# Patient Record
Sex: Male | Born: 1945
Health system: Southern US, Community
[De-identification: ages and names within clinical notes are randomized; demographics above are authoritative.]

## PROBLEM LIST (undated history)

## (undated) DIAGNOSIS — M1990 Unspecified osteoarthritis, unspecified site: Secondary | ICD-10-CM

## (undated) DIAGNOSIS — R042 Hemoptysis: Secondary | ICD-10-CM

## (undated) DIAGNOSIS — J9859 Other diseases of mediastinum, not elsewhere classified: Secondary | ICD-10-CM

## (undated) DIAGNOSIS — B1920 Unspecified viral hepatitis C without hepatic coma: Secondary | ICD-10-CM

## (undated) DIAGNOSIS — I10 Essential (primary) hypertension: Secondary | ICD-10-CM

## (undated) DIAGNOSIS — K219 Gastro-esophageal reflux disease without esophagitis: Secondary | ICD-10-CM

## (undated) DIAGNOSIS — C801 Malignant (primary) neoplasm, unspecified: Secondary | ICD-10-CM

## (undated) DIAGNOSIS — D500 Iron deficiency anemia secondary to blood loss (chronic): Secondary | ICD-10-CM

## (undated) DIAGNOSIS — Z7189 Other specified counseling: Secondary | ICD-10-CM

## (undated) HISTORY — DX: Iron deficiency anemia secondary to blood loss (chronic): D50.0

## (undated) HISTORY — DX: Hypercalcemia: E83.52

## (undated) HISTORY — DX: Other specified counseling: Z71.89

## (undated) HISTORY — DX: Hemoptysis: R04.2

## (undated) HISTORY — DX: Other diseases of mediastinum, not elsewhere classified: J98.59

## (undated) HISTORY — PX: BACK SURGERY: SHX140

---

## 1998-04-01 ENCOUNTER — Emergency Department (HOSPITAL_COMMUNITY): Admission: EM | Admit: 1998-04-01 | Discharge: 1998-04-01 | Payer: Self-pay | Admitting: Emergency Medicine

## 1998-11-01 ENCOUNTER — Emergency Department (HOSPITAL_COMMUNITY): Admission: EM | Admit: 1998-11-01 | Discharge: 1998-11-01 | Payer: Self-pay | Admitting: Emergency Medicine

## 1999-09-03 ENCOUNTER — Emergency Department (HOSPITAL_COMMUNITY): Admission: EM | Admit: 1999-09-03 | Discharge: 1999-09-03 | Payer: Self-pay | Admitting: Emergency Medicine

## 2000-04-27 ENCOUNTER — Ambulatory Visit (HOSPITAL_COMMUNITY): Admission: RE | Admit: 2000-04-27 | Discharge: 2000-04-27 | Payer: Self-pay | Admitting: *Deleted

## 2000-08-12 ENCOUNTER — Encounter: Payer: Self-pay | Admitting: *Deleted

## 2000-08-12 ENCOUNTER — Ambulatory Visit (HOSPITAL_COMMUNITY): Admission: RE | Admit: 2000-08-12 | Discharge: 2000-08-12 | Payer: Self-pay | Admitting: *Deleted

## 2001-08-27 ENCOUNTER — Encounter: Payer: Self-pay | Admitting: Emergency Medicine

## 2001-08-27 ENCOUNTER — Emergency Department (HOSPITAL_COMMUNITY): Admission: EM | Admit: 2001-08-27 | Discharge: 2001-08-27 | Payer: Self-pay | Admitting: Emergency Medicine

## 2001-08-29 ENCOUNTER — Emergency Department (HOSPITAL_COMMUNITY): Admission: EM | Admit: 2001-08-29 | Discharge: 2001-08-29 | Payer: Self-pay | Admitting: Emergency Medicine

## 2003-01-25 ENCOUNTER — Encounter (INDEPENDENT_AMBULATORY_CARE_PROVIDER_SITE_OTHER): Payer: Self-pay | Admitting: *Deleted

## 2003-01-25 ENCOUNTER — Ambulatory Visit (HOSPITAL_COMMUNITY): Admission: RE | Admit: 2003-01-25 | Discharge: 2003-01-25 | Payer: Self-pay | Admitting: Interventional Radiology

## 2004-09-20 ENCOUNTER — Ambulatory Visit: Payer: Self-pay | Admitting: *Deleted

## 2004-09-22 ENCOUNTER — Ambulatory Visit: Payer: Self-pay | Admitting: Family Medicine

## 2004-09-30 ENCOUNTER — Emergency Department (HOSPITAL_COMMUNITY): Admission: EM | Admit: 2004-09-30 | Discharge: 2004-09-30 | Payer: Self-pay | Admitting: Emergency Medicine

## 2004-11-20 ENCOUNTER — Ambulatory Visit: Payer: Self-pay | Admitting: Family Medicine

## 2004-11-20 ENCOUNTER — Ambulatory Visit (HOSPITAL_COMMUNITY): Admission: RE | Admit: 2004-11-20 | Discharge: 2004-11-20 | Payer: Self-pay | Admitting: Internal Medicine

## 2004-11-21 ENCOUNTER — Ambulatory Visit (HOSPITAL_COMMUNITY): Admission: RE | Admit: 2004-11-21 | Discharge: 2004-11-21 | Payer: Self-pay | Admitting: Internal Medicine

## 2004-11-30 ENCOUNTER — Ambulatory Visit: Payer: Self-pay | Admitting: Gastroenterology

## 2005-03-26 ENCOUNTER — Ambulatory Visit: Payer: Self-pay | Admitting: Gastroenterology

## 2005-03-26 ENCOUNTER — Ambulatory Visit: Payer: Self-pay | Admitting: Internal Medicine

## 2005-03-27 ENCOUNTER — Ambulatory Visit: Payer: Self-pay | Admitting: Family Medicine

## 2005-04-26 ENCOUNTER — Ambulatory Visit: Payer: Self-pay | Admitting: Internal Medicine

## 2005-05-17 ENCOUNTER — Ambulatory Visit: Payer: Self-pay | Admitting: Internal Medicine

## 2005-05-31 ENCOUNTER — Ambulatory Visit: Payer: Self-pay | Admitting: Internal Medicine

## 2005-06-01 ENCOUNTER — Ambulatory Visit: Payer: Self-pay | Admitting: Family Medicine

## 2005-06-04 ENCOUNTER — Emergency Department (HOSPITAL_COMMUNITY): Admission: EM | Admit: 2005-06-04 | Discharge: 2005-06-04 | Payer: Self-pay | Admitting: Emergency Medicine

## 2005-06-21 ENCOUNTER — Ambulatory Visit: Payer: Self-pay | Admitting: Gastroenterology

## 2005-06-25 ENCOUNTER — Ambulatory Visit: Payer: Self-pay | Admitting: Family Medicine

## 2005-07-05 ENCOUNTER — Ambulatory Visit: Payer: Self-pay | Admitting: Gastroenterology

## 2005-07-06 ENCOUNTER — Ambulatory Visit (HOSPITAL_COMMUNITY): Admission: RE | Admit: 2005-07-06 | Discharge: 2005-07-06 | Payer: Self-pay | Admitting: Internal Medicine

## 2005-07-31 ENCOUNTER — Ambulatory Visit: Payer: Self-pay | Admitting: Family Medicine

## 2005-08-16 ENCOUNTER — Ambulatory Visit: Payer: Self-pay | Admitting: Gastroenterology

## 2005-08-31 ENCOUNTER — Ambulatory Visit: Payer: Self-pay | Admitting: Internal Medicine

## 2005-09-25 ENCOUNTER — Ambulatory Visit: Payer: Self-pay | Admitting: Family Medicine

## 2005-09-27 ENCOUNTER — Ambulatory Visit: Payer: Self-pay | Admitting: Gastroenterology

## 2005-09-27 ENCOUNTER — Emergency Department (HOSPITAL_COMMUNITY): Admission: EM | Admit: 2005-09-27 | Discharge: 2005-09-27 | Payer: Self-pay | Admitting: Emergency Medicine

## 2005-12-07 ENCOUNTER — Ambulatory Visit: Payer: Self-pay | Admitting: Family Medicine

## 2006-02-28 ENCOUNTER — Ambulatory Visit: Payer: Self-pay | Admitting: Family Medicine

## 2006-04-01 ENCOUNTER — Ambulatory Visit: Payer: Self-pay | Admitting: Family Medicine

## 2006-06-27 ENCOUNTER — Ambulatory Visit: Payer: Self-pay | Admitting: Family Medicine

## 2006-08-05 ENCOUNTER — Encounter: Admission: RE | Admit: 2006-08-05 | Discharge: 2006-08-05 | Payer: Self-pay | Admitting: Otolaryngology

## 2006-08-29 ENCOUNTER — Ambulatory Visit: Payer: Self-pay | Admitting: Family Medicine

## 2006-12-20 ENCOUNTER — Ambulatory Visit: Payer: Self-pay | Admitting: Family Medicine

## 2007-06-13 DIAGNOSIS — B182 Chronic viral hepatitis C: Secondary | ICD-10-CM

## 2007-06-13 DIAGNOSIS — K219 Gastro-esophageal reflux disease without esophagitis: Secondary | ICD-10-CM

## 2007-06-13 DIAGNOSIS — L259 Unspecified contact dermatitis, unspecified cause: Secondary | ICD-10-CM

## 2007-06-13 DIAGNOSIS — K649 Unspecified hemorrhoids: Secondary | ICD-10-CM | POA: Insufficient documentation

## 2007-06-13 DIAGNOSIS — F411 Generalized anxiety disorder: Secondary | ICD-10-CM | POA: Insufficient documentation

## 2007-06-13 DIAGNOSIS — M47817 Spondylosis without myelopathy or radiculopathy, lumbosacral region: Secondary | ICD-10-CM | POA: Insufficient documentation

## 2007-06-13 DIAGNOSIS — IMO0002 Reserved for concepts with insufficient information to code with codable children: Secondary | ICD-10-CM | POA: Insufficient documentation

## 2007-08-21 ENCOUNTER — Ambulatory Visit: Payer: Self-pay | Admitting: Internal Medicine

## 2007-09-22 ENCOUNTER — Encounter (INDEPENDENT_AMBULATORY_CARE_PROVIDER_SITE_OTHER): Payer: Self-pay | Admitting: Internal Medicine

## 2007-09-22 ENCOUNTER — Ambulatory Visit: Payer: Self-pay | Admitting: Family Medicine

## 2007-09-22 LAB — CONVERTED CEMR LAB
ALT: 18 units/L (ref 0–53)
AST: 21 units/L (ref 0–37)
Albumin: 4 g/dL (ref 3.5–5.2)
Alkaline Phosphatase: 87 units/L (ref 39–117)
BUN: 12 mg/dL (ref 6–23)
Basophils Absolute: 0 10*3/uL (ref 0.0–0.1)
Basophils Relative: 0 % (ref 0–1)
CO2: 25 meq/L (ref 19–32)
Calcium: 9.1 mg/dL (ref 8.4–10.5)
Chloride: 104 meq/L (ref 96–112)
Creatinine, Ser: 1.31 mg/dL (ref 0.40–1.50)
Eosinophils Absolute: 0.1 10*3/uL (ref 0.0–0.7)
Eosinophils Relative: 2 % (ref 0–5)
Glucose, Bld: 112 mg/dL — ABNORMAL HIGH (ref 70–99)
HCT: 49.3 % (ref 39.0–52.0)
Hemoglobin: 16.8 g/dL (ref 13.0–17.0)
Lymphocytes Relative: 29 % (ref 12–46)
Lymphs Abs: 1.8 10*3/uL (ref 0.7–3.3)
MCHC: 34.1 g/dL (ref 30.0–36.0)
MCV: 95.2 fL (ref 78.0–100.0)
Monocytes Absolute: 0.6 10*3/uL (ref 0.2–0.7)
Monocytes Relative: 9 % (ref 3–11)
Neutro Abs: 3.7 10*3/uL (ref 1.7–7.7)
Neutrophils Relative %: 59 % (ref 43–77)
Platelets: 200 10*3/uL (ref 150–400)
Potassium: 4.2 meq/L (ref 3.5–5.3)
RBC: 5.18 M/uL (ref 4.22–5.81)
RDW: 14.6 % — ABNORMAL HIGH (ref 11.5–14.0)
Sodium: 139 meq/L (ref 135–145)
Total Bilirubin: 0.5 mg/dL (ref 0.3–1.2)
Total Protein: 8.5 g/dL — ABNORMAL HIGH (ref 6.0–8.3)
WBC: 6.2 10*3/uL (ref 4.0–10.5)

## 2008-05-27 ENCOUNTER — Ambulatory Visit: Payer: Self-pay | Admitting: Internal Medicine

## 2008-05-27 ENCOUNTER — Encounter: Payer: Self-pay | Admitting: Internal Medicine

## 2008-05-27 LAB — CONVERTED CEMR LAB
ALT: 17 units/L (ref 0–53)
AST: 20 units/L (ref 0–37)
Albumin: 4.3 g/dL (ref 3.5–5.2)
Alkaline Phosphatase: 73 units/L (ref 39–117)
BUN: 8 mg/dL (ref 6–23)
Basophils Absolute: 0 10*3/uL (ref 0.0–0.1)
Basophils Relative: 0 % (ref 0–1)
CO2: 21 meq/L (ref 19–32)
Calcium: 9 mg/dL (ref 8.4–10.5)
Chloride: 105 meq/L (ref 96–112)
Creatinine, Ser: 1.07 mg/dL (ref 0.40–1.50)
Eosinophils Absolute: 0.1 10*3/uL (ref 0.0–0.7)
Eosinophils Relative: 1 % (ref 0–5)
Glucose, Bld: 110 mg/dL — ABNORMAL HIGH (ref 70–99)
HCT: 49.6 % (ref 39.0–52.0)
Helicobacter Pylori Antibody-IgG: 1 — ABNORMAL HIGH
Hemoglobin: 16.4 g/dL (ref 13.0–17.0)
Lymphocytes Relative: 22 % (ref 12–46)
Lymphs Abs: 1.7 10*3/uL (ref 0.7–4.0)
MCHC: 33.1 g/dL (ref 30.0–36.0)
MCV: 97.8 fL (ref 78.0–100.0)
Monocytes Absolute: 0.7 10*3/uL (ref 0.1–1.0)
Monocytes Relative: 8 % (ref 3–12)
Neutro Abs: 5.4 10*3/uL (ref 1.7–7.7)
Neutrophils Relative %: 69 % (ref 43–77)
Platelets: 210 10*3/uL (ref 150–400)
Potassium: 4.1 meq/L (ref 3.5–5.3)
RBC: 5.07 M/uL (ref 4.22–5.81)
RDW: 14.4 % (ref 11.5–15.5)
Sodium: 139 meq/L (ref 135–145)
TSH: 1.177 microintl units/mL (ref 0.350–5.50)
Total Bilirubin: 0.5 mg/dL (ref 0.3–1.2)
Total Protein: 9.3 g/dL — ABNORMAL HIGH (ref 6.0–8.3)
Vitamin B-12: 451 pg/mL (ref 211–911)
WBC: 7.9 10*3/uL (ref 4.0–10.5)

## 2008-07-26 ENCOUNTER — Encounter
Admission: RE | Admit: 2008-07-26 | Discharge: 2008-09-20 | Payer: Self-pay | Admitting: Physical Medicine and Rehabilitation

## 2009-01-21 ENCOUNTER — Ambulatory Visit: Payer: Self-pay | Admitting: Internal Medicine

## 2009-03-14 ENCOUNTER — Ambulatory Visit: Payer: Self-pay | Admitting: Internal Medicine

## 2009-03-14 ENCOUNTER — Encounter (INDEPENDENT_AMBULATORY_CARE_PROVIDER_SITE_OTHER): Payer: Self-pay | Admitting: Adult Health

## 2009-03-14 LAB — CONVERTED CEMR LAB
ALT: 23 units/L (ref 0–53)
AST: 25 units/L (ref 0–37)
Albumin: 4.4 g/dL (ref 3.5–5.2)
Alkaline Phosphatase: 79 units/L (ref 39–117)
Amphetamine Screen, Ur: NEGATIVE
BUN: 14 mg/dL (ref 6–23)
Barbiturate Quant, Ur: NEGATIVE
Basophils Absolute: 0 10*3/uL (ref 0.0–0.1)
Basophils Relative: 0 % (ref 0–1)
Benzodiazepines.: NEGATIVE
CO2: 26 meq/L (ref 19–32)
Calcium: 9.7 mg/dL (ref 8.4–10.5)
Chloride: 100 meq/L (ref 96–112)
Cholesterol: 175 mg/dL (ref 0–200)
Cocaine Metabolites: POSITIVE — AB
Creatinine, Ser: 1.24 mg/dL (ref 0.40–1.50)
Creatinine,U: 191.4 mg/dL
Eosinophils Absolute: 0.1 10*3/uL (ref 0.0–0.7)
Eosinophils Relative: 1 % (ref 0–5)
Glucose, Bld: 111 mg/dL — ABNORMAL HIGH (ref 70–99)
HCT: 49.6 % (ref 39.0–52.0)
HDL: 58 mg/dL (ref >39)
Hemoglobin: 16.6 g/dL (ref 13.0–17.0)
LDL Cholesterol: 105 mg/dL — ABNORMAL HIGH (ref 0–99)
Lipase: 42 units/L (ref 0–75)
Lymphocytes Relative: 25 % (ref 12–46)
Lymphs Abs: 2.2 10*3/uL (ref 0.7–4.0)
MCHC: 33.5 g/dL (ref 30.0–36.0)
MCV: 93.8 fL (ref 78.0–100.0)
Marijuana Metabolite: POSITIVE — AB
Methadone: NEGATIVE
Monocytes Absolute: 0.7 10*3/uL (ref 0.1–1.0)
Monocytes Relative: 8 % (ref 3–12)
Neutro Abs: 5.6 10*3/uL (ref 1.7–7.7)
Neutrophils Relative %: 65 % (ref 43–77)
Opiate Screen, Urine: NEGATIVE
PSA: 1.64 ng/mL (ref 0.10–4.00)
Phencyclidine (PCP): NEGATIVE
Platelets: 238 10*3/uL (ref 150–400)
Potassium: 4.3 meq/L (ref 3.5–5.3)
Propoxyphene: NEGATIVE
RBC: 5.29 M/uL (ref 4.22–5.81)
RDW: 12.7 % (ref 11.5–15.5)
Sodium: 135 meq/L (ref 135–145)
Total Bilirubin: 0.5 mg/dL (ref 0.3–1.2)
Total CHOL/HDL Ratio: 3
Total Protein: 9.3 g/dL — ABNORMAL HIGH (ref 6.0–8.3)
Triglycerides: 61 mg/dL (ref <150)
VLDL: 12 mg/dL (ref 0–40)
WBC: 8.6 10*3/uL (ref 4.0–10.5)

## 2009-04-04 ENCOUNTER — Ambulatory Visit: Payer: Self-pay | Admitting: Internal Medicine

## 2009-05-09 ENCOUNTER — Ambulatory Visit: Payer: Self-pay | Admitting: Internal Medicine

## 2009-06-15 ENCOUNTER — Encounter (INDEPENDENT_AMBULATORY_CARE_PROVIDER_SITE_OTHER): Payer: Self-pay | Admitting: Adult Health

## 2009-06-15 ENCOUNTER — Ambulatory Visit: Payer: Self-pay | Admitting: Internal Medicine

## 2009-06-15 LAB — CONVERTED CEMR LAB: Microalb, Ur: 0.5 mg/dL (ref 0.00–1.89)

## 2009-06-21 ENCOUNTER — Ambulatory Visit (HOSPITAL_BASED_OUTPATIENT_CLINIC_OR_DEPARTMENT_OTHER): Admission: RE | Admit: 2009-06-21 | Discharge: 2009-06-22 | Payer: Self-pay | Admitting: Orthopaedic Surgery

## 2009-07-21 ENCOUNTER — Encounter: Admission: RE | Admit: 2009-07-21 | Discharge: 2009-08-08 | Payer: Self-pay | Admitting: Orthopaedic Surgery

## 2009-09-02 ENCOUNTER — Ambulatory Visit: Payer: Self-pay | Admitting: Internal Medicine

## 2009-09-06 ENCOUNTER — Ambulatory Visit (HOSPITAL_COMMUNITY): Admission: RE | Admit: 2009-09-06 | Discharge: 2009-09-06 | Payer: Self-pay | Admitting: Internal Medicine

## 2009-09-15 ENCOUNTER — Encounter: Payer: Self-pay | Admitting: Internal Medicine

## 2009-09-15 ENCOUNTER — Ambulatory Visit (HOSPITAL_COMMUNITY): Admission: RE | Admit: 2009-09-15 | Discharge: 2009-09-15 | Payer: Self-pay | Admitting: Internal Medicine

## 2009-09-26 ENCOUNTER — Ambulatory Visit: Payer: Self-pay | Admitting: Internal Medicine

## 2009-10-20 ENCOUNTER — Ambulatory Visit: Payer: Self-pay | Admitting: Internal Medicine

## 2009-10-21 ENCOUNTER — Emergency Department (HOSPITAL_COMMUNITY): Admission: EM | Admit: 2009-10-21 | Discharge: 2009-10-21 | Payer: Self-pay | Admitting: Emergency Medicine

## 2009-11-23 ENCOUNTER — Ambulatory Visit: Payer: Self-pay | Admitting: Family Medicine

## 2009-12-12 ENCOUNTER — Ambulatory Visit: Payer: Self-pay | Admitting: Family Medicine

## 2009-12-12 ENCOUNTER — Encounter (INDEPENDENT_AMBULATORY_CARE_PROVIDER_SITE_OTHER): Payer: Self-pay | Admitting: Adult Health

## 2009-12-12 LAB — CONVERTED CEMR LAB
ALT: 22 units/L (ref 0–53)
AST: 28 units/L (ref 0–37)
Albumin: 4 g/dL (ref 3.5–5.2)
Alkaline Phosphatase: 70 units/L (ref 39–117)
BUN: 12 mg/dL (ref 6–23)
CO2: 25 meq/L (ref 19–32)
Calcium: 9.1 mg/dL (ref 8.4–10.5)
Chloride: 105 meq/L (ref 96–112)
Cholesterol: 164 mg/dL (ref 0–200)
Creatinine, Ser: 1.19 mg/dL (ref 0.40–1.50)
Glucose, Bld: 113 mg/dL — ABNORMAL HIGH (ref 70–99)
HDL: 58 mg/dL (ref >39)
LDL Cholesterol: 98 mg/dL (ref 0–99)
Potassium: 4.2 meq/L (ref 3.5–5.3)
Sodium: 138 meq/L (ref 135–145)
Total Bilirubin: 0.5 mg/dL (ref 0.3–1.2)
Total CHOL/HDL Ratio: 2.8
Total Protein: 8.1 g/dL (ref 6.0–8.3)
Triglycerides: 40 mg/dL (ref <150)
VLDL: 8 mg/dL (ref 0–40)

## 2009-12-21 ENCOUNTER — Encounter (INDEPENDENT_AMBULATORY_CARE_PROVIDER_SITE_OTHER): Payer: Self-pay | Admitting: *Deleted

## 2010-05-19 ENCOUNTER — Ambulatory Visit: Payer: Self-pay | Admitting: Family Medicine

## 2010-05-20 ENCOUNTER — Encounter (INDEPENDENT_AMBULATORY_CARE_PROVIDER_SITE_OTHER): Payer: Self-pay | Admitting: Adult Health

## 2010-05-20 LAB — CONVERTED CEMR LAB: Microalb, Ur: 0.65 mg/dL (ref 0.00–1.89)

## 2010-05-31 ENCOUNTER — Ambulatory Visit (HOSPITAL_COMMUNITY): Admission: RE | Admit: 2010-05-31 | Discharge: 2010-05-31 | Payer: Self-pay | Admitting: Adult Health

## 2010-10-11 ENCOUNTER — Ambulatory Visit (HOSPITAL_COMMUNITY): Admission: RE | Admit: 2010-10-11 | Discharge: 2010-10-11 | Payer: Self-pay | Admitting: Family Medicine

## 2011-04-05 LAB — COMPREHENSIVE METABOLIC PANEL
ALT: 24 U/L (ref 0–53)
AST: 37 U/L (ref 0–37)
Albumin: 4.2 g/dL (ref 3.5–5.2)
Alkaline Phosphatase: 74 U/L (ref 39–117)
BUN: 9 mg/dL (ref 6–23)
CO2: 25 mEq/L (ref 19–32)
Calcium: 9.4 mg/dL (ref 8.4–10.5)
Chloride: 101 mEq/L (ref 96–112)
Creatinine, Ser: 1.6 mg/dL — ABNORMAL HIGH (ref 0.4–1.5)
GFR calc Af Amer: 53 mL/min — ABNORMAL LOW (ref >60)
GFR calc non Af Amer: 44 mL/min — ABNORMAL LOW (ref >60)
Glucose, Bld: 102 mg/dL — ABNORMAL HIGH (ref 70–99)
Potassium: 5.4 mEq/L — ABNORMAL HIGH (ref 3.5–5.1)
Sodium: 134 mEq/L — ABNORMAL LOW (ref 135–145)
Total Bilirubin: 1.4 mg/dL — ABNORMAL HIGH (ref 0.3–1.2)
Total Protein: 9.2 g/dL — ABNORMAL HIGH (ref 6.0–8.3)

## 2011-04-05 LAB — CBC
HCT: 51 % (ref 39.0–52.0)
Hemoglobin: 17.7 g/dL — ABNORMAL HIGH (ref 13.0–17.0)
MCHC: 34.7 g/dL (ref 30.0–36.0)
MCV: 99 fL (ref 78.0–100.0)
Platelets: 209 10*3/uL (ref 150–400)
RBC: 5.16 MIL/uL (ref 4.22–5.81)
RDW: 13 % (ref 11.5–15.5)
WBC: 9 10*3/uL (ref 4.0–10.5)

## 2011-04-05 LAB — DIFFERENTIAL
Basophils Absolute: 0.1 10*3/uL (ref 0.0–0.1)
Basophils Relative: 1 % (ref 0–1)
Eosinophils Absolute: 0.2 10*3/uL (ref 0.0–0.7)
Eosinophils Relative: 2 % (ref 0–5)
Lymphocytes Relative: 20 % (ref 12–46)
Lymphs Abs: 1.8 10*3/uL (ref 0.7–4.0)
Monocytes Absolute: 0.9 10*3/uL (ref 0.1–1.0)
Monocytes Relative: 10 % (ref 3–12)
Neutro Abs: 6.1 10*3/uL (ref 1.7–7.7)
Neutrophils Relative %: 68 % (ref 43–77)

## 2011-04-09 LAB — COMPREHENSIVE METABOLIC PANEL
ALT: 28 U/L (ref 0–53)
AST: 38 U/L — ABNORMAL HIGH (ref 0–37)
Albumin: 3.8 g/dL (ref 3.5–5.2)
Alkaline Phosphatase: 68 U/L (ref 39–117)
BUN: 11 mg/dL (ref 6–23)
CO2: 26 mEq/L (ref 19–32)
Calcium: 9.2 mg/dL (ref 8.4–10.5)
Chloride: 103 mEq/L (ref 96–112)
Creatinine, Ser: 1.41 mg/dL (ref 0.4–1.5)
GFR calc Af Amer: >60 mL/min (ref >60)
GFR calc non Af Amer: 51 mL/min — ABNORMAL LOW (ref >60)
Glucose, Bld: 128 mg/dL — ABNORMAL HIGH (ref 70–99)
Potassium: 4.7 mEq/L (ref 3.5–5.1)
Sodium: 135 mEq/L (ref 135–145)
Total Bilirubin: 0.6 mg/dL (ref 0.3–1.2)
Total Protein: 8.4 g/dL — ABNORMAL HIGH (ref 6.0–8.3)

## 2011-04-09 LAB — POCT HEMOGLOBIN-HEMACUE: Hemoglobin: 16.5 g/dL (ref 13.0–17.0)

## 2011-05-15 NOTE — Op Note (Signed)
NAME:  Ryan Houston, Ryan Houston NO.:  [id number]   MEDICAL RECORD NO.:  [id number]          PATIENT TYPE:  AMB   LOCATION:  DSC                          FACILITY:  [facility]   PHYSICIAN:  Lubertha Basque. Dalldorf, M.D.DATE OF BIRTH:  Aug 27, 1946   DATE OF PROCEDURE:  06/21/2009  DATE OF DISCHARGE:                               OPERATIVE REPORT   PREOPERATIVE DIAGNOSES:  1. Right shoulder impingement.  2. Right shoulder acromioclavicular degeneration.  3. Right shoulder massive rotator cuff tear.   POSTOPERATIVE DIAGNOSES:  1. Right shoulder impingement.  2. Right shoulder acromioclavicular degeneration.  3. Right shoulder massive rotator cuff tear.   PROCEDURES:  1. Right shoulder arthroscopic acromioplasty.  2. Right shoulder arthroscopic acromioclavicular resection.  3. Right shoulder arthroscopic cuff debridement.   ANESTHESIA:  General and block.   ATTENDING SURGEON:  Lubertha Basque. Jerl Santos, MD   INDICATIONS FOR PROCEDURE:  The patient is a 65 year old man with a long  history of right shoulder difficulty.  This has persisted despite oral  anti-inflammatories and injections.  He has a painful arc of motion and  pain which limits his ability to rest and use his arm.  By MRI scan, he  has a large rotator cuff tear and unfortunately, he has fatty  infiltration and atrophy of the cuff musculature.  He is offered an  arthroscopy.  Informed operative consent was obtained after discussion  of possible complications including reaction to anesthesia and  infection.  The importance of postoperative physical therapy was  stressed with the patient in hopes of optimizing his result.   SUMMARY, FINDINGS, AND PROCEDURE:  Under general anesthesia and a block,  a right shoulder arthroscopy was performed.  Glenohumeral joint showed  no degenerative changes, and the biceps tendon was intact.  He had a  massive rotator cuff tear, which was difficult to get anywhere near the  greater tuberosity.   With his MRI findings of fatty infiltration, we  elected to debride and I took this structure back towards the glenoid  maintaining anterior and posterior sleeves of tissue.  He had a  prominent subacromial morphology, which I addressed with a conservative  acromioplasty.  He also had bone-on-bone contact the [facility] joint and a  formal AC resection was done.   DESCRIPTION OF PROCEDURE:  The patient was taken to the operating suite  where general anesthetic was applied without difficulty.  He was also  given a block in the preanesthesia area.  He was positioned in a beach-  chair position and prepped and draped in normal sterile fashion.  After  administration of IV Kefzol, an arthroscopy of the right shoulder was  performed through a total of 3 portals.  Findings were as noted above  and procedure consisted of the acromioplasty initially.  This was done  with a bur in the lateral position followed by transfer of the bur in  the posterior position.  I then performed a formal AC resection mostly  through the anterior portal.  This did leave him with about a centimeter  space between the distal clavicle and acromion.  The rotator cuff  as  mentioned above was torn involving all the infraspinatus and  supraspinatus.  This was retracted and difficult to reapproximate.  The  tissues were poor and we elected to debride back towards the glenoid,  but we did maintain anterior and posterior sleeves of tissue.  The  shoulder was thoroughly irrigated followed by reapproximation of portals  loosely with nylon.  Adaptic was applied followed by dry gauze and tape.  Estimated blood loss and intraoperative fluids can be obtained from  anesthesia records   DISPOSITION:  The patient was extubated in the operating room and taken  to recovery room in stable addition.  He is to stay overnight for pain  control and observation with probable discharge home in the morning.      Lubertha Basque Jerl Santos, M.D.   Electronically Signed     PGD/MEDQ  D:  06/21/2009  T:  06/22/2009  Job:  [id number]

## 2012-07-05 ENCOUNTER — Emergency Department (HOSPITAL_COMMUNITY): Payer: Medicare Other

## 2012-07-05 ENCOUNTER — Encounter (HOSPITAL_COMMUNITY): Payer: Self-pay

## 2012-07-05 ENCOUNTER — Emergency Department (HOSPITAL_COMMUNITY)
Admission: EM | Admit: 2012-07-05 | Discharge: 2012-07-05 | Disposition: A | Discharge status: Home / Self Care | Payer: Medicare Other | Attending: Emergency Medicine | Admitting: Emergency Medicine

## 2012-07-05 DIAGNOSIS — I10 Essential (primary) hypertension: Secondary | ICD-10-CM | POA: Insufficient documentation

## 2012-07-05 DIAGNOSIS — K219 Gastro-esophageal reflux disease without esophagitis: Secondary | ICD-10-CM | POA: Insufficient documentation

## 2012-07-05 DIAGNOSIS — F17200 Nicotine dependence, unspecified, uncomplicated: Secondary | ICD-10-CM | POA: Insufficient documentation

## 2012-07-05 DIAGNOSIS — R05 Cough: Secondary | ICD-10-CM | POA: Insufficient documentation

## 2012-07-05 DIAGNOSIS — R059 Cough, unspecified: Secondary | ICD-10-CM | POA: Insufficient documentation

## 2012-07-05 DIAGNOSIS — M129 Arthropathy, unspecified: Secondary | ICD-10-CM | POA: Insufficient documentation

## 2012-07-05 HISTORY — DX: Unspecified osteoarthritis, unspecified site: M19.90

## 2012-07-05 HISTORY — DX: Essential (primary) hypertension: I10

## 2012-07-05 HISTORY — DX: Gastro-esophageal reflux disease without esophagitis: K21.9

## 2012-07-05 MED ORDER — AZITHROMYCIN 250 MG PO TABS: ORAL_TABLET | ORAL | Status: AC

## 2012-07-05 MED ORDER — ALBUTEROL SULFATE HFA 108 (90 BASE) MCG/ACT IN AERS: 1.0000 | INHALATION_SPRAY | Freq: Four times a day (QID) | RESPIRATORY_TRACT | Status: DC | PRN

## 2012-07-05 MED ORDER — PREDNISONE 10 MG PO TABS: 20.0000 mg | ORAL_TABLET | Freq: Every day | ORAL | Status: DC

## 2012-07-05 NOTE — ED Provider Notes (Signed)
History     CSN: [id number]  Arrival date & time 07/05/12  3244   First MD Initiated Contact with Patient 07/05/12 [phone]      Chief Complaint  Patient presents with  . Cough  . Chills  . Fever    (Consider location/radiation/quality/duration/timing/severity/associated sxs/prior treatment) Patient is a 66 y.o. male presenting with cough and fever. The history is provided by the patient.  Cough  Fever Primary symptoms of the febrile illness include fever and cough.   patient with productive cough of green yellow sputum x2 weeks. Some chills and low-grade temperature. Denies any hemoptysis. Does note some weight loss recently. No vomiting or diarrhea. No chest pain or chest pressure. Does smoke cigarettes daily. Denies any wheezing. Nothing makes his symptoms better worse and no treatment done prior to arrival  Past Medical History  Diagnosis Date  . Arthritis   . Hypertension   . GERD (gastroesophageal reflux disease)     Past Surgical History  Procedure Date  . Back surgery     No family history on file.  History  Substance Use Topics  . Smoking status: Current Everyday Smoker  . Smokeless tobacco: Not on file  . Alcohol Use: Yes      Review of Systems  Constitutional: Positive for fever.  Respiratory: Positive for cough.   All other systems reviewed and are negative.    Allergies  Review of patient's allergies indicates no known allergies.  Home Medications   Current Outpatient Rx  Name Route Sig Dispense Refill  . ADULT MULTIVITAMIN W/MINERALS CH Oral Take 1 tablet by mouth daily.      BP 140/74  Pulse 84  Temp 98.6 F (37 C) (Oral)  Resp 16  SpO2 99%  Physical Exam  Nursing note and vitals reviewed. Constitutional: He is oriented to person, place, and time. He appears well-developed and well-nourished.  Non-toxic appearance. No distress.  HENT:  Head: Normocephalic and atraumatic.  Eyes: Conjunctivae, EOM and lids are normal. Pupils are  equal, round, and reactive to light.  Neck: Normal range of motion. Neck supple. No tracheal deviation present. No mass present.  Cardiovascular: Normal rate, regular rhythm and normal heart sounds.  Exam reveals no gallop.   No murmur heard. Pulmonary/Chest: Effort normal and breath sounds normal. No stridor. No respiratory distress. He has no decreased breath sounds. He has no wheezes. He has no rhonchi. He has no rales.  Abdominal: Soft. Normal appearance and bowel sounds are normal. He exhibits no distension. There is no tenderness. There is no rebound and no CVA tenderness.  Musculoskeletal: Normal range of motion. He exhibits no edema and no tenderness.  Neurological: He is alert and oriented to person, place, and time. He has normal strength. No cranial nerve deficit or sensory deficit. GCS eye subscore is 4. GCS verbal subscore is 5. GCS motor subscore is 6.  Skin: Skin is warm and dry. No abrasion and no rash noted.  Psychiatric: He has a normal mood and affect. His speech is normal and behavior is normal.    ED Course  Procedures (including critical care time)  Labs Reviewed - No data to display No results found.   No diagnosis found.    MDM  Patient to be treated for presumptive bronchitis        Toy Baker, MD 07/05/12 1004

## 2012-07-05 NOTE — ED Notes (Signed)
Pt in from home states productive cough (greenish/yellow) x2 weeks with chills and fever states pain in chest with cough

## 2012-12-06 ENCOUNTER — Emergency Department (HOSPITAL_COMMUNITY)
Admission: EM | Admit: 2012-12-06 | Discharge: 2012-12-06 | Disposition: A | Discharge status: Home / Self Care | Payer: Medicare Other | Attending: Emergency Medicine | Admitting: Emergency Medicine

## 2012-12-06 ENCOUNTER — Encounter (HOSPITAL_COMMUNITY): Payer: Self-pay | Admitting: *Deleted

## 2012-12-06 DIAGNOSIS — Z760 Encounter for issue of repeat prescription: Secondary | ICD-10-CM | POA: Insufficient documentation

## 2012-12-06 DIAGNOSIS — I10 Essential (primary) hypertension: Secondary | ICD-10-CM | POA: Insufficient documentation

## 2012-12-06 DIAGNOSIS — L299 Pruritus, unspecified: Secondary | ICD-10-CM

## 2012-12-06 DIAGNOSIS — Z8739 Personal history of other diseases of the musculoskeletal system and connective tissue: Secondary | ICD-10-CM | POA: Insufficient documentation

## 2012-12-06 DIAGNOSIS — K219 Gastro-esophageal reflux disease without esophagitis: Secondary | ICD-10-CM | POA: Insufficient documentation

## 2012-12-06 DIAGNOSIS — Z79899 Other long term (current) drug therapy: Secondary | ICD-10-CM | POA: Insufficient documentation

## 2012-12-06 DIAGNOSIS — F17200 Nicotine dependence, unspecified, uncomplicated: Secondary | ICD-10-CM | POA: Insufficient documentation

## 2012-12-06 MED ORDER — DEXAMETHASONE SODIUM PHOSPHATE 10 MG/ML IJ SOLN
10.0000 mg | Freq: Once | INTRAMUSCULAR | Status: AC
  Administered 2012-12-06: 10 mg via INTRAVENOUS
  Filled 2012-12-06: qty 1

## 2012-12-06 MED ORDER — HALOBETASOL PROPIONATE 0.05 % EX CREA: TOPICAL_CREAM | Freq: Two times a day (BID) | CUTANEOUS | Status: DC

## 2012-12-06 MED ORDER — HYDROXYZINE HCL 25 MG PO TABS: 25.0000 mg | ORAL_TABLET | Freq: Four times a day (QID) | ORAL | Status: DC

## 2012-12-06 MED ORDER — OMEPRAZOLE 20 MG PO CPDR: 20.0000 mg | DELAYED_RELEASE_CAPSULE | Freq: Every day | ORAL | Status: DC

## 2012-12-06 NOTE — ED Notes (Signed)
Pt reports being a [facility] pt and having chronic puritis, hives and rashes. Sts he has run out of his medication and is here for a refill until he can get another dr.

## 2012-12-06 NOTE — ED Provider Notes (Signed)
History     CSN: [id number]  Arrival date & time 12/06/12  4098   First MD Initiated Contact with Patient 12/06/12 [phone]      Chief Complaint  Patient presents with  . Medication Refill  . Pruritis    (Consider location/radiation/quality/duration/timing/severity/associated sxs/prior treatment) HPI Comments: This is a 66 year old male, who presents to the ED with a chief complaint of itching. He states that this is a chronic condition, and that he has run out of his prescription medications. He states that [facility] at [facility], but he has been unable to be seen since they closed. He requests a refill of his medications.  States that his itching has been severe since running out of his medications for the past 2 days.  He has tried olive oil, with no relief.  The itching is persistent.  Patient has seen several dermatologists for his condition, but it remains undiagnosed.  The history is provided by the patient. No language interpreter was used.    Past Medical History  Diagnosis Date  . Arthritis   . Hypertension   . GERD (gastroesophageal reflux disease)     Past Surgical History  Procedure Date  . Back surgery     No family history on file.  History  Substance Use Topics  . Smoking status: Current Every Day Smoker  . Smokeless tobacco: Not on file  . Alcohol Use: Yes     Comment: beer a day      Review of Systems  All other systems reviewed and are negative.    Allergies  Review of patient's allergies indicates no known allergies.  Home Medications   Current Outpatient Rx  Name  Route  Sig  Dispense  Refill  . ALBUTEROL SULFATE HFA 108 (90 BASE) MCG/ACT IN AERS   Inhalation   Inhale 1-2 puffs into the lungs every 6 (six) hours as needed for wheezing.   1 Inhaler   0   . FLUTICASONE PROPIONATE 50 MCG/ACT NA SUSP   Nasal   Place 1 spray into the nose daily.         Marland Kitchen HALOBETASOL PROPIONATE 0.05 % EX CREA   Topical   Apply 1 application  topically 2 (two) times daily.         Marland Kitchen HYDROXYZINE PAMOATE 50 MG PO CAPS   Oral   Take 50 mg by mouth. itching         . IBUPROFEN 800 MG PO TABS   Oral   Take 800 mg by mouth 3 (three) times daily with meals. Pain         . OXYCODONE-ACETAMINOPHEN 5-325 MG PO TABS   Oral   Take 1 tablet by mouth 2 (two) times daily. Pain           BP 135/86  Pulse 83  Temp 98 F (36.7 C) (Oral)  Resp 18  SpO2 96%  Physical Exam  Nursing note and vitals reviewed. Constitutional: He is oriented to person, place, and time. He appears well-developed and well-nourished.  HENT:  Head: Normocephalic and atraumatic.  Eyes: Conjunctivae normal and EOM are normal.  Neck: Normal range of motion.  Cardiovascular: Normal rate, regular rhythm and normal heart sounds.  Exam reveals no gallop and no friction rub.   No murmur heard. Pulmonary/Chest: Effort normal and breath sounds normal. No respiratory distress. He has no wheezes. He has no rales. He exhibits no tenderness.  Abdominal: He exhibits no distension.  Musculoskeletal: Normal range  of motion.  Neurological: He is alert and oriented to person, place, and time.  Skin: Skin is warm and dry. Rash noted.       Several locations of scattered hives,   Psychiatric: He has a normal mood and affect. His behavior is normal. Judgment and thought content normal.    ED Course  Procedures (including critical care time)  Labs Reviewed - No data to display No results found.   1. Pruritus   2. GERD (gastroesophageal reflux disease)       MDM  66 year old male who presents for medication refill.  He has chronic undiagnosed pruritus, and GERD.  I will refill his medications and refer him to [facility].  Patient states that he is trying to become established after [facility] closed.  I have discussed the plan with the patient, he understands and agrees.  Patient is stable and ready for discharge.       Roxy Horseman,  PA-C 12/06/12 [phone]

## 2012-12-09 NOTE — ED Provider Notes (Signed)
Medical screening examination/treatment/procedure(s) were performed by non-physician practitioner and as supervising physician I was immediately available for consultation/collaboration.  Malon Branton T Amalea Ottey, MD 12/09/12 1631 

## 2013-10-12 ENCOUNTER — Other Ambulatory Visit (HOSPITAL_COMMUNITY): Payer: Self-pay | Admitting: Internal Medicine

## 2013-10-12 ENCOUNTER — Ambulatory Visit (HOSPITAL_COMMUNITY)
Admission: RE | Admit: 2013-10-12 | Discharge: 2013-10-12 | Disposition: A | Discharge status: Home / Self Care | Payer: Medicaid Other | Source: Ambulatory Visit | Attending: Internal Medicine | Admitting: Internal Medicine

## 2013-10-12 DIAGNOSIS — J449 Chronic obstructive pulmonary disease, unspecified: Secondary | ICD-10-CM | POA: Insufficient documentation

## 2013-10-12 DIAGNOSIS — R05 Cough: Secondary | ICD-10-CM

## 2013-10-12 DIAGNOSIS — J8410 Pulmonary fibrosis, unspecified: Secondary | ICD-10-CM | POA: Insufficient documentation

## 2013-10-12 DIAGNOSIS — J4489 Other specified chronic obstructive pulmonary disease: Secondary | ICD-10-CM | POA: Insufficient documentation

## 2015-09-27 ENCOUNTER — Telehealth: Payer: Self-pay | Admitting: Lab

## 2015-09-27 NOTE — Telephone Encounter (Signed)
LM for pt to call the office-He needs Lab appmt

## 2015-10-31 NOTE — Telephone Encounter (Signed)
Pt given appmt to have required labs drawn on 11/09/2015 @ 1130.  He will get an appmt to see Dr. Linus Salmons at that time

## 2015-11-09 ENCOUNTER — Other Ambulatory Visit: Payer: Medicaid Other

## 2015-11-09 DIAGNOSIS — B182 Chronic viral hepatitis C: Secondary | ICD-10-CM

## 2015-11-09 LAB — CBC WITH DIFFERENTIAL/PLATELET
BASOS ABS: 0 10*3/uL (ref 0.0–0.1)
Basophils Relative: 0 % (ref 0–1)
EOS ABS: 0.1 10*3/uL (ref 0.0–0.7)
Eosinophils Relative: 1 % (ref 0–5)
HCT: 42.6 % (ref 39.0–52.0)
Hemoglobin: 14.2 g/dL (ref 13.0–17.0)
LYMPHS ABS: 2.8 10*3/uL (ref 0.7–4.0)
LYMPHS PCT: 39 % (ref 12–46)
MCH: 32.3 pg (ref 26.0–34.0)
MCHC: 33.3 g/dL (ref 30.0–36.0)
MCV: 97 fL (ref 78.0–100.0)
MPV: 10.8 fL (ref 8.6–12.4)
Monocytes Absolute: 0.7 10*3/uL (ref 0.1–1.0)
Monocytes Relative: 10 % (ref 3–12)
NEUTROS PCT: 50 % (ref 43–77)
Neutro Abs: 3.7 10*3/uL (ref 1.7–7.7)
Platelets: 156 10*3/uL (ref 150–400)
RBC: 4.39 MIL/uL (ref 4.22–5.81)
RDW: 13.4 % (ref 11.5–15.5)
WBC: 7.3 10*3/uL (ref 4.0–10.5)

## 2015-11-10 LAB — COMPREHENSIVE METABOLIC PANEL
ALK PHOS: 113 U/L (ref 40–115)
ALT: 128 U/L — AB (ref 9–46)
AST: 148 U/L — ABNORMAL HIGH (ref 10–35)
Albumin: 3.6 g/dL (ref 3.6–5.1)
BILIRUBIN TOTAL: 0.8 mg/dL (ref 0.2–1.2)
BUN: 11 mg/dL (ref 7–25)
CO2: 23 mmol/L (ref 20–31)
Calcium: 9.4 mg/dL (ref 8.6–10.3)
Chloride: 100 mmol/L (ref 98–110)
Creat: 1.31 mg/dL — ABNORMAL HIGH (ref 0.70–1.25)
Glucose, Bld: 236 mg/dL — ABNORMAL HIGH (ref 65–99)
Potassium: 4.1 mmol/L (ref 3.5–5.3)
Sodium: 135 mmol/L (ref 135–146)
TOTAL PROTEIN: 9 g/dL — AB (ref 6.1–8.1)

## 2015-11-10 LAB — HEPATITIS A ANTIBODY, TOTAL: Hep A Total Ab: REACTIVE — AB

## 2015-11-10 LAB — PROTIME-INR
INR: 1.09 (ref <1.50)
PROTHROMBIN TIME: 14.2 s (ref 11.6–15.2)

## 2015-11-10 LAB — HEPATITIS B SURFACE ANTIGEN: HEP B S AG: NEGATIVE

## 2015-11-10 LAB — HIV ANTIBODY (ROUTINE TESTING W REFLEX): HIV: NONREACTIVE

## 2015-11-10 LAB — HEPATITIS B SURFACE ANTIBODY,QUALITATIVE: Hep B S Ab: NEGATIVE

## 2015-11-10 LAB — HEPATITIS B CORE ANTIBODY, TOTAL: Hep B Core Total Ab: REACTIVE — AB

## 2015-11-10 LAB — ANA: Anti Nuclear Antibody(ANA): NEGATIVE

## 2015-11-10 LAB — IRON: Iron: 264 ug/dL — ABNORMAL HIGH (ref 50–180)

## 2015-11-10 NOTE — Telephone Encounter (Signed)
Pt came in on 11/09/2015 to get labs required done and was given an appmt with Dr. Linus Salmons on 11/30/2015 @ 245-PCP's office notified Lorre Munroe).

## 2015-11-11 LAB — HEPATITIS C RNA QUANTITATIVE
HCV QUANT LOG: 7.1 {Log} — AB (ref <1.18)
HCV Quantitative: 12673555 IU/mL — ABNORMAL HIGH (ref <15)

## 2015-11-18 LAB — HEPATITIS C GENOTYPE

## 2015-11-30 ENCOUNTER — Encounter: Payer: Medicaid Other | Admitting: Internal Medicine

## 2015-11-30 NOTE — Telephone Encounter (Signed)
PT was a NO SHOW today-memory issues.  R/S for 12/14/15 @ 940-will call pt 1 wk ahead and 2 days ahead-I also had to update phone number through primary care Dr.  I called pt last week but couldn't reach him

## 2015-12-14 ENCOUNTER — Encounter: Payer: Self-pay | Admitting: Internal Medicine

## 2015-12-14 ENCOUNTER — Ambulatory Visit (INDEPENDENT_AMBULATORY_CARE_PROVIDER_SITE_OTHER): Payer: Medicare Other | Admitting: Internal Medicine

## 2015-12-14 VITALS — BP 133/68 | HR 91 | Temp 97.9°F | Wt 148.0 lb

## 2015-12-14 DIAGNOSIS — Z23 Encounter for immunization: Secondary | ICD-10-CM

## 2015-12-14 DIAGNOSIS — B182 Chronic viral hepatitis C: Secondary | ICD-10-CM

## 2015-12-14 MED ORDER — LEDIPASVIR-SOFOSBUVIR 90-400 MG PO TABS: 1.0000 | ORAL_TABLET | Freq: Every day | ORAL | Status: DC

## 2015-12-14 MED ORDER — RIBAVIRIN 200 MG PO TABS: 1200.0000 mg/d | ORAL_TABLET | Freq: Two times a day (BID) | ORAL | Status: DC

## 2015-12-14 NOTE — Patient Instructions (Signed)
Date 12/14/2015  Dear Mr Knisley, As discussed in the [facility], your hepatitis C therapy will include the following medications:          Harvoni '90mg'$ /'400mg'$  tablet:           Take 1 tablet by mouth once daily                                   +      Ribavirin 600 mg twice a day  Please note that ALL MEDICATIONS WILL START ON THE SAME DATE for a total of 12 weeks. ---------------------------------------------------------------- Your HCV Treatment Start Date: TBA   Your HCV genotype:  1a    Liver Fibrosis: TBD    ---------------------------------------------------------------- YOUR PHARMACY CONTACT:   [place] [facility] [place] Phone: [phone] Hours: Monday to Friday 7:30 am to 6:00 pm   Please always contact your pharmacy at least 3-4 business days before you run out of medications to ensure your next month's medication is ready or 1 week prior to running out if you receive it by mail.  Remember, each prescription is for 28 days. ---------------------------------------------------------------- GENERAL NOTES REGARDING YOUR HEPATITIS C MEDICATION:  SOFOSBUVIR/LEDIPASVIR (HARVONI): - Harvoni tablet is taken daily with OR without food. - The tablets are orange. - The tablets should be stored at room temperature.  - Acid reducing agents such as H2 blockers (ie. Pepcid (famotidine), Zantac (ranitidine), Tagamet (cimetidine), Axid (nizatidine) and proton pump inhibitors (ie. Prilosec (omeprazole), Protonix (pantoprazole), Nexium (esomeprazole), or Aciphex (rabeprazole)) can decrease effectiveness of Harvoni. Do not take until you have discussed with a health care provider.    -Antacids that contain magnesium and/or aluminum hydroxide (ie. Milk of Magensia, Rolaids, Gaviscon, Maalox, Mylanta, an dArthritis Pain Formula)can reduce absorption of Harvoni, so take them at least 4 hours before or after  Harvoni.  -Calcium carbonate (calcium supplements or antacids such as Tums, Caltrate, Os-Cal)needs to be taken at least 4 hours hours before or after Harvoni.  -St. John's wort or any products that contain St. John's wort like some herbal supplements  Please inform the office prior to starting any of these medications.  - The common side effects associated with Harvoni include:      1. Fatigue      2. Headache      3. Nausea      4. Diarrhea      5. Insomnia  Please note that this only lists the most common side effects and is NOT a comprehensive list of the potential side effects of these medications. For more information, please review the drug information sheets that come with your medication package from the pharmacy.  ---------------------------------------------------------------- GENERAL HELPFUL HINTS ON HCV THERAPY: 1. Stay well-hydrated. 2. Notify the [facility] of any changes in your other over-the-counter/herbal or prescription medications. 3. If you miss a dose of your medication, take the missed dose as soon as you remember. Return to your regular time/dose schedule the next day.  4.  Do not stop taking your medications without first talking with your healthcare provider. 5.  You may take Tylenol (acetaminophen), as long as the dose is less than 2000 mg (OR no more than 4 tablets of the Tylenol Extra Strengths '500mg'$  tablet) in 24 hours. 6.  You will see our pharmacist-specialist within the first 2 weeks of starting your medication. 7.  You  will need to obtain routine labs around week 4 and12 weeks after starting and then 3 to 6 months after finishing Harvoni.    Scharlene Gloss, [place] for [employer] [place], [place]  [phone]

## 2015-12-14 NOTE — Progress Notes (Addendum)
[place] for Infectious Disease   CC: consideration for treatment for chronic hepatitis C  HPI:  +Ryan Houston is a 69 y.o. male who presents for initial evaluation and management of chronic hepatitis C.  Patient tested positive over 10 years ago. Hepatitis C-associated risk factors present are: none. Patient denies history of blood transfusion, intranasal drug use, IV drug abuse, sexual contact with person with liver disease. Patient has had other studies performed. Results: hepatitis C RNA by PCR, result: positive. Patient has had prior treatment for Hepatitis C. Patient does not have a past history of liver disease. Patient does not have a family history of liver disease. Patient does not  have associated signs or symptoms related to liver disease.  Labs reviewed and confirm chronic hepatitis C with a positive viral load.   He has a history of treatement with Peg/riba in 2004 and treated for about 6 months, could not continue due to side effects.  He does not recall biopsy results at that time.      Patient does have documented immunity to Hepatitis A. Patient does have documented immunity to Hepatitis B.    Review of Systems:   Constitutional: negative for fatigue and malaise Gastrointestinal: negative for diarrhea Musculoskeletal: negative for myalgias and arthralgias All other systems reviewed and are negative      Past Medical History  Diagnosis Date  . Arthritis   . Hypertension   . GERD (gastroesophageal reflux disease)     Prior to Admission medications   Medication Sig Start Date End Date Taking? Authorizing Provider  Cholecalciferol (VITAMIN D-3) 1000 UNITS CAPS Take by mouth.   Yes Historical Provider, MD  fluticasone (FLONASE) 50 MCG/ACT nasal spray Place 1 spray into the nose daily.   Yes Historical Provider, MD  halobetasol (ULTRAVATE) 0.05 % cream Apply 1 application topically 2 (two) times daily.   Yes Historical Provider, MD  hydrOXYzine  (ATARAX/VISTARIL) 25 MG tablet Take 1 tablet (25 mg total) by mouth every 6 (six) hours. 12/06/12  Yes Montine Circle, PA-C  ibuprofen (ADVIL,MOTRIN) 800 MG tablet Take 800 mg by mouth 3 (three) times daily with meals. Pain   Yes Historical Provider, MD  mirabegron ER (MYRBETRIQ) 25 MG TB24 tablet Take 25 mg by mouth daily.   Yes Historical Provider, MD  montelukast (SINGULAIR) 10 MG tablet Take 10 mg by mouth at bedtime.   Yes Historical Provider, MD  omeprazole (PRILOSEC) 20 MG capsule Take 1 capsule (20 mg total) by mouth daily. 12/06/12  Yes Montine Circle, PA-C  oxyCODONE-acetaminophen (PERCOCET) 5-325 MG per tablet Take 1 tablet by mouth 2 (two) times daily. Pain   Yes Historical Provider, MD  albuterol (PROVENTIL HFA;VENTOLIN HFA) 108 (90 BASE) MCG/ACT inhaler Inhale 1-2 puffs into the lungs every 6 (six) hours as needed for wheezing. 07/05/12 07/05/13  Lacretia Leigh, MD  Ledipasvir-Sofosbuvir (HARVONI) 90-400 MG TABS Take 1 tablet by mouth daily. 12/14/15   Thayer Headings, MD  ribavirin (COPEGUS) 200 MG tablet Take 3 tablets (600 mg total) by mouth 2 (two) times daily. 12/14/15   Thayer Headings, MD    No Known Allergies  Social History  Substance Use Topics  . Smoking status: Current Every Day Smoker -- 1.00 packs/day    Types: Cigarettes  . Smokeless tobacco: Never Used  . Alcohol Use: 0.0 oz/week    0 Standard drinks or equivalent per week     Comment: beer a day    FMHX: no liver cirrhosis,  no liver cancer   Objective:  Constitutional: in no apparent distress and alert,  Filed Vitals:   12/14/15 1009  BP: 133/68  Pulse: 91  Temp: 97.9 F (36.6 C)   Eyes: anicteric Cardiovascular: Cor RRR and No murmurs Respiratory: CTAB; normal respiratory effort Gastrointestinal: Bowel sounds are normal, liver is not enlarged, spleen is not enlarged Musculoskeletal: peripheral pulses normal, no pedal edema, no clubbing or cyanosis Skin: negative for - jaundice, spider hemangioma,  telangiectasia, palmar erythema, ecchymosis and atrophy; no porphyria cutanea tarda Lymphatic: no cervical lymphadenopathy   Laboratory Genotype:  Lab Results  Component Value Date   HCVGENOTYPE 1a 11/09/2015   HCV viral load:  Lab Results  Component Value Date   HCVQUANT [id number]* 11/09/2015   Lab Results  Component Value Date   WBC 7.3 11/09/2015   HGB 14.2 11/09/2015   HCT 42.6 11/09/2015   MCV 97.0 11/09/2015   PLT 156 11/09/2015    Lab Results  Component Value Date   CREATININE 1.31* 11/09/2015   BUN 11 11/09/2015   NA 135 11/09/2015   K 4.1 11/09/2015   CL 100 11/09/2015   CO2 23 11/09/2015    Lab Results  Component Value Date   ALT 128* 11/09/2015   AST 148* 11/09/2015   ALKPHOS 113 11/09/2015     Labs and history reviewed and show CHILD-PUGH A  5-6 points: Child class A 7-9 points: Child class B 10-15 points: Child class C  Lab Results  Component Value Date   INR 1.09 11/09/2015   BILITOT 0.8 11/09/2015   ALBUMIN 3.6 11/09/2015     Assessment: New Patient with Chronic Hepatitis C genotype 1a, untreated.  I discussed with the patient the lab findings that confirm chronic hepatitis C as well as the natural history and progression of disease including about 30% of people who develop cirrhosis of the liver if left untreated and once cirrhosis is established there is a 2-7% risk per year of liver cancer and liver failure.  I discussed the importance of treatment and benefits in reducing the risk, even if significant liver fibrosis exists.   Plan: 1) Patient counseled extensively on limiting acetaminophen to no more than 2 grams daily, avoidance of alcohol. 2) Transmission discussed with patient including sexual transmission, sharing razors and toothbrush.   3) Will need referral to gastroenterology if concern for cirrhosis 4) Will need referral for substance abuse counseling: No.; Further work up to include urine drug screen  No. 5) Will prescribe  Harvoni for 12 weeks with ribavirin since he is treatment experienced.  If no cirrhosis, it is possible to go without ribavirin, but I feel his best chance is with the addition of riba, at least initially.   6) Hepatitis A vaccine No. 7) Hepatitis B vaccine No. 8) Pneumovax vaccine if concern for cirrhosis 9) Further work up to include liver staging with elastography 10) will follow up after starting medication. 11) he knows to take omeprazole at same time at [facility], when needed. He does not take daily.  12) will need Hgb check within the first 2 weeks then at 4 weeks, along with viral load, CMP at 4 weeks.

## 2016-01-17 ENCOUNTER — Ambulatory Visit (HOSPITAL_COMMUNITY)
Admission: RE | Admit: 2016-01-17 | Discharge: 2016-01-17 | Disposition: A | Discharge status: Home / Self Care | Payer: Medicare Other | Source: Ambulatory Visit | Attending: Internal Medicine | Admitting: Internal Medicine

## 2016-01-17 DIAGNOSIS — B182 Chronic viral hepatitis C: Secondary | ICD-10-CM | POA: Diagnosis present

## 2016-01-18 ENCOUNTER — Other Ambulatory Visit: Payer: Self-pay | Admitting: Pharmacist Clinician (PhC)/ Clinical Pharmacy Specialist

## 2016-01-18 MED ORDER — SOFOSBUVIR-VELPATASVIR 400-100 MG PO TABS: 1.0000 | ORAL_TABLET | Freq: Every day | ORAL | Status: DC

## 2016-01-31 MED FILL — *EPCLUSA 400 MG-100 MG TAB: 400-100 | 28 days supply | Qty: 28 | Fill #0

## 2016-02-22 ENCOUNTER — Ambulatory Visit: Payer: Medicare Other

## 2016-02-27 MED FILL — *EPCLUSA 400 MG-100 MG TAB: 400-100 | 28 days supply | Qty: 28 | Fill #1

## 2016-03-26 ENCOUNTER — Other Ambulatory Visit: Payer: Self-pay | Admitting: Pharmacist Clinician (PhC)/ Clinical Pharmacy Specialist

## 2016-03-26 MED ORDER — SOFOSBUVIR-VELPATASVIR 400-100 MG PO TABS: 1.0000 | ORAL_TABLET | Freq: Every day | ORAL | Status: DC

## 2016-03-26 MED FILL — *EPCLUSA 400 MG-100 MG TAB: 400-100 | 28 days supply | Qty: 28 | Fill #2

## 2017-02-19 ENCOUNTER — Ambulatory Visit (HOSPITAL_COMMUNITY)
Admission: RE | Admit: 2017-02-19 | Discharge: 2017-02-19 | Disposition: A | Discharge status: Home / Self Care | Payer: Medicare Other | Source: Ambulatory Visit | Attending: Internal Medicine | Admitting: Internal Medicine

## 2017-02-19 ENCOUNTER — Other Ambulatory Visit (HOSPITAL_COMMUNITY): Payer: Self-pay | Admitting: Internal Medicine

## 2017-02-19 DIAGNOSIS — R05 Cough: Secondary | ICD-10-CM | POA: Diagnosis not present

## 2017-02-19 DIAGNOSIS — R059 Cough, unspecified: Secondary | ICD-10-CM

## 2017-03-08 ENCOUNTER — Other Ambulatory Visit (HOSPITAL_COMMUNITY): Payer: Self-pay | Admitting: Internal Medicine

## 2017-03-08 DIAGNOSIS — Z720 Tobacco use: Secondary | ICD-10-CM

## 2018-07-30 ENCOUNTER — Encounter: Payer: Self-pay | Admitting: Hematology & Oncology

## 2018-08-06 ENCOUNTER — Inpatient Hospital Stay: Payer: Medicare Other | Attending: Hematology & Oncology | Admitting: Hematology & Oncology

## 2018-08-06 ENCOUNTER — Other Ambulatory Visit: Payer: Self-pay

## 2018-08-06 ENCOUNTER — Emergency Department (HOSPITAL_BASED_OUTPATIENT_CLINIC_OR_DEPARTMENT_OTHER): Payer: Medicare Other

## 2018-08-06 ENCOUNTER — Encounter: Payer: Self-pay | Admitting: Hematology & Oncology

## 2018-08-06 ENCOUNTER — Inpatient Hospital Stay: Payer: Medicare Other

## 2018-08-06 ENCOUNTER — Inpatient Hospital Stay (HOSPITAL_BASED_OUTPATIENT_CLINIC_OR_DEPARTMENT_OTHER)
Admission: EM | Admit: 2018-08-06 | Discharge: 2018-08-14 | DRG: 981 | Disposition: A | Discharge status: Home / Self Care | Payer: Medicare Other | Attending: Internal Medicine | Admitting: Internal Medicine

## 2018-08-06 VITALS — BP 105/64 | HR 116 | Temp 97.8°F | Resp 19 | Wt 132.1 lb

## 2018-08-06 DIAGNOSIS — R509 Fever, unspecified: Secondary | ICD-10-CM

## 2018-08-06 DIAGNOSIS — J189 Pneumonia, unspecified organism: Secondary | ICD-10-CM

## 2018-08-06 DIAGNOSIS — C771 Secondary and unspecified malignant neoplasm of intrathoracic lymph nodes: Secondary | ICD-10-CM | POA: Diagnosis present

## 2018-08-06 DIAGNOSIS — R222 Localized swelling, mass and lump, trunk: Secondary | ICD-10-CM | POA: Diagnosis not present

## 2018-08-06 DIAGNOSIS — K219 Gastro-esophageal reflux disease without esophagitis: Secondary | ICD-10-CM

## 2018-08-06 DIAGNOSIS — A419 Sepsis, unspecified organism: Secondary | ICD-10-CM | POA: Diagnosis not present

## 2018-08-06 DIAGNOSIS — M129 Arthropathy, unspecified: Secondary | ICD-10-CM

## 2018-08-06 DIAGNOSIS — R079 Chest pain, unspecified: Secondary | ICD-10-CM

## 2018-08-06 DIAGNOSIS — H538 Other visual disturbances: Secondary | ICD-10-CM | POA: Diagnosis present

## 2018-08-06 DIAGNOSIS — I10 Essential (primary) hypertension: Secondary | ICD-10-CM

## 2018-08-06 DIAGNOSIS — F17210 Nicotine dependence, cigarettes, uncomplicated: Secondary | ICD-10-CM | POA: Diagnosis present

## 2018-08-06 DIAGNOSIS — Z79899 Other long term (current) drug therapy: Secondary | ICD-10-CM

## 2018-08-06 DIAGNOSIS — E871 Hypo-osmolality and hyponatremia: Secondary | ICD-10-CM | POA: Diagnosis not present

## 2018-08-06 DIAGNOSIS — J439 Emphysema, unspecified: Secondary | ICD-10-CM | POA: Diagnosis present

## 2018-08-06 DIAGNOSIS — R918 Other nonspecific abnormal finding of lung field: Secondary | ICD-10-CM | POA: Diagnosis present

## 2018-08-06 DIAGNOSIS — C3490 Malignant neoplasm of unspecified part of unspecified bronchus or lung: Secondary | ICD-10-CM | POA: Diagnosis not present

## 2018-08-06 DIAGNOSIS — Z720 Tobacco use: Secondary | ICD-10-CM | POA: Diagnosis not present

## 2018-08-06 DIAGNOSIS — R0902 Hypoxemia: Secondary | ICD-10-CM | POA: Diagnosis not present

## 2018-08-06 DIAGNOSIS — B182 Chronic viral hepatitis C: Secondary | ICD-10-CM

## 2018-08-06 DIAGNOSIS — R599 Enlarged lymph nodes, unspecified: Secondary | ICD-10-CM

## 2018-08-06 DIAGNOSIS — J9859 Other diseases of mediastinum, not elsewhere classified: Secondary | ICD-10-CM

## 2018-08-06 DIAGNOSIS — C3491 Malignant neoplasm of unspecified part of right bronchus or lung: Secondary | ICD-10-CM | POA: Diagnosis not present

## 2018-08-06 DIAGNOSIS — E43 Unspecified severe protein-calorie malnutrition: Secondary | ICD-10-CM | POA: Diagnosis present

## 2018-08-06 DIAGNOSIS — R042 Hemoptysis: Secondary | ICD-10-CM

## 2018-08-06 DIAGNOSIS — J479 Bronchiectasis, uncomplicated: Secondary | ICD-10-CM | POA: Diagnosis present

## 2018-08-06 DIAGNOSIS — Z681 Body mass index (BMI) 19 or less, adult: Secondary | ICD-10-CM

## 2018-08-06 DIAGNOSIS — C3431 Malignant neoplasm of lower lobe, right bronchus or lung: Secondary | ICD-10-CM | POA: Diagnosis present

## 2018-08-06 DIAGNOSIS — E440 Moderate protein-calorie malnutrition: Secondary | ICD-10-CM

## 2018-08-06 DIAGNOSIS — R590 Localized enlarged lymph nodes: Secondary | ICD-10-CM

## 2018-08-06 DIAGNOSIS — R634 Abnormal weight loss: Secondary | ICD-10-CM | POA: Diagnosis not present

## 2018-08-06 DIAGNOSIS — F1920 Other psychoactive substance dependence, uncomplicated: Secondary | ICD-10-CM

## 2018-08-06 DIAGNOSIS — R911 Solitary pulmonary nodule: Secondary | ICD-10-CM | POA: Diagnosis not present

## 2018-08-06 DIAGNOSIS — R0602 Shortness of breath: Secondary | ICD-10-CM

## 2018-08-06 DIAGNOSIS — B1920 Unspecified viral hepatitis C without hepatic coma: Secondary | ICD-10-CM

## 2018-08-06 DIAGNOSIS — J929 Pleural plaque without asbestos: Secondary | ICD-10-CM | POA: Diagnosis not present

## 2018-08-06 DIAGNOSIS — R51 Headache: Secondary | ICD-10-CM

## 2018-08-06 HISTORY — DX: Hypercalcemia: E83.52

## 2018-08-06 HISTORY — DX: Other diseases of mediastinum, not elsewhere classified: J98.59

## 2018-08-06 HISTORY — DX: Unspecified viral hepatitis C without hepatic coma: B19.20

## 2018-08-06 HISTORY — DX: Hemoptysis: R04.2

## 2018-08-06 LAB — CBC WITH DIFFERENTIAL (CANCER CENTER ONLY)
BASOS PCT: 0 %
Basophils Absolute: 0 10*3/uL (ref 0.0–0.1)
EOS ABS: 0.1 10*3/uL (ref 0.0–0.5)
EOS PCT: 1 %
HCT: 44.3 % (ref 38.7–49.9)
Hemoglobin: 15.2 g/dL (ref 13.0–17.1)
Lymphocytes Relative: 21 %
Lymphs Abs: 2.2 10*3/uL (ref 0.9–3.3)
MCH: 31.8 pg (ref 28.0–33.4)
MCHC: 34.3 g/dL (ref 32.0–35.9)
MCV: 92.7 fL (ref 82.0–98.0)
MONO ABS: 1 10*3/uL — AB (ref 0.1–0.9)
MONOS PCT: 10 %
Neutro Abs: 6.9 10*3/uL — ABNORMAL HIGH (ref 1.5–6.5)
Neutrophils Relative %: 68 %
Platelet Count: 280 10*3/uL (ref 145–400)
RBC: 4.78 MIL/uL (ref 4.20–5.70)
RDW: 12.3 % (ref 11.1–15.7)
WBC Count: 10.2 10*3/uL — ABNORMAL HIGH (ref 4.0–10.0)

## 2018-08-06 LAB — CMP (CANCER CENTER ONLY)
ALBUMIN: 3.1 g/dL — AB (ref 3.5–5.0)
ALT: 122 U/L — ABNORMAL HIGH (ref 10–47)
AST: 165 U/L — ABNORMAL HIGH (ref 11–38)
Alkaline Phosphatase: 78 U/L (ref 26–84)
Anion gap: 4 — ABNORMAL LOW (ref 5–15)
BUN: 18 mg/dL (ref 7–22)
CHLORIDE: 100 mmol/L (ref 98–108)
CO2: 25 mmol/L (ref 18–33)
CREATININE: 1.4 mg/dL — AB (ref 0.60–1.20)
Calcium: 10.4 mg/dL — ABNORMAL HIGH (ref 8.0–10.3)
Glucose, Bld: 143 mg/dL — ABNORMAL HIGH (ref 73–118)
POTASSIUM: 4.2 mmol/L (ref 3.3–4.7)
SODIUM: 129 mmol/L (ref 128–145)
Total Bilirubin: 0.7 mg/dL (ref 0.2–1.6)
Total Protein: 10.8 g/dL — ABNORMAL HIGH (ref 6.4–8.1)

## 2018-08-06 MED ORDER — OXYCODONE-ACETAMINOPHEN 5-325 MG PO TABS
1.0000 | ORAL_TABLET | Freq: Once | ORAL | Status: AC
  Administered 2018-08-06: 1 via ORAL
  Filled 2018-08-06: qty 1

## 2018-08-06 MED ORDER — GABAPENTIN 600 MG PO TABS
300.0000 mg | ORAL_TABLET | Freq: Three times a day (TID) | ORAL | Status: DC
  Administered 2018-08-06: 300 mg via ORAL
  Filled 2018-08-06: qty 1
  Filled 2018-08-06: qty 0.5

## 2018-08-06 MED ORDER — IOPAMIDOL (ISOVUE-300) INJECTION 61%
100.0000 mL | Freq: Once | INTRAVENOUS | Status: AC | PRN
  Administered 2018-08-06: 100 mL via INTRAVENOUS

## 2018-08-06 MED ORDER — IPRATROPIUM-ALBUTEROL 0.5-2.5 (3) MG/3ML IN SOLN
3.0000 mL | Freq: Four times a day (QID) | RESPIRATORY_TRACT | Status: DC
  Administered 2018-08-06 – 2018-08-09 (×9): 3 mL via RESPIRATORY_TRACT
  Filled 2018-08-06 (×10): qty 3

## 2018-08-06 MED ORDER — SODIUM CHLORIDE 0.9 % IV BOLUS
1000.0000 mL | Freq: Once | INTRAVENOUS | Status: AC
  Administered 2018-08-06: 1000 mL via INTRAVENOUS

## 2018-08-06 NOTE — ED Triage Notes (Signed)
Pt sent from [place] in the building for hemoptysis-pt NAD-steady gait with own cane

## 2018-08-06 NOTE — ED Provider Notes (Signed)
[facility] EMERGENCY DEPARTMENT Provider Note   CSN: [id number] Arrival date & time: 08/06/18  1634     History   Chief Complaint Chief Complaint  Patient presents with  . Hemoptysis    HPI Ryan Houston is a 72 y.o. male presenting for evaluation of hemoptysis.  Patient states for the past 2 to 3 weeks, he has been having hemoptysis.  He states he is coughing up chunks.  He was evaluated for this 2 weeks ago, had a CT chest which showed a mediastinal mass.  He reports about a 30 pound weight loss unintentionally over the past 8 months.  He reports pain from his head to the right side of his chest.  This is constant has been going on for "a while."  He denies fevers, chills, increased shortness of breath, nausea, vomiting, abdominal pain.  He denies recent change in his medications.  He was evaluated by his outpatient oncologist, who recommended he comes to the emergency room for admission to the hospital for further work-up of likely lung cancer.  Patient states over the past week, he has been having increasing tiredness and weakness.  He is not on blood thinners.  HPI  Past Medical History:  Diagnosis Date  . Arthritis   . Cough with hemoptysis 08/06/2018  . GERD (gastroesophageal reflux disease)   . Hypercalcemia of malignancy 08/06/2018  . Hypertension   . Mediastinal mass 08/06/2018    Patient Active Problem List   Diagnosis Date Noted  . Mediastinal mass 08/06/2018  . Cough with hemoptysis 08/06/2018  . Hypercalcemia of malignancy 08/06/2018  . Lung mass 08/06/2018  . Chronic hepatitis C without hepatic coma ([place]) 06/13/2007  . ANXIETY 06/13/2007  . HEMORRHOIDS 06/13/2007  . GERD 06/13/2007  . DERMATITIS, CONTACT, NOS 06/13/2007  . SPONDYLOSIS, LUMBOSACRAL 06/13/2007  . HERNIATED DISC 06/13/2007    Past Surgical History:  Procedure Laterality Date  . BACK SURGERY          Home Medications    Prior to Admission medications   Medication Sig Start  Date End Date Taking? Authorizing Provider  albuterol (PROVENTIL HFA;VENTOLIN HFA) 108 (90 BASE) MCG/ACT inhaler Inhale 1-2 puffs into the lungs every 6 (six) hours as needed for wheezing. 07/05/12 07/05/13  Lacretia Leigh, MD  azelastine (ASTELIN) 0.1 % nasal spray Place into both nostrils 2 (two) times daily. Use in each nostril as directed    [provider]  Cholecalciferol (VITAMIN D-3) 1000 UNITS CAPS Take by mouth.    [provider]  fluticasone (FLONASE) 50 MCG/ACT nasal spray Place 1 spray into the nose daily.    [provider]  gabapentin (NEURONTIN) 300 MG capsule Take 300 mg by mouth 3 (three) times daily.    [provider]  halobetasol (ULTRAVATE) 0.05 % cream Apply 1 application topically 2 (two) times daily.    [provider]  hydrOXYzine (ATARAX/VISTARIL) 25 MG tablet Take 1 tablet (25 mg total) by mouth every 6 (six) hours. 12/06/12   Montine Circle, PA-C  ibuprofen (ADVIL,MOTRIN) 800 MG tablet Take 800 mg by mouth 3 (three) times daily with meals. Pain    [provider]  Ipratropium-Albuterol (COMBIVENT RESPIMAT) 20-100 MCG/ACT AERS respimat Inhale 1 puff into the lungs every 6 (six) hours.    [provider]  mirabegron ER (MYRBETRIQ) 25 MG TB24 tablet Take 25 mg by mouth daily.    [provider]  montelukast (SINGULAIR) 10 MG tablet Take 10 mg by mouth at  bedtime.    [provider]  omeprazole (PRILOSEC) 20 MG capsule Take 1 capsule (20 mg total) by mouth daily. 12/06/12   Montine Circle, PA-C  oxyCODONE-acetaminophen (PERCOCET) 5-325 MG per tablet Take 1 tablet by mouth 2 (two) times daily. Pain    [provider]  Sofosbuvir-Velpatasvir (EPCLUSA) 400-100 MG TABS Take 1 tablet by mouth daily. 03/26/16   Comer, Okey Regal, MD  traMADol (ULTRAM) 50 MG tablet Take 50 mg by mouth every 6 (six) hours as needed.    [provider]    Family History No family history on  file.  Social History Social History   Tobacco Use  . Smoking status: Current Every Day Smoker    Packs/day: 1.00    Types: Cigarettes  . Smokeless tobacco: Never Used  Substance Use Topics  . Alcohol use: Yes    Alcohol/week: 0.0 oz    Comment: beer a day  . Drug use: Yes    Types: Marijuana    Comment: 1-2x wk     Allergies   Patient has no known allergies.   Review of Systems Review of Systems  Constitutional: Positive for unexpected weight change.  Respiratory: Positive for cough (hemoptysis ).   Neurological: Positive for weakness.  All other systems reviewed and are negative.    Physical Exam Updated Vital Signs BP 103/68 (BP Location: Right Arm)   Pulse 85   Temp 98.3 F (36.8 C) (Oral)   Resp 18   Ht 5\' 9"  (1.753 m)   Wt 59.9 kg (132 lb)   SpO2 95%   BMI 19.49 kg/m   Physical Exam  Constitutional: He is oriented to person, place, and time. No distress.  Elderly, chronically ill-appearing male in no acute distress  HENT:  Head: Normocephalic and atraumatic.  MM dry  Eyes: Pupils are equal, round, and reactive to light. Conjunctivae and EOM are normal.  Neck: Normal range of motion. Neck supple.  Cardiovascular: Regular rhythm and intact distal pulses.  Mildly tachycardic around 105  Pulmonary/Chest: Effort normal. No accessory muscle usage. No respiratory distress. He has no decreased breath sounds. He has wheezes. He has no rhonchi. He has no rales.  Expiratory wheezes in all fields.  Speaking in full sentences.  No respiratory distress.  Abdominal: Soft. He exhibits no distension and no mass. There is no tenderness. There is no guarding.  Musculoskeletal: Normal range of motion.  Neurological: He is alert and oriented to person, place, and time.  Skin: Skin is warm and dry. Capillary refill takes less than 2 seconds.  Psychiatric: He has a normal mood and affect.  Nursing note and vitals reviewed.    ED Treatments / Results  Labs (all labs  ordered are listed, but only abnormal results are displayed) Labs Reviewed  PTH-RELATED PEPTIDE    EKG None  Radiology Ct Chest W Contrast  Result Date: 08/06/2018 CLINICAL DATA:  Hemoptysis for the past 2 weeks. Shortness of breath on exertion. Chest pain. Low back pain. Smoker. Large subcarinal mass, right hilar adenopathy, bilateral pleural plaques and possible left renal mass on a recent chest CT at [facility], dated 07/23/2018. EXAM: CT CHEST, ABDOMEN, AND PELVIS WITH CONTRAST TECHNIQUE: Multidetector CT imaging of the chest, abdomen and pelvis was performed following the standard protocol during bolus administration of intravenous contrast. CONTRAST:  161mL ISOVUE-300 IOPAMIDOL (ISOVUE-300) INJECTION 61% COMPARISON:  Chest radiographs obtained earlier today. Chest CT report dated 07/23/2018 from [facility]. FINDINGS: CT CHEST FINDINGS Cardiovascular: Atheromatous calcifications,  including the coronary arteries and aorta. Mediastinum/Nodes: Heterogeneous subcarinal mass invading and occluding the right lower lobe bronchus and bronchus intermedius. This mass measures 5.8 x 3.2 cm on image number 33 series 2. There is irregular soft tissue density extending in the peribronchial regions into the more peripheral aspects of the right lower lobe. Similar-appearing enlarged right hilar lymph nodes. The largest has a short axis diameter of 16 mm on image number 38 of series 2, containing central low density. Mildly enlarged precarinal node with a short axis diameter of 11 mm on image number 23 series 2. Unremarkable included thyroid gland. Lungs/Pleura: Bilateral calcified pleural plaques. Extensive bilateral bullous changes. Mild right lower lobe atelectasis. Musculoskeletal: Mild thoracic spine degenerative changes. No evidence of bony metastatic disease. CT ABDOMEN PELVIS FINDINGS Hepatobiliary: Mild diffuse low density of the liver relative to the spleen. Normal appearing gallbladder. Pancreas:  Unremarkable. No pancreatic ductal dilatation or surrounding inflammatory changes. Spleen: Normal in size without focal abnormality. Adrenals/Urinary Tract: Small upper pole left renal cyst. Normal appearing adrenal glands, right kidney, ureters and urinary bladder. Stomach/Bowel: Stomach is within normal limits. Appendix appears normal. No evidence of bowel wall thickening, distention, or inflammatory changes. Vascular/Lymphatic: Atheromatous arterial calcifications without aneurysm. No enlarged lymph nodes. Reproductive: Moderately enlarged prostate gland. Other: Small right inguinal hernia containing fat and portion of a loop of small bowel without obstruction. Small left inguinal hernia containing fat. Musculoskeletal: Mild left and minimal right hip degenerative changes. Mild levoconvex lumbar scoliosis. Lumbar spine degenerative changes. No evidence of bony metastatic disease. IMPRESSION: 1. 5.8 x 3.2 cm subcarinal and medial right lower lobe lung mass invading and occluding the right lower lobe bronchus and bronchus intermedius. This is most compatible with a primary lung neoplasm. 2. Metastatic right hilar and precarinal adenopathy. 3. Probable tumor extending along the bronchi into the more peripheral portions of the right lower lobe. 4. Mild right lower lobe atelectasis. 5. Centrilobular and paraseptal emphysema. 6. Bilateral calcified pleural plaques, compatible with previous asbestos exposure. 7.  Calcific coronary artery and aortic atherosclerosis. 8. Mild diffuse hepatic steatosis. 9. Moderate prostatic hypertrophy. 10. Small right inguinal hernia containing a portion of a small bowel loop without obstruction Electronically Signed   By: Claudie Revering M.D.   On: 08/06/2018 19:11   Ct Abdomen Pelvis W Contrast  Result Date: 08/06/2018 CLINICAL DATA:  Hemoptysis for the past 2 weeks. Shortness of breath on exertion. Chest pain. Low back pain. Smoker. Large subcarinal mass, right hilar adenopathy,  bilateral pleural plaques and possible left renal mass on a recent chest CT at [facility], dated 07/23/2018. EXAM: CT CHEST, ABDOMEN, AND PELVIS WITH CONTRAST TECHNIQUE: Multidetector CT imaging of the chest, abdomen and pelvis was performed following the standard protocol during bolus administration of intravenous contrast. CONTRAST:  131mL ISOVUE-300 IOPAMIDOL (ISOVUE-300) INJECTION 61% COMPARISON:  Chest radiographs obtained earlier today. Chest CT report dated 07/23/2018 from [facility]. FINDINGS: CT CHEST FINDINGS Cardiovascular: Atheromatous calcifications, including the coronary arteries and aorta. Mediastinum/Nodes: Heterogeneous subcarinal mass invading and occluding the right lower lobe bronchus and bronchus intermedius. This mass measures 5.8 x 3.2 cm on image number 33 series 2. There is irregular soft tissue density extending in the peribronchial regions into the more peripheral aspects of the right lower lobe. Similar-appearing enlarged right hilar lymph nodes. The largest has a short axis diameter of 16 mm on image number 38 of series 2, containing central low density. Mildly enlarged precarinal node with a short axis diameter of 11 mm  on image number 23 series 2. Unremarkable included thyroid gland. Lungs/Pleura: Bilateral calcified pleural plaques. Extensive bilateral bullous changes. Mild right lower lobe atelectasis. Musculoskeletal: Mild thoracic spine degenerative changes. No evidence of bony metastatic disease. CT ABDOMEN PELVIS FINDINGS Hepatobiliary: Mild diffuse low density of the liver relative to the spleen. Normal appearing gallbladder. Pancreas: Unremarkable. No pancreatic ductal dilatation or surrounding inflammatory changes. Spleen: Normal in size without focal abnormality. Adrenals/Urinary Tract: Small upper pole left renal cyst. Normal appearing adrenal glands, right kidney, ureters and urinary bladder. Stomach/Bowel: Stomach is within normal limits. Appendix appears normal. No  evidence of bowel wall thickening, distention, or inflammatory changes. Vascular/Lymphatic: Atheromatous arterial calcifications without aneurysm. No enlarged lymph nodes. Reproductive: Moderately enlarged prostate gland. Other: Small right inguinal hernia containing fat and portion of a loop of small bowel without obstruction. Small left inguinal hernia containing fat. Musculoskeletal: Mild left and minimal right hip degenerative changes. Mild levoconvex lumbar scoliosis. Lumbar spine degenerative changes. No evidence of bony metastatic disease. IMPRESSION: 1. 5.8 x 3.2 cm subcarinal and medial right lower lobe lung mass invading and occluding the right lower lobe bronchus and bronchus intermedius. This is most compatible with a primary lung neoplasm. 2. Metastatic right hilar and precarinal adenopathy. 3. Probable tumor extending along the bronchi into the more peripheral portions of the right lower lobe. 4. Mild right lower lobe atelectasis. 5. Centrilobular and paraseptal emphysema. 6. Bilateral calcified pleural plaques, compatible with previous asbestos exposure. 7.  Calcific coronary artery and aortic atherosclerosis. 8. Mild diffuse hepatic steatosis. 9. Moderate prostatic hypertrophy. 10. Small right inguinal hernia containing a portion of a small bowel loop without obstruction Electronically Signed   By: Claudie Revering M.D.   On: 08/06/2018 19:11    Procedures Procedures (including critical care time)  Medications Ordered in ED Medications  ipratropium-albuterol (DUONEB) 0.5-2.5 (3) MG/3ML nebulizer solution 3 mL (3 mLs Nebulization Given 08/06/18 1958)  gabapentin (NEURONTIN) tablet 300 mg (300 mg Oral Given 08/06/18 2154)  sodium chloride 0.9 % bolus 1,000 mL (0 mLs Intravenous Stopped 08/06/18 1910)  iopamidol (ISOVUE-300) 61 % injection 100 mL (100 mLs Intravenous Contrast Given 08/06/18 1819)  oxyCODONE-acetaminophen (PERCOCET/ROXICET) 5-325 MG per tablet 1 tablet (1 tablet Oral Given 08/06/18 2154)       Initial Impression / Assessment and Plan / ED Course  I have reviewed the triage vital signs and the nursing notes.  Pertinent labs & imaging results that were available during my care of the patient were reviewed by me and considered in my medical decision making (see chart for details).     Pt presenting for evaluation of hemoptysis and lung mass.  Sent by his oncologist for further evaluation.  Physical exam shows patient who appears in no distress.  No significant worsening of focal symptoms recently, however reports increasing tiredness and weakness.  Labs performed at the clinic just prior to arrival shows stable hemoglobin.  Mild leukocytosis to 10.7.  Creatinine mildly elevated at 1.4, baseline 1.2.  Otherwise labs reassuring.  Will obtain repeat CT chest and abdomen for further evaluation of the disease process.  CT chest shows right lower lobe mass invading into the right bronchus.  As patient was sent by his oncologist for admission for further work-up, will call for admission to the hospitalist service.  Additionally, will consult with pulmonology for likely need for bronchoscopy.   Discussed with Dr. Lucile Shutters from pulmonology and critical care, who states the patient will be added to the list and they will consult  once patient gets admitted to [facility].  Discussed with Dr. Alcario Drought from [facility] hospitalist service, who will admit the patient.   Final Clinical Impressions(s) / ED Diagnoses   Final diagnoses:  Right lower lobe lung mass  Hemoptysis    ED Discharge Orders    None       Franchot Heidelberg, PA-C 08/06/18 2346    Margette Fast, MD 08/07/18 [phone]

## 2018-08-06 NOTE — Progress Notes (Addendum)
Referral MD  Reason for Referral: Hemoptysis; subcarinal mass  Chief Complaint  Patient presents with  . New Patient (Initial Visit)  : I have been coughing up blood.  I have been losing weight.  HPI: [name] is a real nice 72 year old African-American male.  He has a history of hepatitis C.  He has had this for many years.  He was a former drug addict.  He says that he had been given treatment for this ([facility])but has never taken the pills.    A year ago, he weighed over 160 pounds.  He now weighs 132 pounds.  He began to have some hemoptysis about a month ago.  He is coughing up quite a bit of blood.  He does have some substernal chest pain.  He was having some shortness of breath with exertion.  He went to the emergency room at a local hospital.  He had a CT scan done.  This showed a subcarinal mass measuring 5.3 x 3.2 cm.  There was mass-effect on the right mainstem bronchus.  He had a right hilar lymph node measuring 3.4 x 1.8 cm.  A right paratracheal lymph node measured 1.1 x 1.6 m.  There is a right lower lobe area that measures 3.8 x 1.2 cm.  There is a possible left renal mass.  He was then referred to the [place] center.  He still is having some hemoptysis.  It about a teaspoonful at a time.  He is still smoking.  He probably has about a 80-pack-year history of tobacco use.  He says he probably smokes about half pack per day.  He has had a decent appetite.  He has had no nausea or vomiting.  He has had no obvious change in bowel or bladder habits.  He has had no rashes.  He does have some headaches.  He says the headache is behind the right eye.  There is no double vision or blurred vision.  Overall, I said his performance status is ECOG 2.   Past Medical History:  Diagnosis Date  . Arthritis   . GERD (gastroesophageal reflux disease)   . Hypertension   :  Past Surgical History:  Procedure Laterality Date  . BACK SURGERY    :   Current  Outpatient Medications:  .  azelastine (ASTELIN) 0.1 % nasal spray, Place into both nostrils 2 (two) times daily. Use in each nostril as directed, Disp: , Rfl:  .  gabapentin (NEURONTIN) 300 MG capsule, Take 300 mg by mouth 3 (three) times daily., Disp: , Rfl:  .  Ipratropium-Albuterol (COMBIVENT RESPIMAT) 20-100 MCG/ACT AERS respimat, Inhale 1 puff into the lungs every 6 (six) hours., Disp: , Rfl:  .  traMADol (ULTRAM) 50 MG tablet, Take 50 mg by mouth every 6 (six) hours as needed., Disp: , Rfl:  .  albuterol (PROVENTIL HFA;VENTOLIN HFA) 108 (90 BASE) MCG/ACT inhaler, Inhale 1-2 puffs into the lungs every 6 (six) hours as needed for wheezing., Disp: 1 Inhaler, Rfl: 0 .  Cholecalciferol (VITAMIN D-3) 1000 UNITS CAPS, Take by mouth., Disp: , Rfl:  .  fluticasone (FLONASE) 50 MCG/ACT nasal spray, Place 1 spray into the nose daily., Disp: , Rfl:  .  halobetasol (ULTRAVATE) 0.05 % cream, Apply 1 application topically 2 (two) times daily., Disp: , Rfl:  .  hydrOXYzine (ATARAX/VISTARIL) 25 MG tablet, Take 1 tablet (25 mg total) by mouth every 6 (six) hours., Disp: 30 tablet, Rfl: 0 .  ibuprofen (ADVIL,MOTRIN) 800 MG  tablet, Take 800 mg by mouth 3 (three) times daily with meals. Pain, Disp: , Rfl:  .  mirabegron ER (MYRBETRIQ) 25 MG TB24 tablet, Take 25 mg by mouth daily., Disp: , Rfl:  .  montelukast (SINGULAIR) 10 MG tablet, Take 10 mg by mouth at bedtime., Disp: , Rfl:  .  omeprazole (PRILOSEC) 20 MG capsule, Take 1 capsule (20 mg total) by mouth daily., Disp: 30 capsule, Rfl: 0 .  oxyCODONE-acetaminophen (PERCOCET) 5-325 MG per tablet, Take 1 tablet by mouth 2 (two) times daily. Pain, Disp: , Rfl:  .  Sofosbuvir-Velpatasvir (EPCLUSA) 400-100 MG TABS, Take 1 tablet by mouth daily., Disp: 28 tablet, Rfl: 2:  :  No Known Allergies:  History reviewed. No pertinent family history.:  Social History   Socioeconomic History  . Marital status: Single    Spouse name: Not on file  . Number of  children: Not on file  . Years of education: Not on file  . Highest education level: Not on file  Occupational History  . Not on file  Social Needs  . Financial resource strain: Not on file  . Food insecurity:    Worry: Not on file    Inability: Not on file  . Transportation needs:    Medical: Not on file    Non-medical: Not on file  Tobacco Use  . Smoking status: Current Every Day Smoker    Packs/day: 1.00    Types: Cigarettes  . Smokeless tobacco: Never Used  Substance and Sexual Activity  . Alcohol use: Yes    Alcohol/week: 0.0 oz    Comment: beer a day  . Drug use: Yes    Types: Marijuana    Comment: 1-2x wk  . Sexual activity: Not on file  Lifestyle  . Physical activity:    Days per week: Not on file    Minutes per session: Not on file  . Stress: Not on file  Relationships  . Social connections:    Talks on phone: Not on file    Gets together: Not on file    Attends religious service: Not on file    Active member of club or organization: Not on file    Attends meetings of clubs or organizations: Not on file    Relationship status: Not on file  . Intimate partner violence:    Fear of current or ex partner: Not on file    Emotionally abused: Not on file    Physically abused: Not on file    Forced sexual activity: Not on file  Other Topics Concern  . Not on file  Social History Narrative  . Not on file  :  Review of Systems  Constitutional: Negative.   HENT: Negative.   Eyes: Negative.   Respiratory: Positive for hemoptysis and shortness of breath.   Cardiovascular: Positive for chest pain.  Gastrointestinal: Negative.   Genitourinary: Negative.   Musculoskeletal: Positive for back pain.  Skin: Negative.   Neurological: Positive for headaches.  Endo/Heme/Allergies: Negative.   Psychiatric/Behavioral: Negative.      Exam: Thin African-American male in no obvious distress.  Vital signs are temperature of 97.8.  Pulse 116.  Blood pressure 105/64.   Weight is 132 pounds.  Head neck exam shows no ocular or oral lesions.  There are no palpable cervical or supraclavicular lymph nodes.  Lungs are with slight decreased breath sounds about throughout both lung fields.  There is occasional expiratory wheezes.  Cardiac exam tachycardic but regular.  He has  no murmurs, rubs or bruits.  Abdomen is soft.  He has good bowel sounds.  There is no fluid wave.  There is no palpable hepatosplenomegaly.  Back exam shows no tenderness over the spine, ribs or hips.  Extremities shows muscle atrophy in upper and lower extremities.  Skin exam shows very dry skin.  He has some tenting of the skin.  Neurological exam shows no obvious neurological deficits. @IPVITALS @   Recent Labs    08/06/18 1509  WBC 10.2*  HGB 15.2  HCT 44.3  PLT 280   Recent Labs    08/06/18 1509  NA 129  K 4.2  CL 100  CO2 25  GLUCOSE 143*  BUN 18  CREATININE 1.40*  CALCIUM 10.4*    Blood smear review: None  Pathology: None    Assessment and Plan: Mr. Sunde is a really nice 72 year old African-American male.  He has hemoptysis.  He has weight loss.  He appears to be dehydrated.  I truly believe that he has bronchogenic carcinoma.  I would have to think that this is going to end up being squamous cell cancer by the appearance on the CT report.  I suppose that small cell lung cancer would also be a possibility.  He definitely needs to be admitted.  I think his performance status and the hemoptysis and the weight loss is all quite troublesome and I think would be very difficult for him to go to different doctors for evaluation.  Again, I worry about the hemoptysis.  I worried that he will have a massive episode of hemoptysis that could compromise his life.  He needs to be scoped with a bronchoscopy.  This is the only way I think that we could make a diagnosis on this.  Once we make a diagnosis, the treatment options will be dependent upon the extent of disease.  Hopefully,  he does has locally advanced disease.  I would say by the CT scan report that he at least has stage IIIa disease.  I am not sure what to make of the potential left kidney lesion.  He has some element of hypercalcemia.  This might be from dehydration.  It also could be from the malignancy in his chest.  Once he is hospitalized, we get all the necessary scans.  We can do an MRI of the brain make sure he has no brain mets.  We do a bone scan to see if there is any bone metastasis.  One"monkey wrench" with Mr. Kiester is the hepatitis C.  He has the [place].  We have to get him on this so that the hepatitis C can be treated.  I spent about an hour with he and his daughter.  I spent almost all the time face-to-face with them counseling them and going over the reports that I had.  I will help coordinate getting them into the hospital.  I think this is essential for an expeditious work-up and treatment.  I answered all their questions.  I plan to see him once he is in the hospital.

## 2018-08-07 ENCOUNTER — Inpatient Hospital Stay (HOSPITAL_COMMUNITY): Payer: Medicare Other

## 2018-08-07 ENCOUNTER — Encounter (HOSPITAL_COMMUNITY): Payer: Self-pay

## 2018-08-07 ENCOUNTER — Encounter (HOSPITAL_COMMUNITY)
Admission: EM | Disposition: A | Discharge status: Home / Self Care | Payer: Self-pay | Source: Home / Self Care | Attending: Internal Medicine

## 2018-08-07 DIAGNOSIS — Z720 Tobacco use: Secondary | ICD-10-CM

## 2018-08-07 DIAGNOSIS — B182 Chronic viral hepatitis C: Secondary | ICD-10-CM

## 2018-08-07 DIAGNOSIS — R042 Hemoptysis: Secondary | ICD-10-CM

## 2018-08-07 DIAGNOSIS — R911 Solitary pulmonary nodule: Secondary | ICD-10-CM

## 2018-08-07 DIAGNOSIS — J929 Pleural plaque without asbestos: Secondary | ICD-10-CM

## 2018-08-07 DIAGNOSIS — R918 Other nonspecific abnormal finding of lung field: Secondary | ICD-10-CM

## 2018-08-07 HISTORY — PX: VIDEO BRONCHOSCOPY: SHX5072

## 2018-08-07 LAB — BASIC METABOLIC PANEL
ANION GAP: 6 (ref 5–15)
BUN: 21 mg/dL (ref 8–23)
CALCIUM: 9.3 mg/dL (ref 8.9–10.3)
CO2: 27 mmol/L (ref 22–32)
CREATININE: 1.29 mg/dL — AB (ref 0.61–1.24)
Chloride: 102 mmol/L (ref 98–111)
GFR calc Af Amer: >60 mL/min (ref >60)
GFR, EST NON AFRICAN AMERICAN: 54 mL/min — AB (ref >60)
GLUCOSE: 128 mg/dL — AB (ref 70–99)
Potassium: 4.7 mmol/L (ref 3.5–5.1)
Sodium: 135 mmol/L (ref 135–145)

## 2018-08-07 LAB — CBC
HCT: 41.2 % (ref 39.0–52.0)
Hemoglobin: 14.1 g/dL (ref 13.0–17.0)
MCH: 32.3 pg (ref 26.0–34.0)
MCHC: 34.2 g/dL (ref 30.0–36.0)
MCV: 94.3 fL (ref 78.0–100.0)
PLATELETS: 259 10*3/uL (ref 150–400)
RBC: 4.37 MIL/uL (ref 4.22–5.81)
RDW: 12.8 % (ref 11.5–15.5)
WBC: 9.7 10*3/uL (ref 4.0–10.5)

## 2018-08-07 LAB — CEA (IN HOUSE-CHCC): CEA (CHCC-In House): 14.07 ng/mL — ABNORMAL HIGH (ref 0.00–5.00)

## 2018-08-07 LAB — LACTATE DEHYDROGENASE: LDH: 209 U/L — ABNORMAL HIGH (ref 98–192)

## 2018-08-07 SURGERY — BRONCHOSCOPY, WITH FLUOROSCOPY
Anesthesia: Moderate Sedation | Laterality: Bilateral

## 2018-08-07 MED ORDER — IBUPROFEN 200 MG PO TABS: 600.0000 mg | ORAL_TABLET | Freq: Four times a day (QID) | ORAL | Status: DC | PRN

## 2018-08-07 MED ORDER — PANTOPRAZOLE SODIUM 40 MG PO TBEC
40.0000 mg | DELAYED_RELEASE_TABLET | Freq: Every day | ORAL | Status: DC
  Administered 2018-08-07 – 2018-08-13 (×8): 40 mg via ORAL
  Filled 2018-08-07 (×8): qty 1

## 2018-08-07 MED ORDER — ACETAMINOPHEN 650 MG RE SUPP: 650.0000 mg | Freq: Four times a day (QID) | RECTAL | Status: DC | PRN

## 2018-08-07 MED ORDER — IPRATROPIUM-ALBUTEROL 0.5-2.5 (3) MG/3ML IN SOLN
3.0000 mL | Freq: Four times a day (QID) | RESPIRATORY_TRACT | Status: DC | PRN
Start: 2018-08-07 — End: 2018-08-10

## 2018-08-07 MED ORDER — GABAPENTIN 300 MG PO CAPS
300.0000 mg | ORAL_CAPSULE | Freq: Three times a day (TID) | ORAL | Status: DC
  Administered 2018-08-07 – 2018-08-14 (×21): 300 mg via ORAL
  Filled 2018-08-07 (×21): qty 1

## 2018-08-07 MED ORDER — FENTANYL CITRATE (PF) 100 MCG/2ML IJ SOLN
INTRAMUSCULAR | Status: DC | PRN
  Administered 2018-08-07 (×2): 50 ug via INTRAVENOUS

## 2018-08-07 MED ORDER — ACETAMINOPHEN 325 MG PO TABS
650.0000 mg | ORAL_TABLET | Freq: Four times a day (QID) | ORAL | Status: DC | PRN
  Administered 2018-08-08 – 2018-08-10 (×3): 650 mg via ORAL
  Filled 2018-08-07 (×3): qty 2

## 2018-08-07 MED ORDER — LIDOCAINE HCL 2 % EX GEL
1.0000 "application " | Freq: Once | CUTANEOUS | Status: DC
  Filled 2018-08-07: qty 5

## 2018-08-07 MED ORDER — OXYCODONE-ACETAMINOPHEN 5-325 MG PO TABS
1.0000 | ORAL_TABLET | Freq: Three times a day (TID) | ORAL | Status: DC | PRN
  Administered 2018-08-07 – 2018-08-09 (×4): 1 via ORAL
  Filled 2018-08-07 (×4): qty 1

## 2018-08-07 MED ORDER — OXYCODONE-ACETAMINOPHEN 5-325 MG PO TABS
1.0000 | ORAL_TABLET | Freq: Three times a day (TID) | ORAL | Status: DC | PRN
  Administered 2018-08-07: 1 via ORAL
  Filled 2018-08-07: qty 1

## 2018-08-07 MED ORDER — MONTELUKAST SODIUM 10 MG PO TABS
10.0000 mg | ORAL_TABLET | Freq: Every day | ORAL | Status: DC
  Administered 2018-08-07 – 2018-08-13 (×7): 10 mg via ORAL
  Filled 2018-08-07 (×7): qty 1

## 2018-08-07 MED ORDER — MIDAZOLAM HCL 5 MG/ML IJ SOLN
INTRAMUSCULAR | Status: AC
  Filled 2018-08-07: qty 2

## 2018-08-07 MED ORDER — HYDROXYZINE HCL 25 MG PO TABS
25.0000 mg | ORAL_TABLET | Freq: Three times a day (TID) | ORAL | Status: DC | PRN
  Administered 2018-08-08 – 2018-08-12 (×4): 25 mg via ORAL
  Filled 2018-08-07 (×4): qty 1

## 2018-08-07 MED ORDER — ALBUTEROL SULFATE (2.5 MG/3ML) 0.083% IN NEBU: 3.0000 mL | INHALATION_SOLUTION | Freq: Four times a day (QID) | RESPIRATORY_TRACT | Status: DC | PRN

## 2018-08-07 MED ORDER — FENTANYL CITRATE (PF) 100 MCG/2ML IJ SOLN
INTRAMUSCULAR | Status: AC
  Filled 2018-08-07: qty 4

## 2018-08-07 MED ORDER — LIDOCAINE HCL 1 % IJ SOLN
INTRAMUSCULAR | Status: DC | PRN
  Administered 2018-08-07: 6 mL via RESPIRATORY_TRACT

## 2018-08-07 MED ORDER — BUTAMBEN-TETRACAINE-BENZOCAINE 2-2-14 % EX AERO: 1.0000 | INHALATION_SPRAY | Freq: Once | CUTANEOUS | Status: DC

## 2018-08-07 MED ORDER — PHENYLEPHRINE HCL 0.25 % NA SOLN: 1.0000 | Freq: Four times a day (QID) | NASAL | Status: DC | PRN

## 2018-08-07 MED ORDER — ENSURE ENLIVE PO LIQD
237.0000 mL | Freq: Three times a day (TID) | ORAL | Status: DC
  Administered 2018-08-07 – 2018-08-14 (×14): 237 mL via ORAL

## 2018-08-07 MED ORDER — MIDAZOLAM HCL 10 MG/2ML IJ SOLN
INTRAMUSCULAR | Status: DC | PRN
  Administered 2018-08-07 (×2): 2 mg via INTRAVENOUS

## 2018-08-07 MED ORDER — SODIUM CHLORIDE 0.9 % IV SOLN
INTRAVENOUS | Status: DC
  Administered 2018-08-07 – 2018-08-09 (×6): via INTRAVENOUS
  Administered 2018-08-10: 75 mL/h via INTRAVENOUS
  Administered 2018-08-10 – 2018-08-13 (×6): via INTRAVENOUS

## 2018-08-07 MED ORDER — GADOBENATE DIMEGLUMINE 529 MG/ML IV SOLN
15.0000 mL | Freq: Once | INTRAVENOUS | Status: AC | PRN
Start: 2018-08-07 — End: 2018-08-07
  Administered 2018-08-07: 20 mL via INTRAVENOUS

## 2018-08-07 MED ORDER — AZELASTINE HCL 0.1 % NA SOLN
1.0000 | Freq: Two times a day (BID) | NASAL | Status: DC
  Administered 2018-08-08 – 2018-08-14 (×13): 1 via NASAL
  Filled 2018-08-07: qty 30

## 2018-08-07 MED ORDER — SODIUM CHLORIDE 0.9 % IV SOLN
INTRAVENOUS | Status: DC
  Administered 2018-08-07 – 2018-08-13 (×4): via INTRAVENOUS

## 2018-08-07 MED ORDER — IBUPROFEN 200 MG PO TABS
600.0000 mg | ORAL_TABLET | Freq: Four times a day (QID) | ORAL | Status: DC | PRN
  Administered 2018-08-07 – 2018-08-12 (×9): 600 mg via ORAL
  Filled 2018-08-07 (×11): qty 3

## 2018-08-07 MED ORDER — ONDANSETRON HCL 4 MG PO TABS: 4.0000 mg | ORAL_TABLET | Freq: Four times a day (QID) | ORAL | Status: DC | PRN

## 2018-08-07 MED ORDER — ONDANSETRON HCL 4 MG/2ML IJ SOLN: 4.0000 mg | Freq: Four times a day (QID) | INTRAMUSCULAR | Status: DC | PRN

## 2018-08-07 NOTE — Consult Note (Signed)
PULMONARY / CRITICAL CARE MEDICINE   Name: Ryan Houston MRN: [id number] DOB: May 22, 1946    ADMISSION DATE:  08/06/2018 CONSULTATION DATE:  08/07/2090  REFERRING MD:  Dr. Alcario Drought  CHIEF COMPLAINT:  Hemoptysis  HISTORY OF PRESENT ILLNESS:   72 yo male smoker developed hemoptysis about 2 weeks prior to admission.  He has also been getting discomfort in his right chest.  He has noticed some blurred vision and is more hoarse.  He has lost about 30 lbs over the past two months.  He had CT chest (reviewed by me) that showed mass in subcarinal area probably invading right main bronchus.  He started smoking at age 27.  He smoked up to 1 ppd.  He last smoked prior to this admission.  No prior history of pneumonia or exposure to TB.  Denies fever, sweats, skin rash, gland swelling, abdominal pain, diarrhea.  PAST MEDICAL HISTORY :  He  has a past medical history of Arthritis, Cough with hemoptysis (08/06/2018), GERD (gastroesophageal reflux disease), Hypercalcemia of malignancy (08/06/2018), Hypertension, and Mediastinal mass (08/06/2018).  PAST SURGICAL HISTORY: He  has a past surgical history that includes Back surgery.  No Known Allergies  No current facility-administered medications on file prior to encounter.    Current Outpatient Medications on File Prior to Encounter  Medication Sig  . albuterol (PROVENTIL HFA;VENTOLIN HFA) 108 (90 BASE) MCG/ACT inhaler Inhale 1-2 puffs into the lungs every 6 (six) hours as needed for wheezing.  . Ascorbic Acid (VITAMIN C) 1000 MG tablet Take 1,000 mg by mouth every other day.  Marland Kitchen azelastine (ASTELIN) 0.1 % nasal spray Place into both nostrils 2 (two) times daily. Use in each nostril as directed  . Cholecalciferol (VITAMIN D-3) 1000 UNITS CAPS Take 1,000 Units by mouth daily.   Marland Kitchen gabapentin (NEURONTIN) 300 MG capsule Take 300 mg by mouth 3 (three) times daily.  . hydrOXYzine (ATARAX/VISTARIL) 25 MG tablet Take 1 tablet (25 mg total) by mouth every 6 (six)  hours.  Marland Kitchen ibuprofen (ADVIL,MOTRIN) 800 MG tablet Take 800 mg by mouth 3 (three) times daily with meals. Pain  . montelukast (SINGULAIR) 10 MG tablet Take 10 mg by mouth at bedtime.  . Omega-3 Fatty Acids (FISH OIL PO) Take 1 capsule by mouth every other day.  Marland Kitchen omeprazole (PRILOSEC) 20 MG capsule Take 1 capsule (20 mg total) by mouth daily.  . vitamin B-12 (CYANOCOBALAMIN) 1000 MCG tablet Take 1,000 mcg by mouth every other day.  Marland Kitchen VITAMIN E PO Take 2 capsules by mouth every other day.  . oxyCODONE-acetaminophen (PERCOCET) 5-325 MG per tablet Take 1 tablet by mouth 2 (two) times daily. Pain  . Sofosbuvir-Velpatasvir (EPCLUSA) 400-100 MG TABS Take 1 tablet by mouth daily. (Patient not taking: Reported on 08/07/2018)    FAMILY HISTORY:  He denies family history of cirrhosis.  SOCIAL HISTORY: He  reports that he has been smoking cigarettes. He has been smoking about 1.00 pack per day. He has never used smokeless tobacco. He reports that he drinks alcohol. He reports that he has current or past drug history. Drug: Marijuana.  REVIEW OF SYSTEMS:   Negative except above  SUBJECTIVE:   VITAL SIGNS: BP 108/66 (BP Location: Left Arm)   Pulse 84   Temp 97.7 F (36.5 C)   Resp 18   Ht 5' 9.5" (1.765 m)   Wt 60.7 kg   SpO2 98%   BMI 19.49 kg/m   INTAKE / OUTPUT: I/O last 3 completed shifts: In: 43.4 [I.V.:43.4]  Out: -   PHYSICAL EXAMINATION:  General - alert Eyes - pupils reactive ENT - no sinus tenderness, raspy voice, no teeth on top, MP 2no stridor Cardiac - regular rate/rhythm, no murmur Chest - decreased BS, no wheeze, rales Abdomen - soft, non tender, + bowel sounds, no hepatosplenomegaly Extremities - no cyanosis, clubbing, or edema Skin - no rashes Lymphatics - no lymphadenopathy Neuro - CN intact, normal strength, moves extremities, follows commands Psych - normal mood and behavior    LABS:  BMET Recent Labs  Lab 08/06/18 1509 08/07/18 0442  NA 129 135  K 4.2  4.7  CL 100 102  CO2 25 27  BUN 18 21  CREATININE 1.40* 1.29*  GLUCOSE 143* 128*    Electrolytes Recent Labs  Lab 08/06/18 1509 08/07/18 0442  CALCIUM 10.4* 9.3    CBC Recent Labs  Lab 08/06/18 1509 08/07/18 0442  WBC 10.2* 9.7  HGB 15.2 14.1  HCT 44.3 41.2  PLT 280 259    Coag's No results for input(s): APTT, INR in the last 168 hours.  Sepsis Markers No results for input(s): LATICACIDVEN, PROCALCITON, O2SATVEN in the last 168 hours.  ABG No results for input(s): PHART, PCO2ART, PO2ART in the last 168 hours.  Liver Enzymes Recent Labs  Lab 08/06/18 1509  AST 165*  ALT 122*  ALKPHOS 78  BILITOT 0.7  ALBUMIN 3.1*    Cardiac Enzymes No results for input(s): TROPONINI, PROBNP in the last 168 hours.  Glucose No results for input(s): GLUCAP in the last 168 hours.  Imaging Ct Chest W Contrast  Result Date: 08/06/2018 CLINICAL DATA:  Hemoptysis for the past 2 weeks. Shortness of breath on exertion. Chest pain. Low back pain. Smoker. Large subcarinal mass, right hilar adenopathy, bilateral pleural plaques and possible left renal mass on a recent chest CT at [facility], dated 07/23/2018. EXAM: CT CHEST, ABDOMEN, AND PELVIS WITH CONTRAST TECHNIQUE: Multidetector CT imaging of the chest, abdomen and pelvis was performed following the standard protocol during bolus administration of intravenous contrast. CONTRAST:  116mL ISOVUE-300 IOPAMIDOL (ISOVUE-300) INJECTION 61% COMPARISON:  Chest radiographs obtained earlier today. Chest CT report dated 07/23/2018 from [facility] FINDINGS: CT CHEST FINDINGS Cardiovascular: Atheromatous calcifications, including the coronary arteries and aorta. Mediastinum/Nodes: Heterogeneous subcarinal mass invading and occluding the right lower lobe bronchus and bronchus intermedius. This mass measures 5.8 x 3.2 cm on image number 33 series 2. There is irregular soft tissue density extending in the peribronchial regions into the more  peripheral aspects of the right lower lobe. Similar-appearing enlarged right hilar lymph nodes. The largest has a short axis diameter of 16 mm on image number 38 of series 2, containing central low density. Mildly enlarged precarinal node with a short axis diameter of 11 mm on image number 23 series 2. Unremarkable included thyroid gland. Lungs/Pleura: Bilateral calcified pleural plaques. Extensive bilateral bullous changes. Mild right lower lobe atelectasis. Musculoskeletal: Mild thoracic spine degenerative changes. No evidence of bony metastatic disease. CT ABDOMEN PELVIS FINDINGS Hepatobiliary: Mild diffuse low density of the liver relative to the spleen. Normal appearing gallbladder. Pancreas: Unremarkable. No pancreatic ductal dilatation or surrounding inflammatory changes. Spleen: Normal in size without focal abnormality. Adrenals/Urinary Tract: Small upper pole left renal cyst. Normal appearing adrenal glands, right kidney, ureters and urinary bladder. Stomach/Bowel: Stomach is within normal limits. Appendix appears normal. No evidence of bowel wall thickening, distention, or inflammatory changes. Vascular/Lymphatic: Atheromatous arterial calcifications without aneurysm. No enlarged lymph nodes. Reproductive: Moderately enlarged prostate gland. Other: Small  right inguinal hernia containing fat and portion of a loop of small bowel without obstruction. Small left inguinal hernia containing fat. Musculoskeletal: Mild left and minimal right hip degenerative changes. Mild levoconvex lumbar scoliosis. Lumbar spine degenerative changes. No evidence of bony metastatic disease. IMPRESSION: 1. 5.8 x 3.2 cm subcarinal and medial right lower lobe lung mass invading and occluding the right lower lobe bronchus and bronchus intermedius. This is most compatible with a primary lung neoplasm. 2. Metastatic right hilar and precarinal adenopathy. 3. Probable tumor extending along the bronchi into the more peripheral portions of  the right lower lobe. 4. Mild right lower lobe atelectasis. 5. Centrilobular and paraseptal emphysema. 6. Bilateral calcified pleural plaques, compatible with previous asbestos exposure. 7.  Calcific coronary artery and aortic atherosclerosis. 8. Mild diffuse hepatic steatosis. 9. Moderate prostatic hypertrophy. 10. Small right inguinal hernia containing a portion of a small bowel loop without obstruction Electronically Signed   By: Claudie Revering M.D.   On: 08/06/2018 19:11   Mr Jeri Cos FI Contrast  Result Date: 08/07/2018 CLINICAL DATA:  Lung cancer. EXAM: MRI HEAD WITHOUT AND WITH CONTRAST TECHNIQUE: Multiplanar, multiecho pulse sequences of the brain and surrounding structures were obtained without and with intravenous contrast. CONTRAST:  53mL MULTIHANCE GADOBENATE DIMEGLUMINE 529 MG/ML IV SOLN COMPARISON:  None. FINDINGS: Brain: Scattered periventricular and subcortical white matter changes are moderately advanced for age. There is moderate atrophy. No acute infarct, hemorrhage, or mass lesion is present. No pathologic enhancement is present to suggest metastatic disease to the brain or meninges. The internal auditory canals are within normal limits bilaterally. The brainstem and cerebellum are normal. Vascular: Flow is present in the major intracranial arteries. Skull and upper cervical spine: Skull base is within normal limits. Craniocervical junction is normal. Sinuses/Orbits: The paranasal sinuses are clear. There is some fluid in the right mastoid air cells. No obstructing nasopharyngeal lesion is evident. Globes and orbits are within normal limits bilaterally. IMPRESSION: 1. No evidence for metastatic disease to the brain. 2. Atrophy and white matter changes are moderately advanced for age. This is nonspecific, but likely reflects the sequela of chronic microvascular ischemia. 3. Small right mastoid effusion. No obstructing nasopharyngeal lesion is present. Electronically Signed   By: San Morelle M.D.   On: 08/07/2018 08:21   Ct Abdomen Pelvis W Contrast  Result Date: 08/06/2018 CLINICAL DATA:  Hemoptysis for the past 2 weeks. Shortness of breath on exertion. Chest pain. Low back pain. Smoker. Large subcarinal mass, right hilar adenopathy, bilateral pleural plaques and possible left renal mass on a recent chest CT at [facility], dated 07/23/2018. EXAM: CT CHEST, ABDOMEN, AND PELVIS WITH CONTRAST TECHNIQUE: Multidetector CT imaging of the chest, abdomen and pelvis was performed following the standard protocol during bolus administration of intravenous contrast. CONTRAST:  145mL ISOVUE-300 IOPAMIDOL (ISOVUE-300) INJECTION 61% COMPARISON:  Chest radiographs obtained earlier today. Chest CT report dated 07/23/2018 from [facility] FINDINGS: CT CHEST FINDINGS Cardiovascular: Atheromatous calcifications, including the coronary arteries and aorta. Mediastinum/Nodes: Heterogeneous subcarinal mass invading and occluding the right lower lobe bronchus and bronchus intermedius. This mass measures 5.8 x 3.2 cm on image number 33 series 2. There is irregular soft tissue density extending in the peribronchial regions into the more peripheral aspects of the right lower lobe. Similar-appearing enlarged right hilar lymph nodes. The largest has a short axis diameter of 16 mm on image number 38 of series 2, containing central low density. Mildly enlarged precarinal node with a short axis diameter of  11 mm on image number 23 series 2. Unremarkable included thyroid gland. Lungs/Pleura: Bilateral calcified pleural plaques. Extensive bilateral bullous changes. Mild right lower lobe atelectasis. Musculoskeletal: Mild thoracic spine degenerative changes. No evidence of bony metastatic disease. CT ABDOMEN PELVIS FINDINGS Hepatobiliary: Mild diffuse low density of the liver relative to the spleen. Normal appearing gallbladder. Pancreas: Unremarkable. No pancreatic ductal dilatation or surrounding inflammatory changes.  Spleen: Normal in size without focal abnormality. Adrenals/Urinary Tract: Small upper pole left renal cyst. Normal appearing adrenal glands, right kidney, ureters and urinary bladder. Stomach/Bowel: Stomach is within normal limits. Appendix appears normal. No evidence of bowel wall thickening, distention, or inflammatory changes. Vascular/Lymphatic: Atheromatous arterial calcifications without aneurysm. No enlarged lymph nodes. Reproductive: Moderately enlarged prostate gland. Other: Small right inguinal hernia containing fat and portion of a loop of small bowel without obstruction. Small left inguinal hernia containing fat. Musculoskeletal: Mild left and minimal right hip degenerative changes. Mild levoconvex lumbar scoliosis. Lumbar spine degenerative changes. No evidence of bony metastatic disease. IMPRESSION: 1. 5.8 x 3.2 cm subcarinal and medial right lower lobe lung mass invading and occluding the right lower lobe bronchus and bronchus intermedius. This is most compatible with a primary lung neoplasm. 2. Metastatic right hilar and precarinal adenopathy. 3. Probable tumor extending along the bronchi into the more peripheral portions of the right lower lobe. 4. Mild right lower lobe atelectasis. 5. Centrilobular and paraseptal emphysema. 6. Bilateral calcified pleural plaques, compatible with previous asbestos exposure. 7.  Calcific coronary artery and aortic atherosclerosis. 8. Mild diffuse hepatic steatosis. 9. Moderate prostatic hypertrophy. 10. Small right inguinal hernia containing a portion of a small bowel loop without obstruction Electronically Signed   By: Claudie Revering M.D.   On: 08/06/2018 19:11     STUDIES:  CT chest 8/07 >> subcarinal mass 5.8 x 3.2 cmwith invasion/occlusion RLL bronchus and bronchus intermedius, 16 mm Rt hilar LAN, 11 mm precarinal LAN, b/l calcified pleural plaques, bullous emphysema (reviewed myself) CT abd/pelvis 8/07 >> Lt renal cyst, enlarged prostate, small Rt inguinal  hernia (reviewed myself) MRI brain 8/08 >> atrophy and white matter changes  SIGNIFICANT EVENTS: 8/07 Admit, oncology consulted  DISCUSSION: 72 yo male smoker with hemoptysis, hoarseness, weight loss.  Found to have subcarinal mass with invasion of right lung airways.  Also has pleural plaques.  Main concern is for primary lung cancer.  ASSESSMENT / PLAN:  Right lung mass with hemoptysis. - scheduled for bronchoscopy at 2 pm on 08/07/18 - procedure discussed with patient - risks are bleeding, infection, pneumothorax, and non diagnosis - he is agreeable to proceed  Bullous emphysema with history of smoking. - smoking cessation - will need outpt PFTs to assess for COPD  Pleural plaques. - no other evidence for asbestos related lung disease - monitor on follow up imaging studies  DVT prophylaxis - SCDs SUP - not indicate Nutrition - NPO for bronchoscopy Goals of care - full code  Chesley Mires, MD [place] 08/07/2018, 9:40 AM

## 2018-08-07 NOTE — Progress Notes (Signed)
Initial Nutrition Assessment  DOCUMENTATION CODES:   Non-severe (moderate) malnutrition in context of acute illness/injury  INTERVENTION:    Monitor for diet advancement/toleration  Ensure Enlive po TID, each supplement provides 350 kcal and 20 grams of protein once advanced.   NUTRITION DIAGNOSIS:   Moderate Malnutrition related to acute illness(new lung mass) as evidenced by severe muscle depletion, moderate fat depletion, moderate muscle depletion.  GOAL:   Patient will meet greater than or equal to 90% of their needs  MONITOR:   PO intake, Supplement acceptance, Weight trends, Labs, Diet advancement  REASON FOR ASSESSMENT:   Malnutrition Screening Tool    ASSESSMENT:   Patient with PMH significant for arthritis, GERD, and HTN. Presents this admission with with complaints of hemoptysis and discomfort in his right chest. CT scan revealed large subcarinal mass that is believed to be bronchogenic carcinoma. Plan for bronchoscopy 8/9.    Pt denies having a loss in appetite PTA. States he typically eats breakfast/lunch that his home aid makes (cereal with milk, sandwiches) and dinner from meals on wheels (meat, vegetable, protein, dessert). He denies noticing a decrease in meal size but states he continuously keeps losing weight. He has tried for months to get Ensure sent to his home because he cannot afford them with EBT, but has not had any luck. RD to look into this. Pt is currently NPO for bronchoscopy but is very eager to eat. Will provided supplements once advanced.   Pt endorses a UBW of 160 lb and a recent wt loss of 30 lb. Records are limited in weight history making it hard to confirm this information.   Medications reviewed and include: NS @ 75 ml/hr Labs reviewed.   NUTRITION - FOCUSED PHYSICAL EXAM:  Nutrition-Focused physical exam completed. Findings are moderate fat depletion, moderate to severe muscle depletion, and no edema.    Diet Order:   Diet Order             Diet NPO time specified  Diet effective midnight              EDUCATION NEEDS:   Education needs have been addressed  Skin:  Skin Assessment: Reviewed RN Assessment  Last BM:  08/06/18  Height:   Ht Readings from Last 1 Encounters:  08/07/18 5' 9.5" (1.765 m)    Weight:   Wt Readings from Last 1 Encounters:  08/07/18 60.7 kg    Ideal Body Weight:  75.5 kg  BMI:  Body mass index is 19.49 kg/m.  Estimated Nutritional Needs:   Kcal:  2150-2350 kcal  Protein:  105-120 grams   Fluid:  >/= 2.1 L/day    Mariana Single RD, LDN Clinical Nutrition Pager # - [phone]

## 2018-08-07 NOTE — Progress Notes (Signed)
Ryan Houston is a 72 y.o. male with medical history significant of HCV, never took treatment for this.  Patient has 1 month h/o hemoptysis, recent weight loss.  Recently diagnosed with lung mass at a local ED.  08/07/18: Seen and examined at his bedside.  Reports dyspnea with minimal exertion.  Right lower lobe mass invading the right lower bronchus and bronchus intermedius.  MRI brain unremarkable for any acute intracranial findings.  Pulmonology and oncology following.  Highly appreciated  Please refer to H&P dictated by Dr. Alcario Drought on 08/07/2018 for further details of the assessment and plan.

## 2018-08-07 NOTE — Op Note (Signed)
[facility] Cardiopulmonary Patient Name: Ryan Houston Procedure Date: 08/07/2018 MRN: [id number] Attending MD: Chesley Mires , MD Date of Birth: April 28, 1946 CSN: [id number] Age: 72 Admit Type: Inpatient Ethnicity: Not Hispanic or Latino Procedure:            Bronchoscopy Indications:          Bronchus intermedius mass Providers:            Chesley Mires, MD, Salvadore Dom, ACNP, Andre Lefort                        RRT,RCP, Ashley Mariner RRT,RCP Referring MD:          Medicines:            Fentanyl 100 mcg IV, Midazolam 4 mg IV, Lidocaine 2%                        applied to cords 6 mL Complications:        No immediate complications Estimated Blood Loss: Estimated blood loss: none. Procedure:      Pre-Anesthesia Assessment:      - A History and Physical has been performed. The patient's medications,       allergies and sensitivities have been reviewed.      - The risks and benefits of the procedure and the sedation options and       risks were discussed with the patient. All questions were answered and       informed consent was obtained.      After obtaining informed consent, the bronchoscope was passed under       direct vision. Throughout the procedure, the patient's blood pressure,       pulse, and oxygen saturations were monitored continuously. the [id number]       ([id number]) Olympus Therapeutic Bronchoscope was introduced through the       mouth and advanced to the tracheobronchial tree of both lungs. The       procedure was accomplished without difficulty. The patient tolerated the       procedure well. The total duration of the procedure was 9 minutes. Findings:      The oropharynx appears normal. The larynx appears normal. The vocal       cords appear normal. The subglottic space is normal. The trachea is of       normal caliber. The carina is sharp. The tracheobronchial tree of the       left lung was examined to at least the first subsegmental level.       Bronchial mucosa and anatomy in the left lung are normal; there are no       endobronchial lesions, and no secretions.      Right Lung Abnormalities: A partially obstructing (about 70% obstructed)       mass lesion was found proximally in the bronchus intermedius.       Endobronchial biopsies were performed with four biopsy specimens       obtained. Estimated blood loss: none. Could not visualize right middle       and right lower lobe orifice openings. Impression:      - The airway examination of the left lung was normal.      - A lesion was found in the bronchus intermedius.      - An endobronchial biopsy was performed. Moderate Sedation:      Moderate (conscious) sedation was personally administered by  the       endoscopist. The following parameters were monitored: oxygen saturation,       heart rate, blood pressure, and response to care. Total physician       intraservice time was 9 minutes. Recommendation:      - Await test results. Procedure Code(s):      --- Professional ---      [phone], Bronchoscopy, rigid or flexible, including fluoroscopic guidance,       when performed; with bronchial or endobronchial biopsy(s), single or       multiple sites Diagnosis Code(s):      --- Professional ---      J98.09, Other diseases of bronchus, not elsewhere classified      R91.8, Other nonspecific abnormal finding of lung field CPT copyright 2017 [employer]. All rights reserved. The codes documented in this report are preliminary and upon coder review may  be revised to meet current compliance requirements. Chesley Mires, MD Chesley Mires, MD 08/07/2018 2:00:49 PM This report has been signed electronically. Number of Addenda: 0 Scope In: 1:30:39 PM Scope Out: 1:39:32 PM

## 2018-08-07 NOTE — H&P (Signed)
History and Physical    Ryan Houston RFF:[id number] DOB: Sep 10, 1946 DOA: 08/06/2018  PCP: Rogers Blocker, MD  Patient coming from: Home   I have personally briefly reviewed patient's old medical records in [place]  Chief Complaint: Hemoptysis, lung mass  HPI: Ryan Houston is a 72 y.o. male with medical history significant of HCV, never took treatment for this.  Patient has 1 month h/o hemoptysis, recent weight loss.  Recently diagnosed with lung mass at a local ED.  Patient saw Dr. Marin Olp in office today who requested patient be admitted for further work up (see office note).   Review of Systems: As per HPI otherwise 10 point review of systems negative.   Past Medical History:  Diagnosis Date  . Arthritis   . Cough with hemoptysis 08/06/2018  . GERD (gastroesophageal reflux disease)   . Hypercalcemia of malignancy 08/06/2018  . Hypertension   . Mediastinal mass 08/06/2018    Past Surgical History:  Procedure Laterality Date  . BACK SURGERY       reports that he has been smoking cigarettes. He has been smoking about 1.00 pack per day. He has never used smokeless tobacco. He reports that he drinks alcohol. He reports that he has current or past drug history. Drug: Marijuana.  No Known Allergies  No family history on file.   Prior to Admission medications   Medication Sig Start Date End Date Taking? Authorizing Provider  albuterol (PROVENTIL HFA;VENTOLIN HFA) 108 (90 BASE) MCG/ACT inhaler Inhale 1-2 puffs into the lungs every 6 (six) hours as needed for wheezing. 07/05/12 07/05/13  Lacretia Leigh, MD  azelastine (ASTELIN) 0.1 % nasal spray Place into both nostrils 2 (two) times daily. Use in each nostril as directed    [provider]  Cholecalciferol (VITAMIN D-3) 1000 UNITS CAPS Take by mouth.    [provider]  gabapentin (NEURONTIN) 300 MG capsule Take 300 mg by mouth 3 (three) times daily.    [provider]  hydrOXYzine  (ATARAX/VISTARIL) 25 MG tablet Take 1 tablet (25 mg total) by mouth every 6 (six) hours. 12/06/12   Montine Circle, PA-C  ibuprofen (ADVIL,MOTRIN) 800 MG tablet Take 800 mg by mouth 3 (three) times daily with meals. Pain    [provider]  montelukast (SINGULAIR) 10 MG tablet Take 10 mg by mouth at bedtime.    [provider]  omeprazole (PRILOSEC) 20 MG capsule Take 1 capsule (20 mg total) by mouth daily. 12/06/12   Montine Circle, PA-C  oxyCODONE-acetaminophen (PERCOCET) 5-325 MG per tablet Take 1 tablet by mouth 2 (two) times daily. Pain    [provider]  Sofosbuvir-Velpatasvir (EPCLUSA) 400-100 MG TABS Take 1 tablet by mouth daily. 03/26/16   Thayer Headings, MD    Physical Exam: Vitals:   08/06/18 2004 08/06/18 2100 08/06/18 2321 08/07/18 0225  BP:  135/69 103/68 108/66  Pulse:  83 85 84  Resp:  18 18 18   Temp:  98.2 F (36.8 C) 98.3 F (36.8 C) 97.7 F (36.5 C)  TempSrc:  Oral Oral   SpO2: 94% 95% 95% 98%  Weight:    60.7 kg  Height:    5' 9.5" (1.765 m)    Constitutional: NAD, calm, comfortable Eyes: PERRL, lids and conjunctivae normal ENMT: Mucous membranes are moist. Posterior pharynx clear of any exudate or lesions.Normal dentition.  Neck: normal, supple, no masses, no thyromegaly Respiratory: clear to auscultation bilaterally, no wheezing, no crackles. Normal respiratory effort. No accessory  muscle use.  Cardiovascular: Regular rate and rhythm, no murmurs / rubs / gallops. No extremity edema. 2+ pedal pulses. No carotid bruits.  Abdomen: no tenderness, no masses palpated. No hepatosplenomegaly. Bowel sounds positive.  Musculoskeletal: no clubbing / cyanosis. No joint deformity upper and lower extremities. Good ROM, no contractures. Normal muscle tone.  Skin: no rashes, lesions, ulcers. No induration Neurologic: CN 2-12 grossly intact. Sensation intact, DTR normal. Strength 5/5 in all 4.  Psychiatric: Normal judgment and insight. Alert and  oriented x 3. Normal mood.    Labs on Admission: I have personally reviewed following labs and imaging studies  CBC: Recent Labs  Lab 08/06/18 1509  WBC 10.2*  NEUTROABS 6.9*  HGB 15.2  HCT 44.3  MCV 92.7  PLT 409   Basic Metabolic Panel: Recent Labs  Lab 08/06/18 1509  NA 129  K 4.2  CL 100  CO2 25  GLUCOSE 143*  BUN 18  CREATININE 1.40*  CALCIUM 10.4*   GFR: Estimated Creatinine Clearance: 40.9 mL/min (A) (by C-G formula based on SCr of 1.4 mg/dL (H)). Liver Function Tests: Recent Labs  Lab 08/06/18 1509  AST 165*  ALT 122*  ALKPHOS 78  BILITOT 0.7  PROT 10.8*  ALBUMIN 3.1*   No results for input(s): LIPASE, AMYLASE in the last 168 hours. No results for input(s): AMMONIA in the last 168 hours. Coagulation Profile: No results for input(s): INR, PROTIME in the last 168 hours. Cardiac Enzymes: No results for input(s): CKTOTAL, CKMB, CKMBINDEX, TROPONINI in the last 168 hours. BNP (last 3 results) No results for input(s): PROBNP in the last 8760 hours. HbA1C: No results for input(s): HGBA1C in the last 72 hours. CBG: No results for input(s): GLUCAP in the last 168 hours. Lipid Profile: No results for input(s): CHOL, HDL, LDLCALC, TRIG, CHOLHDL, LDLDIRECT in the last 72 hours. Thyroid Function Tests: No results for input(s): TSH, T4TOTAL, FREET4, T3FREE, THYROIDAB in the last 72 hours. Anemia Panel: No results for input(s): VITAMINB12, FOLATE, FERRITIN, TIBC, IRON, RETICCTPCT in the last 72 hours. Urine analysis: No results found for: COLORURINE, APPEARANCEUR, [place], [place], GLUCOSEU, HGBUR, BILIRUBINUR, KETONESUR, PROTEINUR, UROBILINOGEN, NITRITE, LEUKOCYTESUR  Radiological Exams on Admission: Ct Chest W Contrast  Result Date: 08/06/2018 CLINICAL DATA:  Hemoptysis for the past 2 weeks. Shortness of breath on exertion. Chest pain. Low back pain. Smoker. Large subcarinal mass, right hilar adenopathy, bilateral pleural plaques and possible left renal  mass on a recent chest CT at [facility], dated 07/23/2018. EXAM: CT CHEST, ABDOMEN, AND PELVIS WITH CONTRAST TECHNIQUE: Multidetector CT imaging of the chest, abdomen and pelvis was performed following the standard protocol during bolus administration of intravenous contrast. CONTRAST:  145mL ISOVUE-300 IOPAMIDOL (ISOVUE-300) INJECTION 61% COMPARISON:  Chest radiographs obtained earlier today. Chest CT report dated 07/23/2018 from [facility]. FINDINGS: CT CHEST FINDINGS Cardiovascular: Atheromatous calcifications, including the coronary arteries and aorta. Mediastinum/Nodes: Heterogeneous subcarinal mass invading and occluding the right lower lobe bronchus and bronchus intermedius. This mass measures 5.8 x 3.2 cm on image number 33 series 2. There is irregular soft tissue density extending in the peribronchial regions into the more peripheral aspects of the right lower lobe. Similar-appearing enlarged right hilar lymph nodes. The largest has a short axis diameter of 16 mm on image number 38 of series 2, containing central low density. Mildly enlarged precarinal node with a short axis diameter of 11 mm on image number 23 series 2. Unremarkable included thyroid gland. Lungs/Pleura: Bilateral calcified pleural plaques. Extensive bilateral bullous changes.  Mild right lower lobe atelectasis. Musculoskeletal: Mild thoracic spine degenerative changes. No evidence of bony metastatic disease. CT ABDOMEN PELVIS FINDINGS Hepatobiliary: Mild diffuse low density of the liver relative to the spleen. Normal appearing gallbladder. Pancreas: Unremarkable. No pancreatic ductal dilatation or surrounding inflammatory changes. Spleen: Normal in size without focal abnormality. Adrenals/Urinary Tract: Small upper pole left renal cyst. Normal appearing adrenal glands, right kidney, ureters and urinary bladder. Stomach/Bowel: Stomach is within normal limits. Appendix appears normal. No evidence of bowel wall thickening, distention, or  inflammatory changes. Vascular/Lymphatic: Atheromatous arterial calcifications without aneurysm. No enlarged lymph nodes. Reproductive: Moderately enlarged prostate gland. Other: Small right inguinal hernia containing fat and portion of a loop of small bowel without obstruction. Small left inguinal hernia containing fat. Musculoskeletal: Mild left and minimal right hip degenerative changes. Mild levoconvex lumbar scoliosis. Lumbar spine degenerative changes. No evidence of bony metastatic disease. IMPRESSION: 1. 5.8 x 3.2 cm subcarinal and medial right lower lobe lung mass invading and occluding the right lower lobe bronchus and bronchus intermedius. This is most compatible with a primary lung neoplasm. 2. Metastatic right hilar and precarinal adenopathy. 3. Probable tumor extending along the bronchi into the more peripheral portions of the right lower lobe. 4. Mild right lower lobe atelectasis. 5. Centrilobular and paraseptal emphysema. 6. Bilateral calcified pleural plaques, compatible with previous asbestos exposure. 7.  Calcific coronary artery and aortic atherosclerosis. 8. Mild diffuse hepatic steatosis. 9. Moderate prostatic hypertrophy. 10. Small right inguinal hernia containing a portion of a small bowel loop without obstruction Electronically Signed   By: Claudie Revering M.D.   On: 08/06/2018 19:11   Ct Abdomen Pelvis W Contrast  Result Date: 08/06/2018 CLINICAL DATA:  Hemoptysis for the past 2 weeks. Shortness of breath on exertion. Chest pain. Low back pain. Smoker. Large subcarinal mass, right hilar adenopathy, bilateral pleural plaques and possible left renal mass on a recent chest CT at [facility], dated 07/23/2018. EXAM: CT CHEST, ABDOMEN, AND PELVIS WITH CONTRAST TECHNIQUE: Multidetector CT imaging of the chest, abdomen and pelvis was performed following the standard protocol during bolus administration of intravenous contrast. CONTRAST:  155mL ISOVUE-300 IOPAMIDOL (ISOVUE-300) INJECTION 61%  COMPARISON:  Chest radiographs obtained earlier today. Chest CT report dated 07/23/2018 from [facility]. FINDINGS: CT CHEST FINDINGS Cardiovascular: Atheromatous calcifications, including the coronary arteries and aorta. Mediastinum/Nodes: Heterogeneous subcarinal mass invading and occluding the right lower lobe bronchus and bronchus intermedius. This mass measures 5.8 x 3.2 cm on image number 33 series 2. There is irregular soft tissue density extending in the peribronchial regions into the more peripheral aspects of the right lower lobe. Similar-appearing enlarged right hilar lymph nodes. The largest has a short axis diameter of 16 mm on image number 38 of series 2, containing central low density. Mildly enlarged precarinal node with a short axis diameter of 11 mm on image number 23 series 2. Unremarkable included thyroid gland. Lungs/Pleura: Bilateral calcified pleural plaques. Extensive bilateral bullous changes. Mild right lower lobe atelectasis. Musculoskeletal: Mild thoracic spine degenerative changes. No evidence of bony metastatic disease. CT ABDOMEN PELVIS FINDINGS Hepatobiliary: Mild diffuse low density of the liver relative to the spleen. Normal appearing gallbladder. Pancreas: Unremarkable. No pancreatic ductal dilatation or surrounding inflammatory changes. Spleen: Normal in size without focal abnormality. Adrenals/Urinary Tract: Small upper pole left renal cyst. Normal appearing adrenal glands, right kidney, ureters and urinary bladder. Stomach/Bowel: Stomach is within normal limits. Appendix appears normal. No evidence of bowel wall thickening, distention, or inflammatory changes. Vascular/Lymphatic: Atheromatous arterial calcifications  without aneurysm. No enlarged lymph nodes. Reproductive: Moderately enlarged prostate gland. Other: Small right inguinal hernia containing fat and portion of a loop of small bowel without obstruction. Small left inguinal hernia containing fat. Musculoskeletal: Mild  left and minimal right hip degenerative changes. Mild levoconvex lumbar scoliosis. Lumbar spine degenerative changes. No evidence of bony metastatic disease. IMPRESSION: 1. 5.8 x 3.2 cm subcarinal and medial right lower lobe lung mass invading and occluding the right lower lobe bronchus and bronchus intermedius. This is most compatible with a primary lung neoplasm. 2. Metastatic right hilar and precarinal adenopathy. 3. Probable tumor extending along the bronchi into the more peripheral portions of the right lower lobe. 4. Mild right lower lobe atelectasis. 5. Centrilobular and paraseptal emphysema. 6. Bilateral calcified pleural plaques, compatible with previous asbestos exposure. 7.  Calcific coronary artery and aortic atherosclerosis. 8. Mild diffuse hepatic steatosis. 9. Moderate prostatic hypertrophy. 10. Small right inguinal hernia containing a portion of a small bowel loop without obstruction Electronically Signed   By: Claudie Revering M.D.   On: 08/06/2018 19:11    EKG: Independently reviewed.  Assessment/Plan Principal Problem:   Lung mass Active Problems:   Chronic hepatitis C without hepatic coma (HCC)   Cough with hemoptysis   Hypercalcemia of malignancy    1. Lung mass - 1. Dr. Marin Olp wants: Bronchoscopy, MRI brain, NM bone scan 2. Pulm consulted and will see patient in AM 3. Will keep patient NPO for possible bronch 4. MRI brain and NM bone scan have been ordered. 2. Hypercalcemia of malignancy - 1. IVF 2. Repeat BMP in AM 3. PTHrp ordered 3. Chronic HCV - 1. Never took the [facility] / riba prescribed Dec 2016. 2. Likely to need to start this treatment per Dr. Antonieta Pert note.  But ill hold off on starting this AM as he has enough on his plate for today.  DVT prophylaxis: SCDs - due to hemoptysis Code Status: Full Family Communication: No family in room Disposition Plan: Home after admit Consults called: Pulm called by EDP Admission status: Admit to inpatient   Etta Quill DO [facility] Pager [phone] Only works nights!  If 7AM-7PM, please contact the primary day team physician taking care of patient  [email or web address] Password [facility]  08/07/2018, 4:32 AM

## 2018-08-07 NOTE — Progress Notes (Signed)
Ryan Houston was admitted last night.  He had a CT scan of the chest and abdomen done.  It shows a large subcarinal mass.  Again, I have to believe that this is bronchogenic carcinoma.  He is going for MRI today.  Hopefully, he will be seen by pulmonary today and have a bronchoscopy tomorrow.  I suspect that he is going to need at least radiation therapy.  By the CT scan, looks like there is some obstruction of the right mainstem bronchus.  His hypercalcemia is a little bit better.  His calcium is 9.3 this morning.  For right now, everything really is going come down to the bronchoscopy.  I would also get radiation oncology to see him as with hemoptysis, radiation therapy is effective to minimize it.  Hopefully, he will not have any brain metastasis..  We will just follow along.  I appreciate everybody's care of him on 4 W.  Lattie Haw, MD  Psalm 91:1-2

## 2018-08-07 NOTE — Progress Notes (Signed)
Video Bronchoscopy done Intervention Bronchial washing done Procedure tolerated well 

## 2018-08-08 ENCOUNTER — Inpatient Hospital Stay (HOSPITAL_COMMUNITY): Payer: Medicare Other

## 2018-08-08 ENCOUNTER — Inpatient Hospital Stay
Admission: RE | Admit: 2018-08-08 | Discharge: 2018-08-08 | Disposition: A | Discharge status: Home / Self Care | Payer: Medicare Other | Source: Ambulatory Visit | Attending: Radiation Oncology | Admitting: Radiation Oncology

## 2018-08-08 ENCOUNTER — Encounter (HOSPITAL_COMMUNITY): Payer: Self-pay | Admitting: Pulmonary Disease

## 2018-08-08 DIAGNOSIS — R918 Other nonspecific abnormal finding of lung field: Secondary | ICD-10-CM

## 2018-08-08 DIAGNOSIS — E440 Moderate protein-calorie malnutrition: Secondary | ICD-10-CM

## 2018-08-08 DIAGNOSIS — R042 Hemoptysis: Secondary | ICD-10-CM

## 2018-08-08 HISTORY — PX: IR IMAGING GUIDED PORT INSERTION: IMG5740

## 2018-08-08 LAB — CBC
HEMATOCRIT: 37.2 % — AB (ref 39.0–52.0)
Hemoglobin: 12.4 g/dL — ABNORMAL LOW (ref 13.0–17.0)
MCH: 31.8 pg (ref 26.0–34.0)
MCHC: 33.3 g/dL (ref 30.0–36.0)
MCV: 95.4 fL (ref 78.0–100.0)
Platelets: 223 10*3/uL (ref 150–400)
RBC: 3.9 MIL/uL — ABNORMAL LOW (ref 4.22–5.81)
RDW: 12.9 % (ref 11.5–15.5)
WBC: 9.4 10*3/uL (ref 4.0–10.5)

## 2018-08-08 LAB — PROTIME-INR
INR: 1.17
PROTHROMBIN TIME: 14.8 s (ref 11.4–15.2)

## 2018-08-08 LAB — COMPREHENSIVE METABOLIC PANEL
ALK PHOS: 82 U/L (ref 38–126)
ALT: 86 U/L — ABNORMAL HIGH (ref 0–44)
ANION GAP: 7 (ref 5–15)
AST: 101 U/L — ABNORMAL HIGH (ref 15–41)
Albumin: 2.4 g/dL — ABNORMAL LOW (ref 3.5–5.0)
BILIRUBIN TOTAL: 0.3 mg/dL (ref 0.3–1.2)
BUN: 18 mg/dL (ref 8–23)
CO2: 25 mmol/L (ref 22–32)
Calcium: 8.6 mg/dL — ABNORMAL LOW (ref 8.9–10.3)
Chloride: 104 mmol/L (ref 98–111)
Creatinine, Ser: 1.17 mg/dL (ref 0.61–1.24)
GFR calc non Af Amer: >60 mL/min (ref >60)
Glucose, Bld: 142 mg/dL — ABNORMAL HIGH (ref 70–99)
POTASSIUM: 4.2 mmol/L (ref 3.5–5.1)
Sodium: 136 mmol/L (ref 135–145)
TOTAL PROTEIN: 7.7 g/dL (ref 6.5–8.1)

## 2018-08-08 LAB — MAGNESIUM: MAGNESIUM: 1.3 mg/dL — AB (ref 1.7–2.4)

## 2018-08-08 MED ORDER — MAGNESIUM SULFATE 2 GM/50ML IV SOLN: 2.0000 g | Freq: Once | INTRAVENOUS | Status: DC

## 2018-08-08 MED ORDER — MIDAZOLAM HCL 2 MG/2ML IJ SOLN
INTRAMUSCULAR | Status: AC
  Filled 2018-08-08: qty 4

## 2018-08-08 MED ORDER — LIDOCAINE-EPINEPHRINE (PF) 2 %-1:200000 IJ SOLN
INTRAMUSCULAR | Status: AC
  Filled 2018-08-08: qty 20

## 2018-08-08 MED ORDER — FENTANYL CITRATE (PF) 100 MCG/2ML IJ SOLN
INTRAMUSCULAR | Status: AC | PRN
  Administered 2018-08-08 (×2): 50 ug via INTRAVENOUS

## 2018-08-08 MED ORDER — TECHNETIUM TC 99M MEDRONATE IV KIT
20.0000 | PACK | Freq: Once | INTRAVENOUS | Status: AC | PRN
  Administered 2018-08-08: 21.5 via INTRAVENOUS

## 2018-08-08 MED ORDER — FENTANYL CITRATE (PF) 100 MCG/2ML IJ SOLN
INTRAMUSCULAR | Status: AC
  Filled 2018-08-08: qty 2

## 2018-08-08 MED ORDER — CEFAZOLIN SODIUM-DEXTROSE 2-4 GM/100ML-% IV SOLN
INTRAVENOUS | Status: AC
  Filled 2018-08-08: qty 100

## 2018-08-08 MED ORDER — MIDAZOLAM HCL 2 MG/2ML IJ SOLN
INTRAMUSCULAR | Status: AC | PRN
  Administered 2018-08-08: 2 mg via INTRAVENOUS

## 2018-08-08 MED ORDER — MAGNESIUM SULFATE 4 GM/100ML IV SOLN
4.0000 g | Freq: Once | INTRAVENOUS | Status: AC
  Administered 2018-08-08: 4 g via INTRAVENOUS
  Filled 2018-08-08: qty 100

## 2018-08-08 MED ORDER — LIDOCAINE-EPINEPHRINE (PF) 2 %-1:200000 IJ SOLN
INTRAMUSCULAR | Status: AC | PRN
  Administered 2018-08-08: 10 mL

## 2018-08-08 MED ORDER — NICOTINE 21 MG/24HR TD PT24
21.0000 mg | MEDICATED_PATCH | TRANSDERMAL | Status: DC
  Administered 2018-08-08 – 2018-08-13 (×6): 21 mg via TRANSDERMAL
  Filled 2018-08-08 (×6): qty 1

## 2018-08-08 NOTE — Progress Notes (Signed)
Went to visit pt, but he was not in room.  Pathology still pending.  Will f/u after pathology results available.  Chesley Mires, MD [facility] Pulmonary/Critical Care 08/08/2018, 1:25 PM

## 2018-08-08 NOTE — Progress Notes (Signed)
Patient has had increase in anxiety since return from Port-a-cath insertion. PRN med given. Having shaking and expresses he has problems with his nerves. Will continue to monitor. Eulas Post, RN

## 2018-08-08 NOTE — Progress Notes (Signed)
I am very impressed with how quickly the work-up for [name] has moved.  He underwent bronchoscopy yesterday.  There was a mass noted that was partially obstructing his right mainstem bronchus.  Biopsies were taken.  I have to believe that this is going to be either squamous cell carcinoma or small cell lung cancer.  He had MRI of the brain that was negative for any metastasis.  He goes for a bone scan today.  He had an episode of hemoptysis this morning.  I suspect that he will not have metastatic disease.  I suspect that he would be a candidate for combination radiation therapy and chemotherapy.  Please have radiation oncology see him.  He will also need to have a Port-A-Cath placed.  He has hepatitis C.  He has Harvoni.  He really needs to start taking this.  If not, he will might be hard to treat him aggressively as he likely would become neutropenic and this might worsen the hepatitis C.  His labs today show white cell count 9.4.  Hemoglobin 12.4.  Platelet count 223,000.  His creatinine is 1.17.  His calcium 8.6 with an albumin of 2.4.  Again, I am incredibly impressed with everybody's help.  Hopefully, there will be a preliminary path on him today.  Again, he must be seen by radiation oncology.  Please consult them.  I will see by having a Port-A-Cath placed.  This likely will not be able to be placed until Monday.  He has hemoptysis.  It might be hard to discharge him until we start treatment to try to minimize the risk of significant hemoptysis.  His hemoglobin is dropping slowly.  This might be hydration or this might be reflective of any bleeding.   Lattie Haw, MD  Psalm 91: 1-2

## 2018-08-08 NOTE — Progress Notes (Signed)
Radiation Oncology         [phone] ________________________________  Name: Ryan Houston        MRN: [id number]  Date of Service: 08/06/2018 DOB: [birth year]  HQ:PRFF, Ryan Littler, MD  No ref. provider found     REFERRING PHYSICIAN: No ref. provider found   DIAGNOSIS: The primary encounter diagnosis was Right lower lobe lung mass. Diagnoses of Hemoptysis, Lung cancer ([place]), and Malignant neoplasm of lung, unspecified laterality, unspecified part of lung ([place]) were also pertinent to this visit.   HISTORY OF PRESENT ILLNESS: MYLES Houston is a 72 y.o. male seen at the request of Dr. Marin Olp.  He reports a 2-3 week history of cough with hemoptysis, prompting him to seek emergency evaluation with CT Chest imaging at [facility] on 07/23/18 which demonstrated a large subcarinal mass with mass effect and possible invasion of right mainstem bronchus and with associated right hilar adenopathy.  He was discharged with orders to follow up with outpatient oncologist, Dr. Marin Olp. He saw Dr. Marin Olp on 08/06/18 who requested he be admitted to the hospital for further evaluation on an urgent basis due to hemoptysis.  He reports a 30 lb unintentional weight loss over the past 6 months or so.  He has had some shortness of breath with exertion and substernal chest pain intermittently.  He denies fevers, chills, N/V or diarrhea.  He also request increased fatigue/malaise over the past week. He does have a longstanding h/o Hepatitis C, untreated.  He had CT C/A/P on admission which shows a 5.8 x 3.2 cm subcarinal and medial right lower lobe lung mass invading and occluding the right lower lobe bronchus and bronchus intermedius- most compatible with a primary lung neoplasm. Additionally, there is metastatic right hilar and precarinal adenopathy. No evidence of LAD in abdomen or pelvis.   MRI brain was performed 08/07/18 and was negative for metastatic disease to the brain.  He underwent bronchoscopy for tissue  confirmation with Dr. Halford Houston on 08/07/18 and final pathology remains pending.  We have been asked to consult and discuss the potential role for radiotherapy in the management of his disease.  PREVIOUS RADIATION THERAPY: No   PAST MEDICAL HISTORY:  Past Medical History:  Diagnosis Date  . Arthritis   . Cough with hemoptysis 08/06/2018  . GERD (gastroesophageal reflux disease)   . Hepatitis C   . Hypercalcemia of malignancy 08/06/2018  . Hypertension   . Mediastinal mass 08/06/2018       PAST SURGICAL HISTORY: Past Surgical History:  Procedure Laterality Date  . BACK SURGERY    . VIDEO BRONCHOSCOPY Bilateral 08/07/2018   Procedure: VIDEO BRONCHOSCOPY WITH FLUORO;  Surgeon: Chesley Mires, MD;  Location: [facility];  Service: Endoscopy;  Laterality: Bilateral;     FAMILY HISTORY: History reviewed. No pertinent family history.   SOCIAL HISTORY:  reports that he has been smoking cigarettes. He has been smoking about 1.00 pack per day. He has never used smokeless tobacco. He reports that he drinks alcohol. He reports that he has current or past drug history. Drug: Marijuana.  He lives alone and does not drive himself.   ALLERGIES: Patient has no known allergies.   MEDICATIONS:  Current Facility-Administered Medications  Medication Dose Route Frequency Provider Last Rate Last Dose  . 0.9 %  sodium chloride infusion   Intravenous Continuous Etta Quill, DO 75 mL/hr at 08/08/18 0358    . 0.9 %  sodium chloride infusion   Intravenous Continuous Chesley Mires, MD  10 mL/hr at 08/07/18 1305    . acetaminophen (TYLENOL) tablet 650 mg  650 mg Oral Q6H PRN Etta Quill, DO       Or  . acetaminophen (TYLENOL) suppository 650 mg  650 mg Rectal Q6H PRN Etta Quill, DO      . azelastine (ASTELIN) 0.1 % nasal spray 1 spray  1 spray Each Nare BID Etta Quill, DO   1 spray at 08/08/18 0920  . ceFAZolin (ANCEF) 2-4 GM/100ML-% IVPB           . feeding supplement (ENSURE ENLIVE) (ENSURE  ENLIVE) liquid 237 mL  237 mL Oral TID BM Hall, Carole N, DO   237 mL at 08/08/18 0920  . fentaNYL (SUBLIMAZE) 100 MCG/2ML injection           . gabapentin (NEURONTIN) capsule 300 mg  300 mg Oral TID Etta Quill, DO   300 mg at 08/08/18 0920  . hydrOXYzine (ATARAX/VISTARIL) tablet 25 mg  25 mg Oral TID PRN Etta Quill, DO      . ibuprofen (ADVIL,MOTRIN) tablet 600 mg  600 mg Oral Q6H PRN Minda Ditto, RPH   600 mg at 08/07/18 2326  . ipratropium-albuterol (DUONEB) 0.5-2.5 (3) MG/3ML nebulizer solution 3 mL  3 mL Nebulization Q6H Caccavale, Sophia, PA-C   3 mL at 08/08/18 1307  . ipratropium-albuterol (DUONEB) 0.5-2.5 (3) MG/3ML nebulizer solution 3 mL  3 mL Inhalation Q6H PRN Etta Quill, DO      . lidocaine-EPINEPHrine (XYLOCAINE W/EPI) 2 %-1:200000 (PF) injection           . midazolam (VERSED) 2 MG/2ML injection           . montelukast (SINGULAIR) tablet 10 mg  10 mg Oral QHS Jennette Kettle M, DO   10 mg at 08/07/18 2114  . ondansetron (ZOFRAN) tablet 4 mg  4 mg Oral Q6H PRN Etta Quill, DO       Or  . ondansetron [facility]) injection 4 mg  4 mg Intravenous Q6H PRN Etta Quill, DO      . oxyCODONE-acetaminophen (PERCOCET/ROXICET) 5-325 MG per tablet 1 tablet  1 tablet Oral Q8H PRN Minda Ditto, RPH   1 tablet at 08/08/18 0933  . pantoprazole (PROTONIX) EC tablet 40 mg  40 mg Oral Daily Jennette Kettle M, DO   40 mg at 08/08/18 0920     REVIEW OF SYSTEMS: On review of systems, the patient reports that he is doing well overall. He continues with fatigue/malaise, cough, hemoptysis and intermittent substernal chest pain. He denies any fevers, chills, or night sweats.  He has noted a 30 lb unintended weight loss over the past 6 months+. He denies any bowel or bladder disturbances, and denies abdominal pain, nausea or vomiting. He denies any new musculoskeletal or joint aches or pains. A complete review of systems is obtained and is otherwise negative.   PHYSICAL EXAM:  Wt  Readings from Last 3 Encounters:  08/07/18 133 lb 14.4 oz (60.7 kg)  08/06/18 132 lb 1.9 oz (59.9 kg)  12/14/15 148 lb (67.1 kg)   Temp Readings from Last 3 Encounters:  08/08/18 98.2 F (36.8 C) (Oral)  08/06/18 97.8 F (36.6 C) (Oral)  12/14/15 97.9 F (36.6 C) (Oral)   BP Readings from Last 3 Encounters:  08/08/18 (!) 150/83  08/06/18 105/64  12/14/15 133/68   Pulse Readings from Last 3 Encounters:  08/08/18 98  08/06/18 (!) 116  12/14/15 91  Pain Assessment Pain Score: 0-No pain/10  In general this is a well appearing African-American male in no acute distress.  He is alert and oriented x4 and appropriate throughout the examination. HEENT reveals that the patient is normocephalic, atraumatic. EOMs are intact. PERRLA. Skin is intact without any evidence of gross lesions. Cardiovascular exam reveals a regular rate and rhythm, no clicks rubs or murmurs are auscultated. Chest is clear to auscultation bilaterally. Lymphatic assessment is performed and does not reveal any adenopathy in the cervical, supraclavicular, axillary, or inguinal chains. Abdomen has active bowel sounds in all quadrants and is intact. The abdomen is soft, non tender, non distended. Lower extremities are negative for pretibial pitting edema, deep calf tenderness, cyanosis or clubbing.   ECOG = 2  0 - Asymptomatic (Fully active, able to carry on all predisease activities without restriction)  1 - Symptomatic but completely ambulatory (Restricted in physically strenuous activity but ambulatory and able to carry out work of a light or sedentary nature. For example, light housework, office work)  2 - Symptomatic, <50% in bed during the day (Ambulatory and capable of all self care but unable to carry out any work activities. Up and about more than 50% of waking hours)  3 - Symptomatic, >50% in bed, but not bedbound (Capable of only limited self-care, confined to bed or chair 50% or more of waking hours)  4 -  Bedbound (Completely disabled. Cannot carry on any self-care. Totally confined to bed or chair)  5 - Death   Eustace Pen MM, Creech RH, Tormey DC, et al. [phone]). "Toxicity and response criteria of the [facility]". [place] Oncol. 5 (6): 649-55    LABORATORY DATA:  Lab Results  Component Value Date   WBC 9.4 08/08/2018   HGB 12.4 (L) 08/08/2018   HCT 37.2 (L) 08/08/2018   MCV 95.4 08/08/2018   PLT 223 08/08/2018   Lab Results  Component Value Date   NA 136 08/08/2018   K 4.2 08/08/2018   CL 104 08/08/2018   CO2 25 08/08/2018   Lab Results  Component Value Date   ALT 86 (H) 08/08/2018   AST 101 (H) 08/08/2018   ALKPHOS 82 08/08/2018   BILITOT 0.3 08/08/2018      RADIOGRAPHY: Ct Chest W Contrast  Result Date: 08/06/2018 CLINICAL DATA:  Hemoptysis for the past 2 weeks. Shortness of breath on exertion. Chest pain. Low back pain. Smoker. Large subcarinal mass, right hilar adenopathy, bilateral pleural plaques and possible left renal mass on a recent chest CT at [facility], dated 07/23/2018. EXAM: CT CHEST, ABDOMEN, AND PELVIS WITH CONTRAST TECHNIQUE: Multidetector CT imaging of the chest, abdomen and pelvis was performed following the standard protocol during bolus administration of intravenous contrast. CONTRAST:  110mL ISOVUE-300 IOPAMIDOL (ISOVUE-300) INJECTION 61% COMPARISON:  Chest radiographs obtained earlier today. Chest CT report dated 07/23/2018 from [facility]. FINDINGS: CT CHEST FINDINGS Cardiovascular: Atheromatous calcifications, including the coronary arteries and aorta. Mediastinum/Nodes: Heterogeneous subcarinal mass invading and occluding the right lower lobe bronchus and bronchus intermedius. This mass measures 5.8 x 3.2 cm on image number 33 series 2. There is irregular soft tissue density extending in the peribronchial regions into the more peripheral aspects of the right lower lobe. Similar-appearing enlarged right hilar lymph nodes. The  largest has a short axis diameter of 16 mm on image number 38 of series 2, containing central low density. Mildly enlarged precarinal node with a short axis diameter of 11 mm on image number  23 series 2. Unremarkable included thyroid gland. Lungs/Pleura: Bilateral calcified pleural plaques. Extensive bilateral bullous changes. Mild right lower lobe atelectasis. Musculoskeletal: Mild thoracic spine degenerative changes. No evidence of bony metastatic disease. CT ABDOMEN PELVIS FINDINGS Hepatobiliary: Mild diffuse low density of the liver relative to the spleen. Normal appearing gallbladder. Pancreas: Unremarkable. No pancreatic ductal dilatation or surrounding inflammatory changes. Spleen: Normal in size without focal abnormality. Adrenals/Urinary Tract: Small upper pole left renal cyst. Normal appearing adrenal glands, right kidney, ureters and urinary bladder. Stomach/Bowel: Stomach is within normal limits. Appendix appears normal. No evidence of bowel wall thickening, distention, or inflammatory changes. Vascular/Lymphatic: Atheromatous arterial calcifications without aneurysm. No enlarged lymph nodes. Reproductive: Moderately enlarged prostate gland. Other: Small right inguinal hernia containing fat and portion of a loop of small bowel without obstruction. Small left inguinal hernia containing fat. Musculoskeletal: Mild left and minimal right hip degenerative changes. Mild levoconvex lumbar scoliosis. Lumbar spine degenerative changes. No evidence of bony metastatic disease. IMPRESSION: 1. 5.8 x 3.2 cm subcarinal and medial right lower lobe lung mass invading and occluding the right lower lobe bronchus and bronchus intermedius. This is most compatible with a primary lung neoplasm. 2. Metastatic right hilar and precarinal adenopathy. 3. Probable tumor extending along the bronchi into the more peripheral portions of the right lower lobe. 4. Mild right lower lobe atelectasis. 5. Centrilobular and paraseptal  emphysema. 6. Bilateral calcified pleural plaques, compatible with previous asbestos exposure. 7.  Calcific coronary artery and aortic atherosclerosis. 8. Mild diffuse hepatic steatosis. 9. Moderate prostatic hypertrophy. 10. Small right inguinal hernia containing a portion of a small bowel loop without obstruction Electronically Signed   By: Claudie Revering M.D.   On: 08/06/2018 19:11   Mr Jeri Cos RX Contrast  Result Date: 08/07/2018 CLINICAL DATA:  Lung cancer. EXAM: MRI HEAD WITHOUT AND WITH CONTRAST TECHNIQUE: Multiplanar, multiecho pulse sequences of the brain and surrounding structures were obtained without and with intravenous contrast. CONTRAST:  56mL MULTIHANCE GADOBENATE DIMEGLUMINE 529 MG/ML IV SOLN COMPARISON:  None. FINDINGS: Brain: Scattered periventricular and subcortical white matter changes are moderately advanced for age. There is moderate atrophy. No acute infarct, hemorrhage, or mass lesion is present. No pathologic enhancement is present to suggest metastatic disease to the brain or meninges. The internal auditory canals are within normal limits bilaterally. The brainstem and cerebellum are normal. Vascular: Flow is present in the major intracranial arteries. Skull and upper cervical spine: Skull base is within normal limits. Craniocervical junction is normal. Sinuses/Orbits: The paranasal sinuses are clear. There is some fluid in the right mastoid air cells. No obstructing nasopharyngeal lesion is evident. Globes and orbits are within normal limits bilaterally. IMPRESSION: 1. No evidence for metastatic disease to the brain. 2. Atrophy and white matter changes are moderately advanced for age. This is nonspecific, but likely reflects the sequela of chronic microvascular ischemia. 3. Small right mastoid effusion. No obstructing nasopharyngeal lesion is present. Electronically Signed   By: San Morelle M.D.   On: 08/07/2018 08:21   Nm Bone Scan Whole Body  Result Date:  08/08/2018 CLINICAL DATA:  Recent diagnosis of lung counter [place]. Evaluate for metastatic disease. History of back surgery EXAM: NUCLEAR MEDICINE WHOLE BODY BONE SCAN TECHNIQUE: Whole body anterior and posterior images were obtained approximately 3 hours after intravenous injection of radiopharmaceutical. RADIOPHARMACEUTICALS:  21.5 mCi Technetium-61m MDP IV COMPARISON:  CT chest, abdomen and pelvis, 08/06/2018. FINDINGS: There are no areas of abnormal radiotracer localization to suggest metastatic disease to bone. There are  areas of uptake involving the sternoclavicular joints, shoulders, thoracolumbar spine, knees and ankles and feet as well as elbows and wrists, all of which appears degenerative in origin. Renal uptake is symmetric. IMPRESSION: No evidence of metastatic disease to bone. Electronically Signed   By: Lajean Manes M.D.   On: 08/08/2018 13:39   Ct Abdomen Pelvis W Contrast  Result Date: 08/06/2018 CLINICAL DATA:  Hemoptysis for the past 2 weeks. Shortness of breath on exertion. Chest pain. Low back pain. Smoker. Large subcarinal mass, right hilar adenopathy, bilateral pleural plaques and possible left renal mass on a recent chest CT at [facility], dated 07/23/2018. EXAM: CT CHEST, ABDOMEN, AND PELVIS WITH CONTRAST TECHNIQUE: Multidetector CT imaging of the chest, abdomen and pelvis was performed following the standard protocol during bolus administration of intravenous contrast. CONTRAST:  177mL ISOVUE-300 IOPAMIDOL (ISOVUE-300) INJECTION 61% COMPARISON:  Chest radiographs obtained earlier today. Chest CT report dated 07/23/2018 from [facility]. FINDINGS: CT CHEST FINDINGS Cardiovascular: Atheromatous calcifications, including the coronary arteries and aorta. Mediastinum/Nodes: Heterogeneous subcarinal mass invading and occluding the right lower lobe bronchus and bronchus intermedius. This mass measures 5.8 x 3.2 cm on image number 33 series 2. There is irregular soft tissue density  extending in the peribronchial regions into the more peripheral aspects of the right lower lobe. Similar-appearing enlarged right hilar lymph nodes. The largest has a short axis diameter of 16 mm on image number 38 of series 2, containing central low density. Mildly enlarged precarinal node with a short axis diameter of 11 mm on image number 23 series 2. Unremarkable included thyroid gland. Lungs/Pleura: Bilateral calcified pleural plaques. Extensive bilateral bullous changes. Mild right lower lobe atelectasis. Musculoskeletal: Mild thoracic spine degenerative changes. No evidence of bony metastatic disease. CT ABDOMEN PELVIS FINDINGS Hepatobiliary: Mild diffuse low density of the liver relative to the spleen. Normal appearing gallbladder. Pancreas: Unremarkable. No pancreatic ductal dilatation or surrounding inflammatory changes. Spleen: Normal in size without focal abnormality. Adrenals/Urinary Tract: Small upper pole left renal cyst. Normal appearing adrenal glands, right kidney, ureters and urinary bladder. Stomach/Bowel: Stomach is within normal limits. Appendix appears normal. No evidence of bowel wall thickening, distention, or inflammatory changes. Vascular/Lymphatic: Atheromatous arterial calcifications without aneurysm. No enlarged lymph nodes. Reproductive: Moderately enlarged prostate gland. Other: Small right inguinal hernia containing fat and portion of a loop of small bowel without obstruction. Small left inguinal hernia containing fat. Musculoskeletal: Mild left and minimal right hip degenerative changes. Mild levoconvex lumbar scoliosis. Lumbar spine degenerative changes. No evidence of bony metastatic disease. IMPRESSION: 1. 5.8 x 3.2 cm subcarinal and medial right lower lobe lung mass invading and occluding the right lower lobe bronchus and bronchus intermedius. This is most compatible with a primary lung neoplasm. 2. Metastatic right hilar and precarinal adenopathy. 3. Probable tumor extending  along the bronchi into the more peripheral portions of the right lower lobe. 4. Mild right lower lobe atelectasis. 5. Centrilobular and paraseptal emphysema. 6. Bilateral calcified pleural plaques, compatible with previous asbestos exposure. 7.  Calcific coronary artery and aortic atherosclerosis. 8. Mild diffuse hepatic steatosis. 9. Moderate prostatic hypertrophy. 10. Small right inguinal hernia containing a portion of a small bowel loop without obstruction Electronically Signed   By: Claudie Revering M.D.   On: 08/06/2018 19:11       IMPRESSION/PLAN: 1. Hemoptysis secondary to new 5.8 cm subcarinal mass invading and occluding the RLL bronchus and bronchus intermedius, suspected primary bronchogenic carcinoma, pathology pending. Dr. Lisbeth Renshaw and I have personally reviewed his imaging  and workup to date.  His pathology remains pending at this time but we highly suspect this is a primary bronchogenic carcinoma.  He will need a PET scan at discharge to complete his disease staging but based on his current imaging, he appears to have locally advanced disease, confined to the chest, likely Stage IIIa.  Today, I talked to the patient about the findings and workup thus far. We discussed the natural history of bronchogenic carcinoma and general treatment, highlighting the role of radiotherapy in the management. We discussed the available radiation techniques, and focused on the details of logistics and delivery. He appears to be a good candidate for concurrent chemoradiotherapy with curative intent but final treatment recommendations will be based on final pathology and PET imaging. We will tentatively plan for a 6.5 week course of radiotherapy to the RLL mass which should help control hemoptysis as well as relieve obstruction.  We reviewed the anticipated acute and late sequelae associated with radiation in this setting. The patient was encouraged to ask questions that were answered to his satisfaction.  Given his current  Hgb stability and clinical stability, we have scheduled him for CT SIM/radiation planning on Monday 08/11/18 at 9am for port and treat in anticipation of beginning radiotherapy Monday afternoon.  Should there be any significant change in his overall status or Hgb, we would be happy to intervene more urgently if necessary but will plan for Monday at this point. He will need a social work consult prior to discharge home to assist with arranging transportation to and from the cancer center for daily radiation treatments. He is also interested in assistance with nutritional supplements such as Boost/Ensure- nutrition consult would be helpful especially in light of anticipated radiation side effects.    Nicholos Johns, PA-C

## 2018-08-08 NOTE — Progress Notes (Addendum)
PROGRESS NOTE  Ryan Houston GEX:[id number] DOB: [birth year] DOA: 08/06/2018 PCP: Rogers Blocker, MD  HPI/Recap of past 24 hours: Ryan Houston is a 72 y.o. male with medical history significant of HCV, never took treatment for this.  Patient has 1 month h/o hemoptysis, recent weight loss.  Recently diagnosed with lung mass at a local ED.  08/07/18: Seen and examined at his bedside.  Reports dyspnea with minimal exertion.  Right lower lobe mass invading the right lower bronchus and bronchus intermedius.  MRI brain unremarkable for any acute intracranial findings.  Pulmonology and oncology following.  Highly appreciated  08/08/2018: Seen and examined at his bedside.  Continues to have hemoptysis.  Oncology and PCCM following.  Consulted radiation oncology Dr. Lisbeth Renshaw who will see the patient.  Assessment/Plan: Principal Problem:   Lung mass Active Problems:   Chronic hepatitis C without hepatic coma (HCC)   Cough with hemoptysis   Hypercalcemia of malignancy  Persistent hemoptysis secondary to right lower lobe mass invading right lower bronchus and bronchus intermedius Status post bronchoscopy with biopsies obtained POD #1 Pathology results pending Continue to monitor H&H  Dr. Lisbeth Renshaw radiation oncology has been consulted and will see the patient.  Highly appreciated. Oncology and pulmonology following.  Highly appreciated.  Suspected COPD with bullous emphysema PCCM following Possible PFTs outpatient  Chronic tobacco use disorder Continue nicotine patch Tobacco cessation counseling done at bedside  Hypomagnesemia Magnesium 1.3 Repleted with IV magnesium 4 g once  Moderate protein calorie malnutrition Albumin 2.4 Encourage increasing protein calorie intake  History of hepatitis C Per records it appears that the patient was treated at some point in 2017 through the infectious disease hepatitis C clinic and may not have followed up Follow-up with ID outpatient for treatment of  hepatitis C   Code Status: Full  Family Communication: None at bedside  Disposition Plan: Home in 1 to 2 days when radiation oncology signs off.   Consultants:  Oncology  PCCM  Radiation oncology  Procedures:  Bronchoscopy on 08/07/2018  Antimicrobials:  None  DVT prophylaxis: Subcu Lovenox daily   Objective: Vitals:   08/07/18 1950 08/07/18 2047 08/08/18 0548 08/08/18 0833  BP:  113/65 103/74   Pulse:  93 93   Resp:  18 18   Temp:  97.8 F (36.6 C) 98.2 F (36.8 C)   TempSrc:   Oral   SpO2: 94% 94% 95% 93%  Weight:      Height:        Intake/Output Summary (Last 24 hours) at 08/08/2018 1438 Last data filed at 08/08/2018 0600 Gross per 24 hour  Intake 2650.81 ml  Output 500 ml  Net 2150.81 ml   Filed Weights   08/06/18 1646 08/07/18 0225  Weight: 59.9 kg 60.7 kg    Exam:  . General: 72 y.o. year-old male well-developed well-nourished in no acute distress.  Alert and oriented x3. . Cardiovascular: Regular rate and rhythm with no rubs or gallops.  No JVD or thyromegaly noted.   Marland Kitchen Respiratory: Mild rales at bases with no wheezes. Good inspiratory effort. . Abdomen: Soft nontender nondistended with normal bowel sounds x4 quadrants. . Musculoskeletal: No lower extremity edema. 2/4 pulses in all 4 extremities. . Skin: No ulcerative lesions noted or rashes . Psychiatry: Mood is appropriate for condition and setting   Data Reviewed: CBC: Recent Labs  Lab 08/06/18 1509 08/07/18 0442 08/08/18 0418  WBC 10.2* 9.7 9.4  NEUTROABS 6.9*  --   --   HGB  15.2 14.1 12.4*  HCT 44.3 41.2 37.2*  MCV 92.7 94.3 95.4  PLT 280 259 315   Basic Metabolic Panel: Recent Labs  Lab 08/06/18 1509 08/07/18 0442 08/08/18 0418  NA 129 135 136  K 4.2 4.7 4.2  CL 100 102 104  CO2 25 27 25   GLUCOSE 143* 128* 142*  BUN 18 21 18   CREATININE 1.40* 1.29* 1.17  CALCIUM 10.4* 9.3 8.6*  MG  --   --  1.3*   GFR: Estimated Creatinine Clearance: 49 mL/min (by C-G formula  based on SCr of 1.17 mg/dL). Liver Function Tests: Recent Labs  Lab 08/06/18 1509 08/08/18 0418  AST 165* 101*  ALT 122* 86*  ALKPHOS 78 82  BILITOT 0.7 0.3  PROT 10.8* 7.7  ALBUMIN 3.1* 2.4*   No results for input(s): LIPASE, AMYLASE in the last 168 hours. No results for input(s): AMMONIA in the last 168 hours. Coagulation Profile: Recent Labs  Lab 08/08/18 1006  INR 1.17   Cardiac Enzymes: No results for input(s): CKTOTAL, CKMB, CKMBINDEX, TROPONINI in the last 168 hours. BNP (last 3 results) No results for input(s): PROBNP in the last 8760 hours. HbA1C: No results for input(s): HGBA1C in the last 72 hours. CBG: No results for input(s): GLUCAP in the last 168 hours. Lipid Profile: No results for input(s): CHOL, HDL, LDLCALC, TRIG, CHOLHDL, LDLDIRECT in the last 72 hours. Thyroid Function Tests: No results for input(s): TSH, T4TOTAL, FREET4, T3FREE, THYROIDAB in the last 72 hours. Anemia Panel: No results for input(s): VITAMINB12, FOLATE, FERRITIN, TIBC, IRON, RETICCTPCT in the last 72 hours. Urine analysis: No results found for: COLORURINE, APPEARANCEUR, LABSPEC, PHURINE, GLUCOSEU, HGBUR, BILIRUBINUR, KETONESUR, PROTEINUR, UROBILINOGEN, NITRITE, LEUKOCYTESUR Sepsis Labs: @LABRCNTIP (procalcitonin:4,lacticidven:4)  )No results found for this or any previous visit (from the past 240 hour(s)).    Studies: Nm Bone Scan Whole Body  Result Date: 08/08/2018 CLINICAL DATA:  Recent diagnosis of lung counter [place]. Evaluate for metastatic disease. History of back surgery EXAM: NUCLEAR MEDICINE WHOLE BODY BONE SCAN TECHNIQUE: Whole body anterior and posterior images were obtained approximately 3 hours after intravenous injection of radiopharmaceutical. RADIOPHARMACEUTICALS:  21.5 mCi Technetium-39m MDP IV COMPARISON:  CT chest, abdomen and pelvis, 08/06/2018. FINDINGS: There are no areas of abnormal radiotracer localization to suggest metastatic disease to bone. There are areas of  uptake involving the sternoclavicular joints, shoulders, thoracolumbar spine, knees and ankles and feet as well as elbows and wrists, all of which appears degenerative in origin. Renal uptake is symmetric. IMPRESSION: No evidence of metastatic disease to bone. Electronically Signed   By: Lajean Manes M.D.   On: 08/08/2018 13:39    Scheduled Meds: . azelastine  1 spray Each Nare BID  . feeding supplement (ENSURE ENLIVE)  237 mL Oral TID BM  . gabapentin  300 mg Oral TID  . ipratropium-albuterol  3 mL Nebulization Q6H  . montelukast  10 mg Oral QHS  . pantoprazole  40 mg Oral Daily    Continuous Infusions: . sodium chloride 75 mL/hr at 08/08/18 0358  . sodium chloride 10 mL/hr at 08/07/18 1305     LOS: 2 days     Kayleen Memos, MD [facility] Pager [phone]  If 7PM-7AM, please contact night-coverage [email or web address] Password [facility] 08/08/2018, 2:38 PM

## 2018-08-08 NOTE — Progress Notes (Signed)
Referring Physician(s): Ennever,P  Supervising Physician: Arne Cleveland  Patient Status:  [facility] - In-pt  Chief Complaint: Right lung mass, probable carcinoma   Subjective: Patient familiar to IR service from prior random core liver biopsy in 2004.  He has a history of hepatitis C, tobacco abuse, GERD, hypertension.  He recently presented to [facility] with hemoptysis, weight loss, subsequent imaging revealed right lung mass.  Patient underwent bronchoscopy yesterday but pathology pending.  Request received from oncology for Port-A-Cath placement for additional treatment.  Patient currently denies fever, abdominal pain, nausea, vomiting.  He does have intermittent headaches, cough, dyspnea, right lateral chest discomfort as well as intermittent back pain.  Past Medical History:  Diagnosis Date  . Arthritis   . Cough with hemoptysis 08/06/2018  . GERD (gastroesophageal reflux disease)   . Hepatitis C   . Hypercalcemia of malignancy 08/06/2018  . Hypertension   . Mediastinal mass 08/06/2018   Past Surgical History:  Procedure Laterality Date  . BACK SURGERY    . VIDEO BRONCHOSCOPY Bilateral 08/07/2018   Procedure: VIDEO BRONCHOSCOPY WITH FLUORO;  Surgeon: Chesley Mires, MD;  Location: [facility];  Service: Endoscopy;  Laterality: Bilateral;      Allergies: Patient has no known allergies.  Medications: Prior to Admission medications   Medication Sig Start Date End Date Taking? Authorizing Provider  albuterol (PROVENTIL HFA;VENTOLIN HFA) 108 (90 BASE) MCG/ACT inhaler Inhale 1-2 puffs into the lungs every 6 (six) hours as needed for wheezing. 07/05/12 08/07/18 Yes Lacretia Leigh, MD  Ascorbic Acid (VITAMIN C) 1000 MG tablet Take 1,000 mg by mouth every other day.   Yes [provider]  azelastine (ASTELIN) 0.1 % nasal spray Place into both nostrils 2 (two) times daily. Use in each nostril as directed   Yes [provider]  Cholecalciferol (VITAMIN D-3) 1000  UNITS CAPS Take 1,000 Units by mouth daily.    Yes [provider]  gabapentin (NEURONTIN) 300 MG capsule Take 300 mg by mouth 3 (three) times daily.   Yes [provider]  hydrOXYzine (ATARAX/VISTARIL) 25 MG tablet Take 1 tablet (25 mg total) by mouth every 6 (six) hours. 12/06/12  Yes Montine Circle, PA-C  ibuprofen (ADVIL,MOTRIN) 800 MG tablet Take 800 mg by mouth 3 (three) times daily with meals. Pain   Yes [provider]  montelukast (SINGULAIR) 10 MG tablet Take 10 mg by mouth at bedtime.   Yes [provider]  Omega-3 Fatty Acids (FISH OIL PO) Take 1 capsule by mouth every other day.   Yes [provider]  omeprazole (PRILOSEC) 20 MG capsule Take 1 capsule (20 mg total) by mouth daily. 12/06/12  Yes Montine Circle, PA-C  vitamin B-12 (CYANOCOBALAMIN) 1000 MCG tablet Take 1,000 mcg by mouth every other day.   Yes [provider]  VITAMIN E PO Take 2 capsules by mouth every other day.   Yes [provider]  oxyCODONE-acetaminophen (PERCOCET) 5-325 MG per tablet Take 1 tablet by mouth 2 (two) times daily. Pain    [provider]  Sofosbuvir-Velpatasvir (EPCLUSA) 400-100 MG TABS Take 1 tablet by mouth daily. Patient not taking: Reported on 08/07/2018 03/26/16   Thayer Headings, MD     Vital Signs: BP 103/74 (BP Location: Right Arm)   Pulse 93   Temp 98.2 F (36.8 C) (Oral)   Resp 18   Ht 5' 9.5" (1.765 m)   Wt 133 lb 14.4 oz (60.7 kg)   SpO2 93%   BMI  19.49 kg/m   Physical Exam awake, alert.  Chest with distant breath sounds bilaterally.  Heart with regular rate and rhythm.  Abdomen soft, positive bowel sounds, nontender.  No lower extremity edema  Imaging: Ct Chest W Contrast  Result Date: 08/06/2018 CLINICAL DATA:  Hemoptysis for the past 2 weeks. Shortness of breath on exertion. Chest pain. Low back pain. Smoker. Large subcarinal mass, right hilar adenopathy, bilateral pleural plaques and possible left renal  mass on a recent chest CT at [facility], dated 07/23/2018. EXAM: CT CHEST, ABDOMEN, AND PELVIS WITH CONTRAST TECHNIQUE: Multidetector CT imaging of the chest, abdomen and pelvis was performed following the standard protocol during bolus administration of intravenous contrast. CONTRAST:  159mL ISOVUE-300 IOPAMIDOL (ISOVUE-300) INJECTION 61% COMPARISON:  Chest radiographs obtained earlier today. Chest CT report dated 07/23/2018 from [facility]. FINDINGS: CT CHEST FINDINGS Cardiovascular: Atheromatous calcifications, including the coronary arteries and aorta. Mediastinum/Nodes: Heterogeneous subcarinal mass invading and occluding the right lower lobe bronchus and bronchus intermedius. This mass measures 5.8 x 3.2 cm on image number 33 series 2. There is irregular soft tissue density extending in the peribronchial regions into the more peripheral aspects of the right lower lobe. Similar-appearing enlarged right hilar lymph nodes. The largest has a short axis diameter of 16 mm on image number 38 of series 2, containing central low density. Mildly enlarged precarinal node with a short axis diameter of 11 mm on image number 23 series 2. Unremarkable included thyroid gland. Lungs/Pleura: Bilateral calcified pleural plaques. Extensive bilateral bullous changes. Mild right lower lobe atelectasis. Musculoskeletal: Mild thoracic spine degenerative changes. No evidence of bony metastatic disease. CT ABDOMEN PELVIS FINDINGS Hepatobiliary: Mild diffuse low density of the liver relative to the spleen. Normal appearing gallbladder. Pancreas: Unremarkable. No pancreatic ductal dilatation or surrounding inflammatory changes. Spleen: Normal in size without focal abnormality. Adrenals/Urinary Tract: Small upper pole left renal cyst. Normal appearing adrenal glands, right kidney, ureters and urinary bladder. Stomach/Bowel: Stomach is within normal limits. Appendix appears normal. No evidence of bowel wall thickening, distention, or  inflammatory changes. Vascular/Lymphatic: Atheromatous arterial calcifications without aneurysm. No enlarged lymph nodes. Reproductive: Moderately enlarged prostate gland. Other: Small right inguinal hernia containing fat and portion of a loop of small bowel without obstruction. Small left inguinal hernia containing fat. Musculoskeletal: Mild left and minimal right hip degenerative changes. Mild levoconvex lumbar scoliosis. Lumbar spine degenerative changes. No evidence of bony metastatic disease. IMPRESSION: 1. 5.8 x 3.2 cm subcarinal and medial right lower lobe lung mass invading and occluding the right lower lobe bronchus and bronchus intermedius. This is most compatible with a primary lung neoplasm. 2. Metastatic right hilar and precarinal adenopathy. 3. Probable tumor extending along the bronchi into the more peripheral portions of the right lower lobe. 4. Mild right lower lobe atelectasis. 5. Centrilobular and paraseptal emphysema. 6. Bilateral calcified pleural plaques, compatible with previous asbestos exposure. 7.  Calcific coronary artery and aortic atherosclerosis. 8. Mild diffuse hepatic steatosis. 9. Moderate prostatic hypertrophy. 10. Small right inguinal hernia containing a portion of a small bowel loop without obstruction Electronically Signed   By: Claudie Revering M.D.   On: 08/06/2018 19:11   Mr Jeri Cos XN Contrast  Result Date: 08/07/2018 CLINICAL DATA:  Lung cancer. EXAM: MRI HEAD WITHOUT AND WITH CONTRAST TECHNIQUE: Multiplanar, multiecho pulse sequences of the brain and surrounding structures were obtained without and with intravenous contrast. CONTRAST:  2mL MULTIHANCE GADOBENATE DIMEGLUMINE 529 MG/ML IV SOLN COMPARISON:  None. FINDINGS: Brain: Scattered periventricular and subcortical white  matter changes are moderately advanced for age. There is moderate atrophy. No acute infarct, hemorrhage, or mass lesion is present. No pathologic enhancement is present to suggest metastatic disease to  the brain or meninges. The internal auditory canals are within normal limits bilaterally. The brainstem and cerebellum are normal. Vascular: Flow is present in the major intracranial arteries. Skull and upper cervical spine: Skull base is within normal limits. Craniocervical junction is normal. Sinuses/Orbits: The paranasal sinuses are clear. There is some fluid in the right mastoid air cells. No obstructing nasopharyngeal lesion is evident. Globes and orbits are within normal limits bilaterally. IMPRESSION: 1. No evidence for metastatic disease to the brain. 2. Atrophy and white matter changes are moderately advanced for age. This is nonspecific, but likely reflects the sequela of chronic microvascular ischemia. 3. Small right mastoid effusion. No obstructing nasopharyngeal lesion is present. Electronically Signed   By: San Morelle M.D.   On: 08/07/2018 08:21   Ct Abdomen Pelvis W Contrast  Result Date: 08/06/2018 CLINICAL DATA:  Hemoptysis for the past 2 weeks. Shortness of breath on exertion. Chest pain. Low back pain. Smoker. Large subcarinal mass, right hilar adenopathy, bilateral pleural plaques and possible left renal mass on a recent chest CT at [facility], dated 07/23/2018. EXAM: CT CHEST, ABDOMEN, AND PELVIS WITH CONTRAST TECHNIQUE: Multidetector CT imaging of the chest, abdomen and pelvis was performed following the standard protocol during bolus administration of intravenous contrast. CONTRAST:  145mL ISOVUE-300 IOPAMIDOL (ISOVUE-300) INJECTION 61% COMPARISON:  Chest radiographs obtained earlier today. Chest CT report dated 07/23/2018 from [facility]. FINDINGS: CT CHEST FINDINGS Cardiovascular: Atheromatous calcifications, including the coronary arteries and aorta. Mediastinum/Nodes: Heterogeneous subcarinal mass invading and occluding the right lower lobe bronchus and bronchus intermedius. This mass measures 5.8 x 3.2 cm on image number 33 series 2. There is irregular soft tissue  density extending in the peribronchial regions into the more peripheral aspects of the right lower lobe. Similar-appearing enlarged right hilar lymph nodes. The largest has a short axis diameter of 16 mm on image number 38 of series 2, containing central low density. Mildly enlarged precarinal node with a short axis diameter of 11 mm on image number 23 series 2. Unremarkable included thyroid gland. Lungs/Pleura: Bilateral calcified pleural plaques. Extensive bilateral bullous changes. Mild right lower lobe atelectasis. Musculoskeletal: Mild thoracic spine degenerative changes. No evidence of bony metastatic disease. CT ABDOMEN PELVIS FINDINGS Hepatobiliary: Mild diffuse low density of the liver relative to the spleen. Normal appearing gallbladder. Pancreas: Unremarkable. No pancreatic ductal dilatation or surrounding inflammatory changes. Spleen: Normal in size without focal abnormality. Adrenals/Urinary Tract: Small upper pole left renal cyst. Normal appearing adrenal glands, right kidney, ureters and urinary bladder. Stomach/Bowel: Stomach is within normal limits. Appendix appears normal. No evidence of bowel wall thickening, distention, or inflammatory changes. Vascular/Lymphatic: Atheromatous arterial calcifications without aneurysm. No enlarged lymph nodes. Reproductive: Moderately enlarged prostate gland. Other: Small right inguinal hernia containing fat and portion of a loop of small bowel without obstruction. Small left inguinal hernia containing fat. Musculoskeletal: Mild left and minimal right hip degenerative changes. Mild levoconvex lumbar scoliosis. Lumbar spine degenerative changes. No evidence of bony metastatic disease. IMPRESSION: 1. 5.8 x 3.2 cm subcarinal and medial right lower lobe lung mass invading and occluding the right lower lobe bronchus and bronchus intermedius. This is most compatible with a primary lung neoplasm. 2. Metastatic right hilar and precarinal adenopathy. 3. Probable tumor  extending along the bronchi into the more peripheral portions of the right lower  lobe. 4. Mild right lower lobe atelectasis. 5. Centrilobular and paraseptal emphysema. 6. Bilateral calcified pleural plaques, compatible with previous asbestos exposure. 7.  Calcific coronary artery and aortic atherosclerosis. 8. Mild diffuse hepatic steatosis. 9. Moderate prostatic hypertrophy. 10. Small right inguinal hernia containing a portion of a small bowel loop without obstruction Electronically Signed   By: Claudie Revering M.D.   On: 08/06/2018 19:11    Labs:  CBC: Recent Labs    08/06/18 1509 08/07/18 0442 08/08/18 0418  WBC 10.2* 9.7 9.4  HGB 15.2 14.1 12.4*  HCT 44.3 41.2 37.2*  PLT 280 259 223    COAGS: Recent Labs    08/08/18 1006  INR 1.17    BMP: Recent Labs    08/06/18 1509 08/07/18 0442 08/08/18 0418  NA 129 135 136  K 4.2 4.7 4.2  CL 100 102 104  CO2 25 27 25   GLUCOSE 143* 128* 142*  BUN 18 21 18   CALCIUM 10.4* 9.3 8.6*  CREATININE 1.40* 1.29* 1.17  GFRNONAA  --  54* >60  GFRAA  --  >60 >60    LIVER FUNCTION TESTS: Recent Labs    08/06/18 1509 08/08/18 0418  BILITOT 0.7 0.3  AST 165* 101*  ALT 122* 86*  ALKPHOS 78 82  PROT 10.8* 7.7  ALBUMIN 3.1* 2.4*    Assessment and Plan: Pt with history of hepatitis C, tobacco abuse, GERD, hypertension.  He recently presented to [facility] with hemoptysis, weight loss, subsequent imaging revealed right lung mass.  Patient underwent bronchoscopy yesterday but pathology pending (carcinoma suspected).  Request received from oncology for Port-A-Cath placement for additional treatment.Risks and benefits of image guided port-a-catheter placement was discussed with the patient including, but not limited to bleeding, infection, pneumothorax, or fibrin sheath development and need for additional procedures.  All of the patient's questions were answered, patient is agreeable to proceed. Consent signed and in  chart.  Procedure tent planned for later today or on 8/12   Electronically Signed: D. Rowe Robert, PA-C 08/08/2018, 1:11 PM   I spent a total of 25 minutes at the the patient's bedside AND on the patient's hospital floor or unit, greater than 50% of which was counseling/coordinating care for Port-A-Cath placement    Patient ID: Ryan Houston, male   DOB: Oct 03, 1946, 72 y.o.   MRN: [id number]

## 2018-08-09 ENCOUNTER — Inpatient Hospital Stay (HOSPITAL_COMMUNITY): Payer: Medicare Other

## 2018-08-09 LAB — BASIC METABOLIC PANEL
Anion gap: 5 (ref 5–15)
BUN: 13 mg/dL (ref 8–23)
CHLORIDE: 101 mmol/L (ref 98–111)
CO2: 24 mmol/L (ref 22–32)
Calcium: 8.4 mg/dL — ABNORMAL LOW (ref 8.9–10.3)
Creatinine, Ser: 1.04 mg/dL (ref 0.61–1.24)
GFR calc Af Amer: >60 mL/min (ref >60)
GFR calc non Af Amer: >60 mL/min (ref >60)
GLUCOSE: 155 mg/dL — AB (ref 70–99)
Potassium: 3.7 mmol/L (ref 3.5–5.1)
Sodium: 130 mmol/L — ABNORMAL LOW (ref 135–145)

## 2018-08-09 LAB — CBC
HEMATOCRIT: 35.8 % — AB (ref 39.0–52.0)
Hemoglobin: 12.1 g/dL — ABNORMAL LOW (ref 13.0–17.0)
MCH: 31.7 pg (ref 26.0–34.0)
MCHC: 33.8 g/dL (ref 30.0–36.0)
MCV: 93.7 fL (ref 78.0–100.0)
Platelets: 202 10*3/uL (ref 150–400)
RBC: 3.82 MIL/uL — ABNORMAL LOW (ref 4.22–5.81)
RDW: 12.7 % (ref 11.5–15.5)
WBC: 14.6 10*3/uL — ABNORMAL HIGH (ref 4.0–10.5)

## 2018-08-09 LAB — URINALYSIS, ROUTINE W REFLEX MICROSCOPIC
Bilirubin Urine: NEGATIVE
Glucose, UA: NEGATIVE mg/dL
Hgb urine dipstick: NEGATIVE
Ketones, ur: NEGATIVE mg/dL
Leukocytes, UA: NEGATIVE
Nitrite: NEGATIVE
Protein, ur: NEGATIVE mg/dL
Specific Gravity, Urine: 1.01 (ref 1.005–1.030)
pH: 6 (ref 5.0–8.0)

## 2018-08-09 LAB — MAGNESIUM: Magnesium: 1.4 mg/dL — ABNORMAL LOW (ref 1.7–2.4)

## 2018-08-09 LAB — LACTIC ACID, PLASMA
LACTIC ACID, VENOUS: 1.8 mmol/L (ref 0.5–1.9)
LACTIC ACID, VENOUS: 1.9 mmol/L (ref 0.5–1.9)

## 2018-08-09 LAB — PROCALCITONIN: Procalcitonin: 0.34 ng/mL

## 2018-08-09 MED ORDER — SODIUM CHLORIDE 0.9% FLUSH
10.0000 mL | INTRAVENOUS | Status: DC | PRN
  Administered 2018-08-14: 10 mL
  Filled 2018-08-09: qty 40

## 2018-08-09 MED ORDER — SODIUM CHLORIDE 0.9 % IV BOLUS
500.0000 mL | Freq: Once | INTRAVENOUS | Status: AC
  Administered 2018-08-09: 500 mL via INTRAVENOUS

## 2018-08-09 MED ORDER — SENNOSIDES-DOCUSATE SODIUM 8.6-50 MG PO TABS
1.0000 | ORAL_TABLET | Freq: Two times a day (BID) | ORAL | Status: DC
  Administered 2018-08-09 – 2018-08-14 (×10): 1 via ORAL
  Filled 2018-08-09 (×10): qty 1

## 2018-08-09 MED ORDER — OXYCODONE HCL 5 MG PO TABS
10.0000 mg | ORAL_TABLET | ORAL | Status: DC | PRN
Start: 2018-08-09 — End: 2018-08-14
  Administered 2018-08-09 – 2018-08-14 (×13): 10 mg via ORAL
  Filled 2018-08-09 (×13): qty 2

## 2018-08-09 MED ORDER — OXYCODONE HCL ER 10 MG PO T12A
10.0000 mg | EXTENDED_RELEASE_TABLET | Freq: Two times a day (BID) | ORAL | Status: DC
  Administered 2018-08-09 – 2018-08-14 (×11): 10 mg via ORAL
  Filled 2018-08-09 (×11): qty 1

## 2018-08-09 MED ORDER — POLYETHYLENE GLYCOL 3350 17 G PO PACK
17.0000 g | PACK | Freq: Every day | ORAL | Status: DC
  Administered 2018-08-09 – 2018-08-14 (×4): 17 g via ORAL
  Filled 2018-08-09 (×5): qty 1

## 2018-08-09 NOTE — Progress Notes (Signed)
PROGRESS NOTE  Ryan Houston IWL:[id number] DOB: 1946-01-29 DOA: 08/06/2018 PCP: Rogers Blocker, MD  HPI/Recap of past 24 hours: Ryan Houston is a 72 y.o. male with medical history significant of HCV, never took treatment for this.  Patient has 1 month h/o hemoptysis, recent weight loss.  Recently diagnosed with lung mass at a local ED.  08/07/18: Seen and examined at his bedside.  Reports dyspnea with minimal exertion.  Right lower lobe mass invading the right lower bronchus and bronchus intermedius.  MRI brain unremarkable for any acute intracranial findings.  Pulmonology and oncology following.  Highly appreciated  08/08/2018: Seen and examined at his bedside.  Continues to have hemoptysis.  Oncology and PCCM following.  Consulted radiation oncology Dr. Lisbeth Renshaw who will see the patient.  08/09/2018: Patient seen and examined with his brother and sister-in-law at bedside.  Febrile overnight with T-max of 102, work-up unrevealing.  Blood cultures x2 in process.  Has a headache that is intermittent.  MRI of brain done during this admission negative for any acute intracranial findings.  Assessment/Plan: Principal Problem:   Lung mass Active Problems:   Chronic hepatitis C without hepatic coma (HCC)   Cough with hemoptysis   Hypercalcemia of malignancy   Malnutrition of moderate degree  Persistent hemoptysis secondary to right lower lobe mass invading right lower bronchus and bronchus intermedius Status post bronchoscopy with biopsies obtained POD #2 Pathology results pending Continue to monitor H&H  Dr. Lisbeth Renshaw radiation oncology has been consulted and following.   Oncology and pulmonology following. Port-A-Cath placed in on 08/08/2018 Possible radiation treatment on Monday, 08/11/2018  Fever Suspect secondary to malignancy UA negative Chest x-ray independently reviewed did not show any lobular infiltrates that would be indicative of pneumonia Blood cultures x2 in  process  Leukocytosis Suspect reactive Will obtain procalcitonin to rule out bacterial infection Repeat CBC in the morning  Suspected COPD with bullous emphysema PCCM following Possible PFTs outpatient  Chronic tobacco use disorder Continue nicotine patch Tobacco cessation counseling done at bedside  Hypomagnesemia Magnesium 1.4 from 1.3 Repleted with IV magnesium 4 g  Euvolemic hyponatremia Suspect secondary to lung cancer Sodium 130 Repeat BMP in the morning  Headaches If persistent or migraine may start sumatriptan As needed Tylenol Avoid using more than 2 g due to history of hepatitis C  Moderate protein calorie malnutrition Albumin 2.4 Encourage increasing protein calorie intake  History of hepatitis C Per records it appears that the patient was treated at some point in 2017 through the infectious disease hepatitis C clinic and may not have followed up Follow-up with ID outpatient for treatment of hepatitis C   Code Status: Full  Family Communication: None at bedside  Disposition Plan: Home in 2 to 3 days when radiation oncology signs off.   Consultants:  Oncology  PCCM  Radiation oncology  Procedures:  Bronchoscopy on 08/07/2018  Antimicrobials:  None  DVT prophylaxis: Subcu Lovenox daily   Objective: Vitals:   08/09/18 0137 08/09/18 0536 08/09/18 0833 08/09/18 1247  BP:  (!) 95/58  129/69  Pulse:  85 90 90  Resp:  16 17 16   Temp: 99.6 F (37.6 C) 97.7 F (36.5 C)  98.9 F (37.2 C)  TempSrc: Oral Oral  Oral  SpO2:  95% 92% 97%  Weight:      Height:        Intake/Output Summary (Last 24 hours) at 08/09/2018 1415 Last data filed at 08/09/2018 1200 Gross per 24 hour  Intake 2856.66  ml  Output 750 ml  Net 2106.66 ml   Filed Weights   08/06/18 1646 08/07/18 0225  Weight: 59.9 kg 60.7 kg    Exam:  . General: 72 y.o. year-old male well-developed well-nourished in no acute distress.  Alert and oriented x3.   . Cardiovascular:  Regular rate and rhythm with no rubs or gallops.  No JVD or thyromegaly noted. Marland Kitchen Respiratory: Mild rales at bases with no wheezes. Good inspiratory effort. . Abdomen: Soft nontender nondistended with normal bowel sounds x4 quadrants. . Musculoskeletal: No lower extremity edema. 2/4 pulses in all 4 extremities. . Skin: No ulcerative lesions noted or rashes . Psychiatry: Mood is appropriate for condition and setting   Data Reviewed: CBC: Recent Labs  Lab 08/06/18 1509 08/07/18 0442 08/08/18 0418 08/09/18 0108  WBC 10.2* 9.7 9.4 14.6*  NEUTROABS 6.9*  --   --   --   HGB 15.2 14.1 12.4* 12.1*  HCT 44.3 41.2 37.2* 35.8*  MCV 92.7 94.3 95.4 93.7  PLT 280 259 223 193   Basic Metabolic Panel: Recent Labs  Lab 08/06/18 1509 08/07/18 0442 08/08/18 0418 08/09/18 0108  NA 129 135 136 130*  K 4.2 4.7 4.2 3.7  CL 100 102 104 101  CO2 25 27 25 24   GLUCOSE 143* 128* 142* 155*  BUN 18 21 18 13   CREATININE 1.40* 1.29* 1.17 1.04  CALCIUM 10.4* 9.3 8.6* 8.4*  MG  --   --  1.3* 1.4*   GFR: Estimated Creatinine Clearance: 55.1 mL/min (by C-G formula based on SCr of 1.04 mg/dL). Liver Function Tests: Recent Labs  Lab 08/06/18 1509 08/08/18 0418  AST 165* 101*  ALT 122* 86*  ALKPHOS 78 82  BILITOT 0.7 0.3  PROT 10.8* 7.7  ALBUMIN 3.1* 2.4*   No results for input(s): LIPASE, AMYLASE in the last 168 hours. No results for input(s): AMMONIA in the last 168 hours. Coagulation Profile: Recent Labs  Lab 08/08/18 1006  INR 1.17   Cardiac Enzymes: No results for input(s): CKTOTAL, CKMB, CKMBINDEX, TROPONINI in the last 168 hours. BNP (last 3 results) No results for input(s): PROBNP in the last 8760 hours. HbA1C: No results for input(s): HGBA1C in the last 72 hours. CBG: No results for input(s): GLUCAP in the last 168 hours. Lipid Profile: No results for input(s): CHOL, HDL, LDLCALC, TRIG, CHOLHDL, LDLDIRECT in the last 72 hours. Thyroid Function Tests: No results for input(s):  TSH, T4TOTAL, FREET4, T3FREE, THYROIDAB in the last 72 hours. Anemia Panel: No results for input(s): VITAMINB12, FOLATE, FERRITIN, TIBC, IRON, RETICCTPCT in the last 72 hours. Urine analysis:    Component Value Date/Time   COLORURINE STRAW (A) 08/09/2018 0029   APPEARANCEUR CLEAR 08/09/2018 0029   LABSPEC 1.010 08/09/2018 0029   PHURINE 6.0 08/09/2018 0029   GLUCOSEU NEGATIVE 08/09/2018 0029   HGBUR NEGATIVE 08/09/2018 0029   BILIRUBINUR NEGATIVE 08/09/2018 0029   KETONESUR NEGATIVE 08/09/2018 0029   PROTEINUR NEGATIVE 08/09/2018 0029   NITRITE NEGATIVE 08/09/2018 0029   LEUKOCYTESUR NEGATIVE 08/09/2018 0029   Sepsis Labs: @LABRCNTIP (procalcitonin:4,lacticidven:4)  ) Recent Results (from the past 240 hour(s))  Culture, blood (routine x 2)     Status: None (Preliminary result)   Collection Time: 08/09/18  1:06 AM  Result Value Ref Range Status   Specimen Description   Final    BLOOD RIGHT FOREARM Performed at [facility], [place] [address], [place], [place] [address]    Special Requests   Final    BOTTLES DRAWN  AEROBIC AND ANAEROBIC Blood Culture adequate volume Performed at [facility] [address] [place], [place] [address]    Culture PENDING  Incomplete   Report Status PENDING  Incomplete      Studies: Dg Chest 1 View  Result Date: 08/09/2018 CLINICAL DATA:  Fever and lung mass EXAM: CHEST  1 VIEW COMPARISON:  Chest CT 08/06/2018 FINDINGS: Redemonstration of mass at the medial right lung base, better characterized on recent CT. There are nodular opacities projecting over the upper left lung, likely corresponding to calcified pleural plaques. Accessed right chest wall Port-A-Cath tip is in the lower SVC. No new area consolidation. IMPRESSION: 1. Medial right lung base mass with right basilar atelectasis 2. Calcified pleural plaques Electronically Signed   By: Ulyses Jarred M.D.   On: 08/09/2018 01:08   Ir Imaging Guided Port Insertion  Result  Date: 08/08/2018 CLINICAL DATA:  Lung mass, needs durable venous access for planned chemotherapy regimen. EXAM: TUNNELED PORT CATHETER PLACEMENT WITH ULTRASOUND AND FLUOROSCOPIC GUIDANCE FLUOROSCOPY TIME:  Less than 0.1 minute; 5  uGym2 DAP ANESTHESIA/SEDATION: Intravenous Fentanyl and Versed were administered as conscious sedation during continuous monitoring of the patient's level of consciousness and physiological / cardiorespiratory status by the radiology RN, with a total moderate sedation time of 15 minutes. TECHNIQUE: The procedure, risks, benefits, and alternatives were explained to the patient. Questions regarding the procedure were encouraged and answered. The patient understands and consents to the procedure. As antibiotic prophylaxis, cefazolin 2 g was ordered pre-procedure and administered intravenously within one hour of incision. Patency of the right IJ vein was confirmed with ultrasound with image documentation. An appropriate skin site was determined. Skin site was marked. Region was prepped using maximum barrier technique including cap and mask, sterile gown, sterile gloves, large sterile sheet, and Chlorhexidine as cutaneous antisepsis. The region was infiltrated locally with 1% lidocaine. Under real-time ultrasound guidance, the right IJ vein was accessed with a 21 gauge micropuncture needle; the needle tip within the vein was confirmed with ultrasound image documentation. Needle was exchanged over a 018 guidewire for transitional dilator which allowed passage of the [facility] wire into the IVC. Over this, the transitional dilator was exchanged for a 5 [place] MPA catheter. A small incision was made on the right anterior chest wall and a subcutaneous pocket fashioned. The power-injectable port was positioned and its catheter tunneled to the right IJ dermatotomy site. The MPA catheter was exchanged over an Amplatz wire for a peel-away sheath, through which the port catheter, which had been trimmed to  the appropriate length, was advanced and positioned under fluoroscopy with its tip at the cavoatrial junction. Spot chest radiograph confirms good catheter position and no pneumothorax. The pocket was closed with deep interrupted and subcuticular continuous 3-0 Monocryl sutures. The port was flushed per protocol. The incisions were covered with Dermabond then covered with a sterile dressing. COMPLICATIONS: COMPLICATIONS None immediate IMPRESSION: Technically successful right IJ power-injectable port catheter placement. Ready for routine use. Electronically Signed   By: Lucrezia Europe M.D.   On: 08/08/2018 16:43    Scheduled Meds: . azelastine  1 spray Each Nare BID  . feeding supplement (ENSURE ENLIVE)  237 mL Oral TID BM  . gabapentin  300 mg Oral TID  . ipratropium-albuterol  3 mL Nebulization Q6H  . montelukast  10 mg Oral QHS  . nicotine  21 mg Transdermal Q24H  . oxyCODONE  10 mg Oral Q12H  . pantoprazole  40 mg Oral Daily  .  polyethylene glycol  17 g Oral Daily  . senna-docusate  1 tablet Oral BID    Continuous Infusions: . sodium chloride 75 mL/hr at 08/09/18 0344  . sodium chloride 10 mL/hr at 08/07/18 1305     LOS: 3 days     Kayleen Memos, MD [facility] Pager [phone]  If 7PM-7AM, please contact night-coverage [email or web address] Password [email or web address] 08/09/2018, 2:15 PM

## 2018-08-09 NOTE — Plan of Care (Signed)
Patient stable during7 a to 7 p shift, fever spike as per previous note, quickly came back down to 98.3 after Tylenol and Ibuprofen.  Patient feels new pain regimen with combination of long acting and short acting medication is controlling his pain better.  Tolerating regular diet and drinking Ensure, no nausea or vomiting reported.  Multiple family members in room this shift.

## 2018-08-09 NOTE — Progress Notes (Signed)
Charge nurse notified by night time NT that pt states he put $80 in sock overnight and woke up this morning and the money was missing. NT checked the linen bags and the room and was unable to find the money. AC was notified and safe zone submitted.

## 2018-08-09 NOTE — Progress Notes (Signed)
Dr. Hilbert Bible made aware of patient persistent fever > 102F with Tylenol, Percocet, and Ibuprofen given.   Stat UA negative, Stat CXR negative for any new consolidation, Blood cultures pending. Sat 500 cc bolus admin per MD order. Will continue to monitor.

## 2018-08-09 NOTE — Progress Notes (Signed)
Patient visibly shaking, states this is how he was yesterday when he spiked a fever.  Temperature noted to be 99.7 at this time, Ibuprofen given.  Will continue to monitor.

## 2018-08-10 LAB — CBC
HCT: 34.2 % — ABNORMAL LOW (ref 39.0–52.0)
Hemoglobin: 11.6 g/dL — ABNORMAL LOW (ref 13.0–17.0)
MCH: 32 pg (ref 26.0–34.0)
MCHC: 33.9 g/dL (ref 30.0–36.0)
MCV: 94.2 fL (ref 78.0–100.0)
PLATELETS: 191 10*3/uL (ref 150–400)
RBC: 3.63 MIL/uL — ABNORMAL LOW (ref 4.22–5.81)
RDW: 12.8 % (ref 11.5–15.5)
WBC: 17.1 10*3/uL — ABNORMAL HIGH (ref 4.0–10.5)

## 2018-08-10 LAB — COMPREHENSIVE METABOLIC PANEL
ALBUMIN: 2.2 g/dL — AB (ref 3.5–5.0)
ALT: 55 U/L — ABNORMAL HIGH (ref 0–44)
ANION GAP: 5 (ref 5–15)
AST: 55 U/L — ABNORMAL HIGH (ref 15–41)
Alkaline Phosphatase: 89 U/L (ref 38–126)
BUN: 15 mg/dL (ref 8–23)
CO2: 25 mmol/L (ref 22–32)
Calcium: 8.2 mg/dL — ABNORMAL LOW (ref 8.9–10.3)
Chloride: 106 mmol/L (ref 98–111)
Creatinine, Ser: 1.22 mg/dL (ref 0.61–1.24)
GFR calc non Af Amer: 57 mL/min — ABNORMAL LOW (ref >60)
GLUCOSE: 208 mg/dL — AB (ref 70–99)
POTASSIUM: 3.5 mmol/L (ref 3.5–5.1)
SODIUM: 136 mmol/L (ref 135–145)
Total Bilirubin: 0.4 mg/dL (ref 0.3–1.2)
Total Protein: 7.4 g/dL (ref 6.5–8.1)

## 2018-08-10 LAB — MRSA PCR SCREENING: MRSA by PCR: NEGATIVE

## 2018-08-10 MED ORDER — IPRATROPIUM-ALBUTEROL 0.5-2.5 (3) MG/3ML IN SOLN: 3.0000 mL | RESPIRATORY_TRACT | Status: DC | PRN

## 2018-08-10 MED ORDER — IPRATROPIUM-ALBUTEROL 0.5-2.5 (3) MG/3ML IN SOLN: 3.0000 mL | Freq: Four times a day (QID) | RESPIRATORY_TRACT | Status: DC

## 2018-08-10 MED ORDER — IPRATROPIUM-ALBUTEROL 0.5-2.5 (3) MG/3ML IN SOLN
3.0000 mL | Freq: Three times a day (TID) | RESPIRATORY_TRACT | Status: DC
  Administered 2018-08-10 – 2018-08-14 (×13): 3 mL via RESPIRATORY_TRACT
  Filled 2018-08-10 (×13): qty 3

## 2018-08-10 MED ORDER — VANCOMYCIN HCL IN DEXTROSE 1-5 GM/200ML-% IV SOLN
1000.0000 mg | Freq: Once | INTRAVENOUS | Status: AC
  Administered 2018-08-10: 1000 mg via INTRAVENOUS
  Filled 2018-08-10: qty 200

## 2018-08-10 MED ORDER — VANCOMYCIN HCL IN DEXTROSE 750-5 MG/150ML-% IV SOLN
750.0000 mg | INTRAVENOUS | Status: DC
  Filled 2018-08-10: qty 150

## 2018-08-10 MED ORDER — MIRABEGRON ER 25 MG PO TB24
25.0000 mg | ORAL_TABLET | Freq: Every day | ORAL | Status: DC
  Administered 2018-08-10 – 2018-08-14 (×5): 25 mg via ORAL
  Filled 2018-08-10 (×5): qty 1

## 2018-08-10 MED ORDER — PIPERACILLIN-TAZOBACTAM 3.375 G IVPB
3.3750 g | Freq: Three times a day (TID) | INTRAVENOUS | Status: DC
  Administered 2018-08-10 – 2018-08-14 (×13): 3.375 g via INTRAVENOUS
  Filled 2018-08-10 (×13): qty 50

## 2018-08-10 NOTE — Progress Notes (Signed)
Nutrition Follow-up  DOCUMENTATION CODES:   Non-severe (moderate) malnutrition in context of acute illness/injury  INTERVENTION:   - Continue Ensure Enlive po TID, each supplement provides 350 kcal and 20 grams of protein  - Magic cup TID with meals, each supplement provides 290 kcal and 9 grams of protein  - Continue to encourage adequate PO intake  - Education and handout regarding high-protein, high-calorie nutrition therapy provided  NUTRITION DIAGNOSIS:   Moderate Malnutrition related to acute illness (new lung mass) as evidenced by severe muscle depletion, moderate fat depletion, moderate muscle depletion.  Ongoing, being addressed via oral nutrition supplementation  GOAL:   Patient will meet greater than or equal to 90% of their needs  Improving  MONITOR:   PO intake, Supplement acceptance, Weight trends, Labs, Diet advancement  REASON FOR ASSESSMENT:   Consult Assessment of nutrition requirement/status  ASSESSMENT:   Patient with PMH significant for arthritis, GERD, and HTN. Presents this admission with with complaints of hemoptysis and discomfort in his right chest. CT scan revealed large subcarinal mass that is believed to be bronchogenic carcinoma. Plan for bronchoscopy 8/9.   8/9 - s/p Port-a-cath placement  Noted plan for possible radiation treatment tomorrow, 8/12.  Spoke with pt at bedside. RD provided "High-Calorie High-Protein Nutrition Therapy" handout from the Academy of [employer]. Provided examples on ways to increase caloric density of foods and beverages frequently consumed by the patient. Also provided ideas to promote variety and to incorporate additional nutrient dense foods into patient's diet. Discussed eating small frequent meals and snacks to assist in increasing overall po intake. Pt very appreciative of educational materials. All questions answered.  Noted 90% completed breakfast meal tray at bedside at time of RD visit. Pt  reports he is saving the whole milk on his tray for later. Pt endorses having a good appetite currently.  RD discussed nutrition therapy during chemoradiation. RD encouraged pt to schedule appointment with [place] RD for continued support after discharge. Briefly discussed possible side effects of chemoradiation and encouraged pt to discuss his specific symptoms with [place] RD. Encouraged pt to request oral nutrition supplements during treatments.  Noted consult to care management regarding obtaining oral nutrition supplements after discharge.  Meal Completion: 95-100%  Medications reviewed and include: Ensure Enlive TID, 40 mg Protonix daily, Miralax daily, Senokot-S BID  Labs reviewed: magnesium 1.4 (L), hemoglobin 11.6 (L), HCT 34.2 (L)  Diet Order:   Diet Order            Diet regular Room service appropriate? Yes; Fluid consistency: Thin  Diet effective now              EDUCATION NEEDS:   Education needs have been addressed  Skin:  Skin Assessment: Reviewed RN Assessment  Last BM:  08/09/18  Height:   Ht Readings from Last 1 Encounters:  08/07/18 5' 9.5" (1.765 m)    Weight:   Wt Readings from Last 1 Encounters:  08/07/18 60.7 kg    Ideal Body Weight:  75.5 kg  BMI:  Body mass index is 19.49 kg/m.  Estimated Nutritional Needs:   Kcal:  2150-2350 kcal  Protein:  105-120 grams   Fluid:  >/= 2.1 L/day    Ryan Face, MS, RD, LDN Pager: [phone] Weekend/After Hours: [phone]

## 2018-08-10 NOTE — Plan of Care (Deleted)
Patient stable during 7 a to 7 p shift, HR remains in the 90's to low 100's.  Tolerating heart healthy diet, colostomy functioning well.  Patient medicated for incision pain multiple times this shift with improvement.  Patient up to chair and walking in room.  Wick removed from incision as per order, patient tolerated well, dry dressing applied.

## 2018-08-10 NOTE — Progress Notes (Signed)
Pharmacy Antibiotic Note  Ryan Houston is a 72 y.o. male admitted on 08/06/2018 with sepsis.  Pharmacy has been consulted for vancomycin and Zosyn dosing. meds are being started after spiking fevers  Plan:  Zosyn 3.375g IV q8h (4 hour infusion).   Vancomycin 1000 mg IV x1, then 750 mg IV q24h  Daily Scr  Height: 5' 9.5" (176.5 cm) Weight: 133 lb 14.4 oz (60.7 kg) IBW/kg (Calculated) : 71.85  Temp (24hrs), Avg:99.4 F (37.4 C), Min:97.9 F (36.6 C), Max:102.6 F (39.2 C)  Recent Labs  Lab 08/06/18 1509 08/07/18 0442 08/08/18 0418 08/09/18 0108 08/09/18 1909 08/10/18 0326  WBC 10.2* 9.7 9.4 14.6*  --  17.1*  CREATININE 1.40* 1.29* 1.17 1.04  --  1.22  LATICACIDVEN  --   --   --   --  1.8  1.9  --     Estimated Creatinine Clearance: 47 mL/min (by C-G formula based on SCr of 1.22 mg/dL).    No Known Allergies  Antimicrobials this admission:  8/11 vancomycin >>  8/11 Zosyn >>   Dose adjustments this admission: ---  Microbiology results: 8/10 BCx:  8/11 MRSA PCR:   Thank you for allowing pharmacy to be a part of this patient's care.   Royetta Asal, PharmD, BCPS Pager [phone] 08/10/2018 7:13 AM

## 2018-08-10 NOTE — Plan of Care (Addendum)
Patient tolerating regular diet and Ensure without issue.  Pain appears well controlled with long acting pain medication, no additional short acting pain meds required this shift.  T max of 101.9 this shift, came down to normal after Ibuprofen.  Multiple friends and family in to visit.

## 2018-08-10 NOTE — Progress Notes (Signed)
PROGRESS NOTE  JOHNMICHAEL MELHORN Houston:[id number] DOB: 1946/02/09 DOA: 08/06/2018 PCP: Rogers Blocker, MD  HPI/Recap of past 24 hours: Ryan Houston is a 72 y.o. male with medical history significant of HCV, never took treatment for this.  Patient has 1 month h/o hemoptysis, recent weight loss.  Recently diagnosed with lung mass at a local ED.  08/07/18: Seen and examined at his bedside.  Reports dyspnea with minimal exertion.  Right lower lobe mass invading the right lower bronchus and bronchus intermedius.  MRI brain unremarkable for any acute intracranial findings.  Pulmonology and oncology following.  Highly appreciated  08/08/2018: Seen and examined at his bedside.  Continues to have hemoptysis.  Oncology and PCCM following.  Consulted radiation oncology Dr. Lisbeth Renshaw who will see the patient.  08/09/2018: Patient seen and examined with his brother and sister-in-law at bedside.  Febrile overnight with T-max of 102, work-up unrevealing.  Blood cultures x2 in process.  Has a headache that is intermittent.  MRI of brain done during this admission negative for any acute intracranial findings.  08/10/2018: Patient seen and examined at his bedside.  Worsening leukocytosis, tachycardia, and elevated procalcitonin.  Will start IV Vanco and IV Zosyn empirically for suspected sepsis of unclear etiology.  Assessment/Plan: Principal Problem:   Lung mass Active Problems:   Chronic hepatitis C without hepatic coma (HCC)   Cough with hemoptysis   Hypercalcemia of malignancy   Malnutrition of moderate degree  Persistent hemoptysis secondary to right lower lobe mass invading right lower bronchus and bronchus intermedius Status post bronchoscopy with biopsies obtained POD #3 Tissue pathology done on 08/07/2018 suspicious for malignancy Continue to monitor H&H  Dr. Lisbeth Renshaw radiation oncology has been consulted and following.   Oncology and pulmonology following. Port-A-Cath placed in on 08/08/2018 Possible  radiation treatment on Monday, 08/11/2018  Suspected sepsis with unclear etiology Heart rate 110s Worsening leukocytosis from 14 K yesterday Procalcitonin 0.34 Start IV Zosyn and IV vancomycin empirically Obtain MRSA screening if negative DC IV vancomycin UA negative  Chest x-ray independently reviewed did not show any lobular infiltrates that would be indicative of pneumonia Blood cultures x2 in process Obtain CBC in the morning  Suspected COPD with bullous emphysema PCCM following Possible PFTs outpatient  Chronic tobacco use disorder Continue nicotine patch Tobacco cessation counseling done at bedside  Hypomagnesemia Magnesium 1.4 from 1.3 Repleted with IV magnesium 4 g  Resolved euvolemic hyponatremia  Resolved headaches If persistent or migraine may start sumatriptan As needed Tylenol Avoid using more than 2 g due to history of hepatitis C  Moderate protein calorie malnutrition Albumin 2.4 Encourage increasing protein calorie intake  History of hepatitis C Per records it appears that the patient was treated at some point in 2017 through the infectious disease hepatitis C clinic and may not have followed up Follow-up with ID outpatient for treatment of hepatitis C   Code Status: Full  Family Communication: None at bedside  Disposition Plan: Home in 2 to 3 days when radiation oncology signs off.   Consultants:  Oncology  PCCM  Radiation oncology  Procedures:  Bronchoscopy on 08/07/2018  Antimicrobials:  None  DVT prophylaxis: Subcu Lovenox daily   Objective: Vitals:   08/09/18 2215 08/10/18 0530 08/10/18 0610 08/10/18 0825  BP: (!) 96/58 (!) 114/52    Pulse: 86 (!) 108  (!) 103  Resp:  18  16  Temp:  98.7 F (37.1 C)    TempSrc:  Oral    SpO2: 93% Marland Kitchen)  88% 92% 92%  Weight:      Height:        Intake/Output Summary (Last 24 hours) at 08/10/2018 1246 Last data filed at 08/10/2018 1144 Gross per 24 hour  Intake 3704.8 ml  Output 1326 ml    Net 2378.8 ml   Filed Weights   08/06/18 1646 08/07/18 0225  Weight: 59.9 kg 60.7 kg    Exam:  . General: 72 y.o. year-old male well-developed well-nourished in no acute distress.  Alert and oriented x3. . Cardiovascular: Regular rate and rhythm with no rubs or gallops.  No JVD or thyromegaly noted.   Marland Kitchen Respiratory: Mild rales at bases with no wheezes. Good inspiratory effort. . Abdomen: Soft nontender nondistended with normal bowel sounds x4 quadrants. . Musculoskeletal: No lower extremity edema. 2/4 pulses in all 4 extremities. . Skin: No ulcerative lesions noted or rashes . Psychiatry: Mood is appropriate for condition and setting   Data Reviewed: CBC: Recent Labs  Lab 08/06/18 1509 08/07/18 0442 08/08/18 0418 08/09/18 0108 08/10/18 0326  WBC 10.2* 9.7 9.4 14.6* 17.1*  NEUTROABS 6.9*  --   --   --   --   HGB 15.2 14.1 12.4* 12.1* 11.6*  HCT 44.3 41.2 37.2* 35.8* 34.2*  MCV 92.7 94.3 95.4 93.7 94.2  PLT 280 259 223 202 882   Basic Metabolic Panel: Recent Labs  Lab 08/06/18 1509 08/07/18 0442 08/08/18 0418 08/09/18 0108 08/10/18 0326  NA 129 135 136 130* 136  K 4.2 4.7 4.2 3.7 3.5  CL 100 102 104 101 106  CO2 25 27 25 24 25   GLUCOSE 143* 128* 142* 155* 208*  BUN 18 21 18 13 15   CREATININE 1.40* 1.29* 1.17 1.04 1.22  CALCIUM 10.4* 9.3 8.6* 8.4* 8.2*  MG  --   --  1.3* 1.4*  --    GFR: Estimated Creatinine Clearance: 47 mL/min (by C-G formula based on SCr of 1.22 mg/dL). Liver Function Tests: Recent Labs  Lab 08/06/18 1509 08/08/18 0418 08/10/18 0326  AST 165* 101* 55*  ALT 122* 86* 55*  ALKPHOS 78 82 89  BILITOT 0.7 0.3 0.4  PROT 10.8* 7.7 7.4  ALBUMIN 3.1* 2.4* 2.2*   No results for input(s): LIPASE, AMYLASE in the last 168 hours. No results for input(s): AMMONIA in the last 168 hours. Coagulation Profile: Recent Labs  Lab 08/08/18 1006  INR 1.17   Cardiac Enzymes: No results for input(s): CKTOTAL, CKMB, CKMBINDEX, TROPONINI in the last  168 hours. BNP (last 3 results) No results for input(s): PROBNP in the last 8760 hours. HbA1C: No results for input(s): HGBA1C in the last 72 hours. CBG: No results for input(s): GLUCAP in the last 168 hours. Lipid Profile: No results for input(s): CHOL, HDL, LDLCALC, TRIG, CHOLHDL, LDLDIRECT in the last 72 hours. Thyroid Function Tests: No results for input(s): TSH, T4TOTAL, FREET4, T3FREE, THYROIDAB in the last 72 hours. Anemia Panel: No results for input(s): VITAMINB12, FOLATE, FERRITIN, TIBC, IRON, RETICCTPCT in the last 72 hours. Urine analysis:    Component Value Date/Time   COLORURINE STRAW (A) 08/09/2018 0029   APPEARANCEUR CLEAR 08/09/2018 0029   LABSPEC 1.010 08/09/2018 0029   PHURINE 6.0 08/09/2018 0029   GLUCOSEU NEGATIVE 08/09/2018 0029   HGBUR NEGATIVE 08/09/2018 0029   BILIRUBINUR NEGATIVE 08/09/2018 0029   KETONESUR NEGATIVE 08/09/2018 0029   PROTEINUR NEGATIVE 08/09/2018 0029   NITRITE NEGATIVE 08/09/2018 0029   LEUKOCYTESUR NEGATIVE 08/09/2018 0029   Sepsis Labs: @LABRCNTIP (procalcitonin:4,lacticidven:4)  ) Recent Results (from  the past 240 hour(s))  Culture, blood (routine x 2)     Status: None (Preliminary result)   Collection Time: 08/09/18  1:06 AM  Result Value Ref Range Status   Specimen Description   Final    BLOOD RIGHT FOREARM Performed at [facility], [place] [address], [place], [place] [address]    Special Requests   Final    BOTTLES DRAWN AEROBIC AND ANAEROBIC Blood Culture adequate volume Performed at [facility] [address] [place], [place] [address]    Culture PENDING  Incomplete   Report Status PENDING  Incomplete      Studies: No results found.  Scheduled Meds: . azelastine  1 spray Each Nare BID  . feeding supplement (ENSURE ENLIVE)  237 mL Oral TID BM  . gabapentin  300 mg Oral TID  . ipratropium-albuterol  3 mL Nebulization TID  . montelukast  10 mg Oral QHS  . nicotine  21 mg Transdermal Q24H   . oxyCODONE  10 mg Oral Q12H  . pantoprazole  40 mg Oral Daily  . polyethylene glycol  17 g Oral Daily  . senna-docusate  1 tablet Oral BID    Continuous Infusions: . sodium chloride 75 mL/hr (08/10/18 0518)  . sodium chloride 10 mL/hr at 08/10/18 0757  . piperacillin-tazobactam (ZOSYN)  IV 3.375 g (08/10/18 0807)  . [START ON 08/11/2018] vancomycin       LOS: 4 days     Kayleen Memos, MD [facility] Pager [phone]  If 7PM-7AM, please contact night-coverage [email or web address] Password [facility] 08/10/2018, 12:46 PM

## 2018-08-11 ENCOUNTER — Institutional Professional Consult (permissible substitution): Payer: Medicare Other | Admitting: Radiation Oncology

## 2018-08-11 ENCOUNTER — Ambulatory Visit
Admit: 2018-08-11 | Discharge: 2018-08-11 | Disposition: A | Discharge status: Home / Self Care | Payer: Medicare Other | Source: Ambulatory Visit | Attending: Radiation Oncology | Admitting: Radiation Oncology

## 2018-08-11 ENCOUNTER — Ambulatory Visit
Admit: 2018-08-11 | Discharge: 2018-08-11 | Disposition: A | Discharge status: Home / Self Care | Payer: Medicare Other | Attending: Radiation Oncology | Admitting: Radiation Oncology

## 2018-08-11 DIAGNOSIS — C3490 Malignant neoplasm of unspecified part of unspecified bronchus or lung: Secondary | ICD-10-CM

## 2018-08-11 DIAGNOSIS — C3431 Malignant neoplasm of lower lobe, right bronchus or lung: Secondary | ICD-10-CM

## 2018-08-11 DIAGNOSIS — R509 Fever, unspecified: Secondary | ICD-10-CM

## 2018-08-11 LAB — CBC WITH DIFFERENTIAL/PLATELET
Basophils Absolute: 0 10*3/uL (ref 0.0–0.1)
Basophils Relative: 0 %
Eosinophils Absolute: 0.2 10*3/uL (ref 0.0–0.7)
Eosinophils Relative: 1 %
HEMATOCRIT: 33.2 % — AB (ref 39.0–52.0)
Hemoglobin: 11.2 g/dL — ABNORMAL LOW (ref 13.0–17.0)
LYMPHS PCT: 10 %
Lymphs Abs: 1.5 10*3/uL (ref 0.7–4.0)
MCH: 31.6 pg (ref 26.0–34.0)
MCHC: 33.7 g/dL (ref 30.0–36.0)
MCV: 93.8 fL (ref 78.0–100.0)
Monocytes Absolute: 1.7 10*3/uL — ABNORMAL HIGH (ref 0.1–1.0)
Monocytes Relative: 11 %
NEUTROS ABS: 11.6 10*3/uL — AB (ref 1.7–7.7)
Neutrophils Relative %: 78 %
Platelets: 224 10*3/uL (ref 150–400)
RBC: 3.54 MIL/uL — AB (ref 4.22–5.81)
RDW: 13 % (ref 11.5–15.5)
WBC: 15 10*3/uL — AB (ref 4.0–10.5)

## 2018-08-11 LAB — BASIC METABOLIC PANEL
Anion gap: 4 — ABNORMAL LOW (ref 5–15)
BUN: 12 mg/dL (ref 8–23)
CO2: 26 mmol/L (ref 22–32)
Calcium: 8.2 mg/dL — ABNORMAL LOW (ref 8.9–10.3)
Chloride: 107 mmol/L (ref 98–111)
Creatinine, Ser: 1.13 mg/dL (ref 0.61–1.24)
GFR calc Af Amer: >60 mL/min (ref >60)
GFR calc non Af Amer: >60 mL/min (ref >60)
GLUCOSE: 192 mg/dL — AB (ref 70–99)
POTASSIUM: 3.8 mmol/L (ref 3.5–5.1)
Sodium: 137 mmol/L (ref 135–145)

## 2018-08-11 MED ORDER — SODIUM CHLORIDE 0.9 % IV BOLUS
500.0000 mL | Freq: Once | INTRAVENOUS | Status: AC
  Administered 2018-08-11: 500 mL via INTRAVENOUS

## 2018-08-11 NOTE — Progress Notes (Signed)
Max temperature overnight was 102. Patient had visible chills/ shakes with temperature. Patient was treated with Ibuprofen and MD on call was notified. Patient continues to complain of back and head pain and was also placed on 2l of oxygen to keep saturations in the high 90's Will continue to monitor patient.

## 2018-08-11 NOTE — Progress Notes (Addendum)
PCCM Update:  Inpatient bronchoscopy with EBUS+TBNA will be scheduled for [facility] on Wednesday morning at 11:00AM.  He will need to be NPO on Tuesday night at midnight.   Garner Nash, DO [facility] 08/11/2018 4:53 PM  Personal pager: [phone] If unanswered, please page CCM On-call: [phone]

## 2018-08-11 NOTE — Progress Notes (Addendum)
Ryan Houston is doing okay.  He had a temperature last night.  He is on some IV antibiotics.  The biopsy came back "suspicious for malignancy.".  This is totally inadequate for [place].  I really hope that there or more biopsies that can be looked at.  If not, then he will need to have another bronchoscopy.  I really need to have more than just "suspicious for malignancy."  I need to know what the histology is.  Again, hopefully there are additional biopsies that the pathologist can look at.  His Port-A-Cath was placed already.  I appreciate radiology doing this.  May be, he will start radiation therapy.  I still have not seen a note from radiation oncology.  His CBC looks okay.  His white cell count is 15.  Hemoglobin 11.2.  Platelet count 224,000.  He does have the hepatitis C.  I still do not see that he is on treatment for this.  If we are going to treat him aggressively, he MUST be on therapy for hepatitis C.  He apparently has Harvoni at home.  Is there anyway this can be started in the hospital?  He still has the IV in his left upper arm.  Maybe this can be taken out.  His bone scan is negative for any metastatic disease.  As such, he has stage III bronchogenic carcinoma.  My concern now is the pathology.  Again, I need a whole lot more than just "suspicious for malignancy".  I will speak with to pathology to see if they have any more specimens for [place].  Somehow, I suspect that he is going to need another bronchoscopy.  I appreciate everybody's great care that they are giving Mr. Lo up on 4 W.   Lattie Haw, MD  1 Mikeal Hawthorne 12:16  ADDENDUM:  I spoke with pathology.  Dr. Saralyn Pilar said that there is NOT enough tissue to make a firm dx of lung cancer.  He cannot tell us if this is squamous cell or adenoca.  As such, we will need more tissue.  I CANNOT treat him with chemo until we have know the actual histology.    Maybe another FOB can be done.  If not, then a mediastinoscopy might be  necessary to get enough tissue.  Lattie Haw, MD

## 2018-08-11 NOTE — Progress Notes (Signed)
PROGRESS NOTE  Ryan Houston OMV:[id number] DOB: [birth year] DOA: 08/06/2018 PCP: Rogers Blocker, MD  HPI/Recap of past 24 hours: Ryan Houston is a 72 y.o. male with medical history significant of HCV, tobacco use disorder who presented with 1 month of hemoptysis and recent unintentional weight loss.  Recently diagnosed with lung mass at a local ED. CT chest with contrast done on 08/06/2018 revealed 5.8 x 3.2 cm subcarinal and medial right lower lobe lung mass invading and occluding the right lower lobe bronchus and bronchus intermedius.  PCCM consulted for biopsies and radiation oncology for symptoms relief.  Tissue pathology with suspicion for malignancy.  Hospital course complicated by intermittent fevers, moderate to severe back pain.  Started on broad-spectrum IV antibiotics empirically.  Blood cultures x2- to date.  Pain medications adjusted.  Plan for inpatient EBUS and TBNA for cell block by PCCM.  08/11/2018: Patient seen and examined at his bedside.  He has no new complaints.  His pain is well controlled on current management.  Denies dyspnea at rest.   Assessment/Plan: Principal Problem:   Right lower lobe lung mass Active Problems:   Chronic hepatitis C without hepatic coma (HCC)   Hemoptysis   Hypercalcemia of malignancy   Malnutrition of moderate degree   Malignant neoplasm of lung (HCC)   Fever  Persistent hemoptysis secondary to right lower lobe mass invading right lower bronchus and bronchus intermedius Status post bronchoscopy with biopsies obtained POD #4 Tissue pathology done on 08/07/2018 suspicious for malignancy Continue to monitor H&H  Dr. Lisbeth Renshaw radiation oncology has been consulted and following.   Oncology and pulmonology following. Port-A-Cath placed in on 08/08/2018 Medical oncology needs more information regarding results of pathology May need more tissue to obtain histology PCCM for possible EBUS and TBNA  Suspected sepsis with unclear etiology Heart  rate 110s, WBC 17 K yesterday Leukocytosis is trending down Procalcitonin 0.34 on 08/10/2018 Continue IV Zosyn empirically MRSA screening negative, DC IV vancomycin UA negative  Chest x-ray independently reviewed did not show any lobular infiltrates that would be indicative of pneumonia Blood cultures x2 negative to date Monitor WBC and obtain CBC in the morning  Suspected COPD with bullous emphysema PCCM following Possible PFTs outpatient  Chronic tobacco use disorder Continue nicotine patch Tobacco cessation counseling done at bedside  Hypomagnesemia, repleted Magnesium 1.4 from 1.3 Repleted with IV magnesium 4 g  Resolved euvolemic hyponatremia  Resolved headaches If persistent or migraine may start sumatriptan As needed Tylenol Avoid using more than 2 g due to history of hepatitis C  Severe protein calorie malnutrition Albumin 2.2 and BMI 19 Encourage increasing protein calorie intake Continue oral supplement  History of hepatitis C Per records it appears that the patient was treated at some point in 2017 through the infectious disease hepatitis C clinic and may not have followed up Follow-up with ID outpatient for treatment of hepatitis C   Code Status: Full  Family Communication: None at bedside  Disposition Plan: Home in 2 to 3 days when PCCM, medical oncology, radiation oncology sign off.   Consultants:  Medical oncology  PCCM  Radiation oncology  Procedures:  Bronchoscopy on 08/07/2018  Antimicrobials:  IV Zosyn  DVT prophylaxis: Subcu Lovenox daily   Objective: Vitals:   08/11/18 0804 08/11/18 1212 08/11/18 1453 08/11/18 1545  BP:  111/61    Pulse:  88    Resp:  19    Temp:  98.4 F (36.9 C)    TempSrc:  Oral  SpO2: 92% 96% (!) 87% 90%  Weight:      Height:        Intake/Output Summary (Last 24 hours) at 08/11/2018 1555 Last data filed at 08/11/2018 1543 Gross per 24 hour  Intake 2475.11 ml  Output 1790 ml  Net 685.11 ml    Filed Weights   08/06/18 1646 08/07/18 0225  Weight: 59.9 kg 60.7 kg    Exam:  . General: 72 y.o. year-old male frail in no acute distress.  Alert and oriented x3. . Cardiovascular: Regular rate and rhythm with no rubs or gallops.  No JVD or thyromegaly noted. Marland Kitchen Respiratory: Mild rales at bases with no wheezes. Good inspiratory effort. . Abdomen: Soft nontender nondistended with normal bowel sounds x4 quadrants. . Musculoskeletal: No lower extremity edema. 2/4 pulses in all 4 extremities. . Skin: No ulcerative lesions noted or rashes . Psychiatry: Mood is appropriate for condition and setting   Data Reviewed: CBC: Recent Labs  Lab 08/06/18 1509 08/07/18 0442 08/08/18 0418 08/09/18 0108 08/10/18 0326 08/11/18 0410  WBC 10.2* 9.7 9.4 14.6* 17.1* 15.0*  NEUTROABS 6.9*  --   --   --   --  11.6*  HGB 15.2 14.1 12.4* 12.1* 11.6* 11.2*  HCT 44.3 41.2 37.2* 35.8* 34.2* 33.2*  MCV 92.7 94.3 95.4 93.7 94.2 93.8  PLT 280 259 223 202 191 226   Basic Metabolic Panel: Recent Labs  Lab 08/07/18 0442 08/08/18 0418 08/09/18 0108 08/10/18 0326 08/11/18 0410  NA 135 136 130* 136 137  K 4.7 4.2 3.7 3.5 3.8  CL 102 104 101 106 107  CO2 27 25 24 25 26   GLUCOSE 128* 142* 155* 208* 192*  BUN 21 18 13 15 12   CREATININE 1.29* 1.17 1.04 1.22 1.13  CALCIUM 9.3 8.6* 8.4* 8.2* 8.2*  MG  --  1.3* 1.4*  --   --    GFR: Estimated Creatinine Clearance: 50.7 mL/min (by C-G formula based on SCr of 1.13 mg/dL). Liver Function Tests: Recent Labs  Lab 08/06/18 1509 08/08/18 0418 08/10/18 0326  AST 165* 101* 55*  ALT 122* 86* 55*  ALKPHOS 78 82 89  BILITOT 0.7 0.3 0.4  PROT 10.8* 7.7 7.4  ALBUMIN 3.1* 2.4* 2.2*   No results for input(s): LIPASE, AMYLASE in the last 168 hours. No results for input(s): AMMONIA in the last 168 hours. Coagulation Profile: Recent Labs  Lab 08/08/18 1006  INR 1.17   Cardiac Enzymes: No results for input(s): CKTOTAL, CKMB, CKMBINDEX, TROPONINI in the  last 168 hours. BNP (last 3 results) No results for input(s): PROBNP in the last 8760 hours. HbA1C: No results for input(s): HGBA1C in the last 72 hours. CBG: No results for input(s): GLUCAP in the last 168 hours. Lipid Profile: No results for input(s): CHOL, HDL, LDLCALC, TRIG, CHOLHDL, LDLDIRECT in the last 72 hours. Thyroid Function Tests: No results for input(s): TSH, T4TOTAL, FREET4, T3FREE, THYROIDAB in the last 72 hours. Anemia Panel: No results for input(s): VITAMINB12, FOLATE, FERRITIN, TIBC, IRON, RETICCTPCT in the last 72 hours. Urine analysis:    Component Value Date/Time   COLORURINE STRAW (A) 08/09/2018 0029   APPEARANCEUR CLEAR 08/09/2018 0029   LABSPEC 1.010 08/09/2018 0029   PHURINE 6.0 08/09/2018 0029   GLUCOSEU NEGATIVE 08/09/2018 0029   HGBUR NEGATIVE 08/09/2018 0029   BILIRUBINUR NEGATIVE 08/09/2018 0029   KETONESUR NEGATIVE 08/09/2018 0029   PROTEINUR NEGATIVE 08/09/2018 0029   NITRITE NEGATIVE 08/09/2018 0029   LEUKOCYTESUR NEGATIVE 08/09/2018 0029   Sepsis  Labs: @LABRCNTIP (procalcitonin:4,lacticidven:4)  ) Recent Results (from the past 240 hour(s))  Culture, blood (routine x 2)     Status: None (Preliminary result)   Collection Time: 08/09/18  1:06 AM  Result Value Ref Range Status   Specimen Description   Final    BLOOD RIGHT FOREARM Performed at [facility], [address] [place], [place] [address]    Special Requests   Final    BOTTLES DRAWN AEROBIC AND ANAEROBIC Blood Culture adequate volume Performed at [facility] [address] [place], [place] [address]    Culture   Final    NO GROWTH 2 DAYS Performed at [facility] [address], [place], [place] [address]    Report Status PENDING  Incomplete  Culture, blood (routine x 2)     Status: None (Preliminary result)   Collection Time: 08/09/18  1:36 AM  Result Value Ref Range Status   Specimen Description   Final    BLOOD PORTA CATH Performed at  [facility] [address], [place], [place] [address]    Special Requests   Final    BOTTLES DRAWN AEROBIC AND ANAEROBIC Blood Culture adequate volume Performed at [facility] [address] [place], [place] [address]    Culture   Final    NO GROWTH 2 DAYS Performed at [facility] [address], [place], [place] [address]    Report Status PENDING  Incomplete  MRSA PCR Screening     Status: None   Collection Time: 08/10/18  6:46 AM  Result Value Ref Range Status   MRSA by PCR NEGATIVE NEGATIVE Final    Comment:        The GeneXpert MRSA Assay (FDA approved for NASAL specimens only), is one component of a comprehensive MRSA colonization surveillance program. It is not intended to diagnose MRSA infection nor to guide or monitor treatment for MRSA infections. Performed at [facility], [place] [address] [place], [place] [address]       Studies: No results found.  Scheduled Meds: . azelastine  1 spray Each Nare BID  . feeding supplement (ENSURE ENLIVE)  237 mL Oral TID BM  . gabapentin  300 mg Oral TID  . ipratropium-albuterol  3 mL Nebulization TID  . mirabegron ER  25 mg Oral Daily  . montelukast  10 mg Oral QHS  . nicotine  21 mg Transdermal Q24H  . oxyCODONE  10 mg Oral Q12H  . pantoprazole  40 mg Oral Daily  . polyethylene glycol  17 g Oral Daily  . senna-docusate  1 tablet Oral BID    Continuous Infusions: . sodium chloride 75 mL/hr at 08/11/18 1543  . sodium chloride 10 mL/hr at 08/11/18 1543  . piperacillin-tazobactam (ZOSYN)  IV Stopped (08/11/18 1401)     LOS: 5 days     Kayleen Memos, MD [facility] Pager [phone]  If 7PM-7AM, please contact night-coverage [email or web address] Password [facility] 08/11/2018, 3:55 PM

## 2018-08-11 NOTE — Progress Notes (Addendum)
PULMONARY / CRITICAL CARE MEDICINE   Name: Ryan Houston MRN: [id number] DOB: 1946-11-28    ADMISSION DATE:  08/06/2018 CONSULTATION DATE:  08/07/2090  REFERRING MD:  Dr. Alcario Drought  CHIEF COMPLAINT:  Hemoptysis  Brief hospital summary:    72 yo male smoker developed hemoptysis about 2 weeks prior to admission. Admitted 8/7. He has also been getting discomfort in his right chest.  He has noticed some blurred vision and is more hoarse.  He has lost about 30 lbs over the past two months.  CT chest w/ mass in subcarinal area probably invading right main bronchus.  8/8: bronchoscopy:  Right Lung Abnormalities: A partially obstructing (about 70% obstructed)   mass lesion was found proximally in the bronchus intermedius.  Endobronchial biopsies x4 specimens obtained. 8/9:   MRI brain neg for mets. Seen by radiation onc. Porta cath placed.  8/10: spiking fevers.  8/11: spiking fevers still. Started on vanc and zosyn 8/12: Radiation to bronchus today    SUBJECTIVE: I dicussed recent biopsy results and need for additional tissue sampling. No issues overnight. No complaints. No additional hemoptysis.   VITAL SIGNS: BP 111/61 (BP Location: Left Arm)   Pulse 88   Temp 98.4 F (36.9 C) (Oral)   Resp 19   Ht 5' 9.5" (1.765 m)   Wt 60.7 kg   SpO2 96%   BMI 19.49 kg/m   INTAKE / OUTPUT: I/O last 3 completed shifts: In: 5518.6 [P.O.:2700; I.V.:2668.6; IV Piggyback:150] Out: 3316 [Urine:3315; Stool:1]  PHYSICAL EXAMINATION: General appearance: 72 y.o., male, NAD, conversant, thin, Eyes: anicteric sclerae, moist conjunctivae; tracking appropriately HENT: NCAT; oropharynx, MMM; normal hard and soft palate Neck: Trachea midline; supple, lymphadenopathy, no JVD Lungs: diminished breaths sounds bilaterally, no wheeze  CV: RRR, S1, S2, no MRGs  Abdomen: Soft, non-tender; non-distended Extremities: No peripheral edema or radial and DP pulses present bilaterally  Skin: Normal temperature,  turgor and texture; no rash Psych: Appropriate affect, normal mood  Neuro: Alert and oriented to person and place, no focal deficit   LABS:  BMET Recent Labs  Lab 08/09/18 0108 08/10/18 0326 08/11/18 0410  NA 130* 136 137  K 3.7 3.5 3.8  CL 101 106 107  CO2 24 25 26   BUN 13 15 12   CREATININE 1.04 1.22 1.13  GLUCOSE 155* 208* 192*    Electrolytes Recent Labs  Lab 08/08/18 0418 08/09/18 0108 08/10/18 0326 08/11/18 0410  CALCIUM 8.6* 8.4* 8.2* 8.2*  MG 1.3* 1.4*  --   --     CBC Recent Labs  Lab 08/09/18 0108 08/10/18 0326 08/11/18 0410  WBC 14.6* 17.1* 15.0*  HGB 12.1* 11.6* 11.2*  HCT 35.8* 34.2* 33.2*  PLT 202 191 224    Coag's Recent Labs  Lab 08/08/18 1006  INR 1.17    Sepsis Markers Recent Labs  Lab 08/09/18 1909  LATICACIDVEN 1.8  1.9  PROCALCITON 0.34    ABG No results for input(s): PHART, PCO2ART, PO2ART in the last 168 hours.  Liver Enzymes Recent Labs  Lab 08/06/18 1509 08/08/18 0418 08/10/18 0326  AST 165* 101* 55*  ALT 122* 86* 55*  ALKPHOS 78 82 89  BILITOT 0.7 0.3 0.4  ALBUMIN 3.1* 2.4* 2.2*    Cardiac Enzymes No results for input(s): TROPONINI, PROBNP in the last 168 hours.  Glucose No results for input(s): GLUCAP in the last 168 hours.  Imaging No results found.  CT CHEST - large station 7 node, right sided endobronchial disease, amendable to EBUS, my  read The patient's images have been independently reviewed by me.   STUDIES:  CT chest 8/07 >> subcarinal mass 5.8 x 3.2 cmwith invasion/occlusion RLL bronchus and bronchus intermedius, 16 mm Rt hilar LAN, 11 mm precarinal LAN, b/l calcified pleural plaques, bullous emphysema (reviewed myself) CT abd/pelvis 8/07 >> Lt renal cyst, enlarged prostate, small Rt inguinal hernia (reviewed myself) MRI brain 8/08 >> atrophy and white matter changes  SIGNIFICANT EVENTS: 8/07 Admit, oncology consulted  DISCUSSION: 72 yo male smoker with hemoptysis, hoarseness, weight  loss.  Found to have subcarinal mass with invasion of right lung airways.  Also has pleural plaques.  Main concern is for primary lung cancer.  ASSESSMENT / PLAN:  Right sided endobronchial lesion with enlarged station 7, likely stage III disease by CT imaging -path reported as "suspicious for malignancy". Oncology needing additional histology  Plan We will plan for inpatient EBUS+TBNA for cell block  Case was discussed with Dr. Marin Olp via phone.   Concern for pneumonia, possibly post-obstructive  Febrile  Plan Continue IV abx.  We will send cultures on repeat bronch   Emphysema, Current Smoker, likely COPD, no PFTs on file  Plan He will need outpatient PFTs  Can continue scheduled bronchodilators   Pleural plaques. Plan Observe   DVT prophylaxis - SCDs SUP - not indicate Nutrition -regular  Goals of care - full code  Garner Nash, DO [facility] 08/11/2018 2:25 PM  Personal pager: [phone] If unanswered, please page CCM On-call: [phone]

## 2018-08-12 ENCOUNTER — Inpatient Hospital Stay (HOSPITAL_COMMUNITY): Payer: Medicare Other

## 2018-08-12 ENCOUNTER — Ambulatory Visit: Payer: Medicare Other

## 2018-08-12 ENCOUNTER — Ambulatory Visit
Admit: 2018-08-12 | Discharge: 2018-08-12 | Disposition: A | Discharge status: Home / Self Care | Payer: Medicare Other | Attending: Radiation Oncology | Admitting: Radiation Oncology

## 2018-08-12 DIAGNOSIS — C3491 Malignant neoplasm of unspecified part of right bronchus or lung: Secondary | ICD-10-CM

## 2018-08-12 NOTE — Progress Notes (Signed)
PULMONARY / CRITICAL CARE MEDICINE  Name: Ryan Houston MRN: [id number] DOB: 02-Jun-1946    ADMISSION DATE:  08/06/2018 CONSULTATION DATE:  08/07/2090  REFERRING MD:  Dr. Alcario Drought  CHIEF COMPLAINT:  Hemoptysis  Brief hospital summary:    72 yo male smoker developed hemoptysis about 2 weeks prior to admission. Admitted 8/7. He has also been getting discomfort in his right chest.  He has noticed some blurred vision and is more hoarse.  He has lost about 30 lbs over the past two months.  CT chest w/ mass in subcarinal area probably invading right main bronchus.  8/8: bronchoscopy:  Right Lung Abnormalities: A partially obstructing (about 70% obstructed)   mass lesion was found proximally in the bronchus intermedius.  Endobronchial biopsies x4 specimens obtained. 8/9:   MRI brain neg for mets. Seen by radiation onc. Porta cath placed.  8/10: spiking fevers.  8/11: spiking fevers still. Started on vanc and zosyn 8/12: Radiation to bronchus today  8/14: Repeat bronch, EBUS + TBNA   SUBJECTIVE: First radiation treatment was yesterday. Denies ongoing hemoptysis.   VITAL SIGNS: BP (!) 141/84 (BP Location: Left Arm)   Pulse 91   Temp 98 F (36.7 C) (Oral)   Resp 18   Ht 5' 9.5" (1.765 m)   Wt 60.7 kg   SpO2 94% Comment: requested Sp02 goal- RN aware  BMI 19.49 kg/m   INTAKE / OUTPUT: I/O last 3 completed shifts: In: 3587.4 [P.O.:360; I.V.:2539.8; IV Piggyback:687.6] Out: 2340 [Urine:2340]  PHYSICAL EXAMINATION: General appearance: 72 y.o., male, NAD, conversant  Eyes: anicteric sclerae, moist conjunctivae; no lid-lag; PERRL, tracking appropriately HENT: NCAT; oropharynx, MMM, Neck: Trachea midline, supple, lymphadenopathy, no JVD Lungs: diminished breath sounds bilaterally, no wheezing  CV: RRR, S1, S2, no MRGs  Abdomen: Soft, non-tender; non-distended Extremities: No peripheral edema or radial and DP pulses present bilaterally  Skin: Normal temperature, turgor and texture; no  rash Psych: Appropriate affect, appropriate mood  Neuro: Alert and oriented to person and place, no focal deficit   LABS:  BMET Recent Labs  Lab 08/09/18 0108 08/10/18 0326 08/11/18 0410  NA 130* 136 137  K 3.7 3.5 3.8  CL 101 106 107  CO2 24 25 26   BUN 13 15 12   CREATININE 1.04 1.22 1.13  GLUCOSE 155* 208* 192*    Electrolytes Recent Labs  Lab 08/08/18 0418 08/09/18 0108 08/10/18 0326 08/11/18 0410  CALCIUM 8.6* 8.4* 8.2* 8.2*  MG 1.3* 1.4*  --   --     CBC Recent Labs  Lab 08/09/18 0108 08/10/18 0326 08/11/18 0410  WBC 14.6* 17.1* 15.0*  HGB 12.1* 11.6* 11.2*  HCT 35.8* 34.2* 33.2*  PLT 202 191 224    Coag's Recent Labs  Lab 08/08/18 1006  INR 1.17    Sepsis Markers Recent Labs  Lab 08/09/18 1909  LATICACIDVEN 1.8  1.9  PROCALCITON 0.34    ABG No results for input(s): PHART, PCO2ART, PO2ART in the last 168 hours.  Liver Enzymes Recent Labs  Lab 08/06/18 1509 08/08/18 0418 08/10/18 0326  AST 165* 101* 55*  ALT 122* 86* 55*  ALKPHOS 78 82 89  BILITOT 0.7 0.3 0.4  ALBUMIN 3.1* 2.4* 2.2*    Cardiac Enzymes No results for input(s): TROPONINI, PROBNP in the last 168 hours.  Glucose No results for input(s): GLUCAP in the last 168 hours.  Imaging CT CHEST - large station 7 node, right sided endobronchial disease, amendable to EBUS, my read The patient's images have been independently  reviewed by me.   STUDIES:  CT chest 8/07 >> subcarinal mass 5.8 x 3.2 cmwith invasion/occlusion RLL bronchus and bronchus intermedius, 16 mm Rt hilar LAN, 11 mm precarinal LAN, b/l calcified pleural plaques, bullous emphysema (reviewed myself) CT abd/pelvis 8/07 >> Lt renal cyst, enlarged prostate, small Rt inguinal hernia (reviewed myself) MRI brain 8/08 >> atrophy and white matter changes  SIGNIFICANT EVENTS: 8/07 Admit, oncology consulted  DISCUSSION: 72 yo male smoker with hemoptysis, hoarseness, weight loss.  Found to have subcarinal mass with  invasion of right lung airways.  Also has pleural plaques.  Main concern is for primary lung cancer.  ASSESSMENT / PLAN:  Right sided endobronchial lesion with enlarged station 7, likely stage III disease by CT imaging -path reported as "suspicious for malignancy". Oncology needing additional histology  Planned EBUS + TBNA for cell block tomorrow morning  Discussed the risks, benefits and alternatives to tissue sampling with repeat bronchoscopy  The patient understands and agrees to move forward with this plan.  This was the first available time in endoscopy  Tolerated 1st radiation treatment yesterday.   Concern for pneumonia, possibly post-obstructive  Continue abx, pip/tazo  Sputum cultures sent today  CXR today   We will plan to send cultures from bronch tomorrow  Emphysema, Current Smoker, likely COPD, no PFTs on file  Outpatient PFTS upon discharge Follow up in the pulmonary clinic with me upon discharge   Pleural plaques. Observe   DVT prophylaxis - SCDs SUP - not indicate Nutrition -regular  Goals of care - full code  Garner Nash, DO [facility] 08/12/2018 9:13 AM  Personal pager: [phone] If unanswered, please page CCM On-call: [phone]

## 2018-08-12 NOTE — Care Management Important Message (Signed)
Important Message  Patient Details  Name: Ryan Houston MRN: [id number] Date of Birth: [birth year]   Medicare Important Message Given:  Yes    Kerin Salen 08/12/2018, 11:12 AMImportant Message  Patient Details  Name: Ryan Houston MRN: [id number] Date of Birth: 05/09/1946   Medicare Important Message Given:  Yes    Kerin Salen 08/12/2018, 11:12 AM

## 2018-08-12 NOTE — Progress Notes (Signed)
PROGRESS NOTE    Ryan Houston  ENI:[id number] DOB: 1946-12-22 DOA: 08/06/2018 PCP: Rogers Blocker, MD    Brief Narrative:72 y.o.malewith medical history significant ofHCV, tobacco use disorder who presented with 1 month of hemoptysis and recent unintentional weight loss. Recently diagnosed with lung mass at a local ED. CT chest with contrast done on 08/06/2018 revealed 5.8 x 3.2 cm subcarinal and medial right lower lobe lung mass invading and occluding the right lower lobe bronchus and bronchus intermedius.  PCCM consulted for biopsies and radiation oncology for symptoms relief.  Tissue pathology with suspicion for malignancy.  Hospital course complicated by intermittent fevers, moderate to severe back pain.  Started on broad-spectrum IV antibiotics empirically.  Blood cultures x2- to date.  Pain medications adjusted.  Plan for inpatient EBUS and TBNA for cell block by PCCM.  08/11/2018: Patient seen and examined at his bedside.  He has no new complaints.  His pain is well controlled on current management.  Denies dyspnea at rest.   Assessment & Plan:   Principal Problem:   Lung mass Active Problems:   Chronic hepatitis C without hepatic coma (HCC)   Hemoptysis   Hypercalcemia of malignancy   Malnutrition of moderate degree   Malignant neoplasm of lung (HCC)   Fever  Persistent hemoptysis secondary to right lower lobe mass invading right lower bronchus and bronchus intermedius Status post bronchoscopy with biopsies obtained POD #5 Tissue pathology done on 08/07/2018 suspicious for malignancy Continue to monitor H&H  Dr. Lisbeth Renshaw radiation oncology has been consulted and following.   Oncology and pulmonology following. Port-A-Cath placed in on 08/08/2018 Medical oncology needs more information regarding results of pathology May need more tissue to obtain histology for repeat bronc 08/12/2018 PCCM for possible EBUS and TBNA  Suspected sepsis with unclear etiology Heart rate 110s,  WBC 17 K yesterday Leukocytosis is trending down Procalcitonin 0.34 on 08/10/2018 Continue IV Zosyn empirically MRSA screening negative, DC IV vancomycin UA negative  Chest x-ray independently reviewed did not show any lobular infiltrates that would be indicative of pneumonia Blood cultures x2 negative to date WBC is trending down.  Suspected COPD with bullous emphysema PCCM following Possible PFTs outpatient  Chronic tobacco use disorder Continue nicotine patch Tobacco cessation counseling done at bedside  Resolved headaches If persistent or migraine may start sumatriptan As needed Tylenol Avoid using more than 2 g due to history of hepatitis C  Severe protein calorie malnutrition Albumin 2.2 and BMI 19 Encourage increasing protein calorie intake Continue oral supplement History of hepatitis C Per records it appears that the patient was treated at some point in 2017 through the infectious disease hepatitis C clinic and may not have followed up Follow-up with ID outpatient for treatment of hepatitis C   DVT prophylaxis: Lovenox  Code Status: Full code Family Communication: None Disposition Plan: TBD  Consultants: PCCM, medical oncology, radiation oncology   Procedures: Bronc 08/07/2018 repeat found to be done.  13 2019 Antimicrobials: Zosyn  Subjective: Resting in bed denies any further hemoptysis no nausea vomiting cough shortness of breath. Objective: Vitals:   08/12/18 0637 08/12/18 0858 08/12/18 0907 08/12/18 0915  BP: (!) 141/84     Pulse: 91     Resp: 18     Temp: 98 F (36.7 C)   (!) 102.1 F (38.9 C)  TempSrc: Oral   Rectal  SpO2: 96% (!) 73% 94%   Weight:      Height:        Intake/Output Summary (  Last 24 hours) at 08/12/2018 1228 Last data filed at 08/12/2018 0515 Gross per 24 hour  Intake 2546.86 ml  Output 700 ml  Net 1846.86 ml   Filed Weights   08/06/18 1646 08/07/18 0225  Weight: 59.9 kg 60.7 kg    Examination:  General exam:  Appears calm and comfortable  Respiratory system: scattered rhonchi  to auscultation. Respiratory effort normal. Cardiovascular system: S1 & S2 heard, RRR. No JVD, murmurs, rubs, gallops or clicks. No pedal edema. Gastrointestinal system: Abdomen is nondistended, soft and nontender. No organomegaly or masses felt. Normal bowel sounds heard. Central nervous system: Alert and oriented. No focal neurological deficits. Extremities: Symmetric 5 x 5 power. Skin: No rashes, lesions or ulcers Psychiatry: Judgement and insight appear normal. Mood & affect appropriate.     Data Reviewed: I have personally reviewed following labs and imaging studies  CBC: Recent Labs  Lab 08/06/18 1509 08/07/18 0442 08/08/18 0418 08/09/18 0108 08/10/18 0326 08/11/18 0410  WBC 10.2* 9.7 9.4 14.6* 17.1* 15.0*  NEUTROABS 6.9*  --   --   --   --  11.6*  HGB 15.2 14.1 12.4* 12.1* 11.6* 11.2*  HCT 44.3 41.2 37.2* 35.8* 34.2* 33.2*  MCV 92.7 94.3 95.4 93.7 94.2 93.8  PLT 280 259 223 202 191 650   Basic Metabolic Panel: Recent Labs  Lab 08/07/18 0442 08/08/18 0418 08/09/18 0108 08/10/18 0326 08/11/18 0410  NA 135 136 130* 136 137  K 4.7 4.2 3.7 3.5 3.8  CL 102 104 101 106 107  CO2 27 25 24 25 26   GLUCOSE 128* 142* 155* 208* 192*  BUN 21 18 13 15 12   CREATININE 1.29* 1.17 1.04 1.22 1.13  CALCIUM 9.3 8.6* 8.4* 8.2* 8.2*  MG  --  1.3* 1.4*  --   --    GFR: Estimated Creatinine Clearance: 50.7 mL/min (by C-G formula based on SCr of 1.13 mg/dL). Liver Function Tests: Recent Labs  Lab 08/06/18 1509 08/08/18 0418 08/10/18 0326  AST 165* 101* 55*  ALT 122* 86* 55*  ALKPHOS 78 82 89  BILITOT 0.7 0.3 0.4  PROT 10.8* 7.7 7.4  ALBUMIN 3.1* 2.4* 2.2*   No results for input(s): LIPASE, AMYLASE in the last 168 hours. No results for input(s): AMMONIA in the last 168 hours. Coagulation Profile: Recent Labs  Lab 08/08/18 1006  INR 1.17   Cardiac Enzymes: No results for input(s): CKTOTAL, CKMB,  CKMBINDEX, TROPONINI in the last 168 hours. BNP (last 3 results) No results for input(s): PROBNP in the last 8760 hours. HbA1C: No results for input(s): HGBA1C in the last 72 hours. CBG: No results for input(s): GLUCAP in the last 168 hours. Lipid Profile: No results for input(s): CHOL, HDL, LDLCALC, TRIG, CHOLHDL, LDLDIRECT in the last 72 hours. Thyroid Function Tests: No results for input(s): TSH, T4TOTAL, FREET4, T3FREE, THYROIDAB in the last 72 hours. Anemia Panel: No results for input(s): VITAMINB12, FOLATE, FERRITIN, TIBC, IRON, RETICCTPCT in the last 72 hours. Sepsis Labs: Recent Labs  Lab 08/09/18 1909  PROCALCITON 0.34  LATICACIDVEN 1.8  1.9    Recent Results (from the past 240 hour(s))  Culture, blood (routine x 2)     Status: None (Preliminary result)   Collection Time: 08/09/18  1:06 AM  Result Value Ref Range Status   Specimen Description   Final    BLOOD RIGHT FOREARM Performed at [facility], [place] [address], [place], [place] [address]    Special Requests   Final  BOTTLES DRAWN AEROBIC AND ANAEROBIC Blood Culture adequate volume Performed at [facility] [address] [place], [place] [address]    Culture   Final    NO GROWTH 3 DAYS Performed at [facility], [place] [address], [place], [place] [address]    Report Status PENDING  Incomplete  Culture, blood (routine x 2)     Status: None (Preliminary result)   Collection Time: 08/09/18  1:36 AM  Result Value Ref Range Status   Specimen Description   Final    BLOOD PORTA CATH Performed at [facility] [address], [place], [place] [address]    Special Requests   Final    BOTTLES DRAWN AEROBIC AND ANAEROBIC Blood Culture adequate volume Performed at [facility] [address] [place], [place] [address]    Culture   Final    NO GROWTH 3 DAYS Performed at [facility], [place] [address] [place], [place] [address]    Report  Status PENDING  Incomplete  MRSA PCR Screening     Status: None   Collection Time: 08/10/18  6:46 AM  Result Value Ref Range Status   MRSA by PCR NEGATIVE NEGATIVE Final    Comment:        The GeneXpert MRSA Assay (FDA approved for NASAL specimens only), is one component of a comprehensive MRSA colonization surveillance program. It is not intended to diagnose MRSA infection nor to guide or monitor treatment for MRSA infections. Performed at [facility], [place] [address], [place], [place] [address]          Radiology Studies: No results found.      Scheduled Meds: . azelastine  1 spray Each Nare BID  . feeding supplement (ENSURE ENLIVE)  237 mL Oral TID BM  . gabapentin  300 mg Oral TID  . ipratropium-albuterol  3 mL Nebulization TID  . mirabegron ER  25 mg Oral Daily  . montelukast  10 mg Oral QHS  . nicotine  21 mg Transdermal Q24H  . oxyCODONE  10 mg Oral Q12H  . pantoprazole  40 mg Oral Daily  . polyethylene glycol  17 g Oral Daily  . senna-docusate  1 tablet Oral BID   Continuous Infusions: . sodium chloride 75 mL/hr at 08/12/18 0515  . sodium chloride 10 mL/hr at 08/11/18 1543  . piperacillin-tazobactam (ZOSYN)  IV 3.375 g (08/12/18 0827)     LOS: 6 days     Georgette Shell, MD [facility] If 7PM-7AM, please contact night-coverage [email or web address] Password [facility] 08/12/2018, 12:28 PM

## 2018-08-12 NOTE — Progress Notes (Signed)
Per MD she would like patient's oxygen saturation goal to be at least 88%. Patient is currently on 4L [place] with oxygen staying around 94%. Will continue to monitor patient.  Carmela Hurt, RN

## 2018-08-12 NOTE — H&P (View-Only) (Signed)
PULMONARY / CRITICAL CARE MEDICINE  Name: Ryan Houston MRN: [id number] DOB: Apr 14, 1946    ADMISSION DATE:  08/06/2018 CONSULTATION DATE:  08/07/2090  REFERRING MD:  Dr. Alcario Drought  CHIEF COMPLAINT:  Hemoptysis  Brief hospital summary:    72 yo male smoker developed hemoptysis about 2 weeks prior to admission. Admitted 8/7. He has also been getting discomfort in his right chest.  He has noticed some blurred vision and is more hoarse.  He has lost about 30 lbs over the past two months.  CT chest w/ mass in subcarinal area probably invading right main bronchus.  8/8: bronchoscopy:  Right Lung Abnormalities: A partially obstructing (about 70% obstructed)   mass lesion was found proximally in the bronchus intermedius.  Endobronchial biopsies x4 specimens obtained. 8/9:   MRI brain neg for mets. Seen by radiation onc. Porta cath placed.  8/10: spiking fevers.  8/11: spiking fevers still. Started on vanc and zosyn 8/12: Radiation to bronchus today  8/14: Repeat bronch, EBUS + TBNA   SUBJECTIVE: First radiation treatment was yesterday. Denies ongoing hemoptysis.   VITAL SIGNS: BP (!) 141/84 (BP Location: Left Arm)   Pulse 91   Temp 98 F (36.7 C) (Oral)   Resp 18   Ht 5' 9.5" (1.765 m)   Wt 60.7 kg   SpO2 94% Comment: requested Sp02 goal- RN aware  BMI 19.49 kg/m   INTAKE / OUTPUT: I/O last 3 completed shifts: In: 3587.4 [P.O.:360; I.V.:2539.8; IV Piggyback:687.6] Out: 2340 [Urine:2340]  PHYSICAL EXAMINATION: General appearance: 72 y.o., male, NAD, conversant  Eyes: anicteric sclerae, moist conjunctivae; no lid-lag; PERRL, tracking appropriately HENT: NCAT; oropharynx, MMM, Neck: Trachea midline, supple, lymphadenopathy, no JVD Lungs: diminished breath sounds bilaterally, no wheezing  CV: RRR, S1, S2, no MRGs  Abdomen: Soft, non-tender; non-distended Extremities: No peripheral edema or radial and DP pulses present bilaterally  Skin: Normal temperature, turgor and texture; no  rash Psych: Appropriate affect, appropriate mood  Neuro: Alert and oriented to person and place, no focal deficit   LABS:  BMET Recent Labs  Lab 08/09/18 0108 08/10/18 0326 08/11/18 0410  NA 130* 136 137  K 3.7 3.5 3.8  CL 101 106 107  CO2 24 25 26   BUN 13 15 12   CREATININE 1.04 1.22 1.13  GLUCOSE 155* 208* 192*    Electrolytes Recent Labs  Lab 08/08/18 0418 08/09/18 0108 08/10/18 0326 08/11/18 0410  CALCIUM 8.6* 8.4* 8.2* 8.2*  MG 1.3* 1.4*  --   --     CBC Recent Labs  Lab 08/09/18 0108 08/10/18 0326 08/11/18 0410  WBC 14.6* 17.1* 15.0*  HGB 12.1* 11.6* 11.2*  HCT 35.8* 34.2* 33.2*  PLT 202 191 224    Coag's Recent Labs  Lab 08/08/18 1006  INR 1.17    Sepsis Markers Recent Labs  Lab 08/09/18 1909  LATICACIDVEN 1.8  1.9  PROCALCITON 0.34    ABG No results for input(s): PHART, PCO2ART, PO2ART in the last 168 hours.  Liver Enzymes Recent Labs  Lab 08/06/18 1509 08/08/18 0418 08/10/18 0326  AST 165* 101* 55*  ALT 122* 86* 55*  ALKPHOS 78 82 89  BILITOT 0.7 0.3 0.4  ALBUMIN 3.1* 2.4* 2.2*    Cardiac Enzymes No results for input(s): TROPONINI, PROBNP in the last 168 hours.  Glucose No results for input(s): GLUCAP in the last 168 hours.  Imaging CT CHEST - large station 7 node, right sided endobronchial disease, amendable to EBUS, my read The patient's images have been independently  reviewed by me.   STUDIES:  CT chest 8/07 >> subcarinal mass 5.8 x 3.2 cmwith invasion/occlusion RLL bronchus and bronchus intermedius, 16 mm Rt hilar LAN, 11 mm precarinal LAN, b/l calcified pleural plaques, bullous emphysema (reviewed myself) CT abd/pelvis 8/07 >> Lt renal cyst, enlarged prostate, small Rt inguinal hernia (reviewed myself) MRI brain 8/08 >> atrophy and white matter changes  SIGNIFICANT EVENTS: 8/07 Admit, oncology consulted  DISCUSSION: 72 yo male smoker with hemoptysis, hoarseness, weight loss.  Found to have subcarinal mass with  invasion of right lung airways.  Also has pleural plaques.  Main concern is for primary lung cancer.  ASSESSMENT / PLAN:  Right sided endobronchial lesion with enlarged station 7, likely stage III disease by CT imaging -path reported as "suspicious for malignancy". Oncology needing additional histology  Planned EBUS + TBNA for cell block tomorrow morning  Discussed the risks, benefits and alternatives to tissue sampling with repeat bronchoscopy  The patient understands and agrees to move forward with this plan.  This was the first available time in endoscopy  Tolerated 1st radiation treatment yesterday.   Concern for pneumonia, possibly post-obstructive  Continue abx, pip/tazo  Sputum cultures sent today  CXR today   We will plan to send cultures from bronch tomorrow  Emphysema, Current Smoker, likely COPD, no PFTs on file  Outpatient PFTS upon discharge Follow up in the pulmonary clinic with me upon discharge   Pleural plaques. Observe   DVT prophylaxis - SCDs SUP - not indicate Nutrition -regular  Goals of care - full code  Garner Nash, DO [facility] 08/12/2018 9:13 AM  Personal pager: [phone] If unanswered, please page CCM On-call: [phone]

## 2018-08-12 NOTE — Progress Notes (Signed)
Unfortunately, in talking to the pathologist, there just is not enough tissue to make a confirmed diagnosis of bronchogenic carcinoma.  As such, he will need to have another bronchoscopy.  I spoke with Dr. Valeta Harms yesterday.  I really appreciate his input.  It sounds like he will do a bronchoscopy with EBUS today.  Hopefully, this will give [place] the necessary tissue for diagnosis and for molecular studies.  Ryan Houston started radiation yesterday.  He has not had any obvious hemoptysis.  He is eating better.  He has had no nausea or vomiting.  He does state that he has been having some low-grade temperatures.  Hopefully, if he has the bronchoscopy today, we may have some pathology out tomorrow or Thursday at the latest.  I really would like to add chemotherapy to the radiation.  I believe Ryan Houston could handle both treatments.  I think simultaneous chemotherapy/radiation therapy would be much better for him with respect to outcome.  I know that Ryan Houston is getting great care from all the staff up on [address].  Lattie Haw, MD  [place] 12:12

## 2018-08-13 ENCOUNTER — Encounter (HOSPITAL_COMMUNITY)
Admission: EM | Disposition: A | Discharge status: Home / Self Care | Payer: Self-pay | Source: Home / Self Care | Attending: Internal Medicine

## 2018-08-13 ENCOUNTER — Inpatient Hospital Stay (HOSPITAL_COMMUNITY): Payer: Medicare Other

## 2018-08-13 ENCOUNTER — Ambulatory Visit: Payer: Medicare Other

## 2018-08-13 ENCOUNTER — Inpatient Hospital Stay (HOSPITAL_COMMUNITY): Payer: Medicare Other | Admitting: Anesthesiology

## 2018-08-13 ENCOUNTER — Encounter (HOSPITAL_COMMUNITY): Payer: Self-pay | Admitting: *Deleted

## 2018-08-13 ENCOUNTER — Ambulatory Visit
Admit: 2018-08-13 | Discharge: 2018-08-13 | Disposition: A | Discharge status: Home / Self Care | Payer: Medicare Other | Attending: Radiation Oncology | Admitting: Radiation Oncology

## 2018-08-13 DIAGNOSIS — R0902 Hypoxemia: Secondary | ICD-10-CM

## 2018-08-13 DIAGNOSIS — R590 Localized enlarged lymph nodes: Secondary | ICD-10-CM

## 2018-08-13 DIAGNOSIS — R079 Chest pain, unspecified: Secondary | ICD-10-CM

## 2018-08-13 HISTORY — PX: BRONCHIAL WASHINGS: SHX5105

## 2018-08-13 HISTORY — PX: ENDOBRONCHIAL ULTRASOUND: SHX5096

## 2018-08-13 HISTORY — PX: VIDEO BRONCHOSCOPY: SHX5072

## 2018-08-13 HISTORY — PX: FINE NEEDLE ASPIRATION: SHX5430

## 2018-08-13 SURGERY — ENDOBRONCHIAL ULTRASOUND (EBUS)
Anesthesia: General | Laterality: Bilateral

## 2018-08-13 MED ORDER — REMIFENTANIL HCL 1 MG IV SOLR
INTRAVENOUS | Status: DC | PRN
  Administered 2018-08-13: .15 ug/kg/min via INTRAVENOUS

## 2018-08-13 MED ORDER — PROPOFOL 10 MG/ML IV BOLUS
INTRAVENOUS | Status: DC | PRN
  Administered 2018-08-13: 110 mg via INTRAVENOUS

## 2018-08-13 MED ORDER — REMIFENTANIL HCL 1 MG IV SOLR
INTRAVENOUS | Status: AC
  Filled 2018-08-13: qty 1000

## 2018-08-13 MED ORDER — ROCURONIUM BROMIDE 10 MG/ML (PF) SYRINGE
PREFILLED_SYRINGE | INTRAVENOUS | Status: DC | PRN
  Administered 2018-08-13: 40 mg via INTRAVENOUS
  Administered 2018-08-13: 5 mg via INTRAVENOUS

## 2018-08-13 MED ORDER — DEXAMETHASONE SODIUM PHOSPHATE 10 MG/ML IJ SOLN
INTRAMUSCULAR | Status: DC | PRN
  Administered 2018-08-13: 10 mg via INTRAVENOUS

## 2018-08-13 MED ORDER — ONDANSETRON HCL 4 MG/2ML IJ SOLN
INTRAMUSCULAR | Status: DC | PRN
  Administered 2018-08-13: 4 mg via INTRAVENOUS

## 2018-08-13 MED ORDER — PROPOFOL 10 MG/ML IV BOLUS
INTRAVENOUS | Status: AC
  Filled 2018-08-13: qty 20

## 2018-08-13 MED ORDER — PHENYLEPHRINE 40 MCG/ML (10ML) SYRINGE FOR IV PUSH (FOR BLOOD PRESSURE SUPPORT)
PREFILLED_SYRINGE | INTRAVENOUS | Status: DC | PRN
  Administered 2018-08-13 (×2): 80 ug via INTRAVENOUS
  Administered 2018-08-13: 120 ug via INTRAVENOUS

## 2018-08-13 MED ORDER — REMIFENTANIL HCL 1 MG IV SOLR
INTRAVENOUS | Status: DC | PRN
  Administered 2018-08-13: 20 ug via INTRAVENOUS

## 2018-08-13 MED ORDER — LIDOCAINE 2% (20 MG/ML) 5 ML SYRINGE
INTRAMUSCULAR | Status: DC | PRN
  Administered 2018-08-13: 100 mg via INTRAVENOUS

## 2018-08-13 NOTE — Interval H&P Note (Signed)
History and Physical Interval Note:  08/13/2018 10:43 AM  Ryan Houston  has presented today for surgery, with the diagnosis of Lung mass  The various methods of treatment have been discussed with the patient and family. After consideration of risks, benefits and other options for treatment, the patient has consented to  Procedure(s): ENDOBRONCHIAL ULTRASOUND (Bilateral) as a surgical intervention .  The patient's history has been reviewed, patient examined, no change in status, stable for surgery.  I have reviewed the patient's chart and labs.  Questions were answered to the patient's satisfaction.    [facility] 08/13/2018 10:44 AM  Personal pager: [phone] If unanswered, please page CCM On-call: [phone]

## 2018-08-13 NOTE — Progress Notes (Signed)
Pharmacy Antibiotic Note  Ryan Houston is a 72 y.o. male admitted on 08/06/2018 with sepsis.  Pharmacy has been consulted for vancomycin and Zosyn dosing. meds are being started after spiking fevers. Vancomycin subsequently stopped  Plan:  Zosyn 3.375 gr IV q8h extended interval    Dosage will likely remain stable at above dosage  and need for further dosage adjustment appears unlikely at present.    Will sign off at this time.  Please reconsult if a change in clinical status warrants re-evaluation of dosage.     Height: 5' 9.5" (176.5 cm) Weight: 133 lb 14.4 oz (60.7 kg) IBW/kg (Calculated) : 71.85  Temp (24hrs), Avg:98.7 F (37.1 C), Min:97.7 F (36.5 C), Max:100.5 F (38.1 C)  Recent Labs  Lab 08/07/18 0442 08/08/18 0418 08/09/18 0108 08/09/18 1909 08/10/18 0326 08/11/18 0410  WBC 9.7 9.4 14.6*  --  17.1* 15.0*  CREATININE 1.29* 1.17 1.04  --  1.22 1.13  LATICACIDVEN  --   --   --  1.8  1.9  --   --     Estimated Creatinine Clearance: 50.7 mL/min (by C-G formula based on SCr of 1.13 mg/dL).    No Known Allergies  Antimicrobials this admission:  8/11 vancomycin >> 8/12 8/11 Zosyn >>   Dose adjustments this admission: ---  Microbiology results: 8/10 BCx: NGTD 8/11 MRSA PCR: negative    Thank you for allowing pharmacy to be a part of this patient's care.   Royetta Asal, PharmD, BCPS Pager [phone] 08/13/2018 10:19 AM

## 2018-08-13 NOTE — Anesthesia Postprocedure Evaluation (Signed)
Anesthesia Post Note  Patient: Ryan Houston  Procedure(s) Performed: ENDOBRONCHIAL ULTRASOUND (Bilateral ) VIDEO BRONCHOSCOPY BRONCHIAL WASHINGS FINE NEEDLE ASPIRATION (FNA) LINEAR     Patient location during evaluation: Endoscopy Anesthesia Type: General Level of consciousness: awake and alert Pain management: pain level controlled Vital Signs Assessment: post-procedure vital signs reviewed and stable Respiratory status: spontaneous breathing, nonlabored ventilation, respiratory function stable and patient connected to nasal cannula oxygen Cardiovascular status: blood pressure returned to baseline and stable Postop Assessment: no apparent nausea or vomiting Anesthetic complications: no    Last Vitals:  Vitals:   08/13/18 1302 08/13/18 1332  BP:  105/61  Pulse: 99   Resp: 12 12  Temp:  36.9 C  SpO2: 96% 94%    Last Pain:  Vitals:   08/13/18 1332  TempSrc: Oral  PainSc:                  Coral Timme L Jemario Poitras

## 2018-08-13 NOTE — Anesthesia Procedure Notes (Signed)
Procedure Name: Intubation Date/Time: 08/13/2018 11:35 AM Performed by: Sharlette Dense, CRNA Patient Re-evaluated:Patient Re-evaluated prior to induction Oxygen Delivery Method: Circle system utilized Preoxygenation: Pre-oxygenation with 100% oxygen Induction Type: IV induction Ventilation: Mask ventilation without difficulty and Oral airway inserted - appropriate to patient size Laryngoscope Size: Miller and 3 Grade View: Grade I Tube size: 9.0 mm Number of attempts: 1 Airway Equipment and Method: Stylet Placement Confirmation: ETT inserted through vocal cords under direct vision,  positive ETCO2 and breath sounds checked- equal and bilateral Secured at: 21 cm Tube secured with: Tape Dental Injury: Teeth and Oropharynx as per pre-operative assessment

## 2018-08-13 NOTE — Op Note (Signed)
Video Bronchoscopy with Endobronchial Ultrasound Procedure Note  Date of Operation: 08/13/2018  Pre-op Diagnosis: Lung mass, mediastinal adenopathy   Post-op Diagnosis: Lung mass, mediastinal adenopathy   Surgeon: Garner Nash, DO   Assistants: None   Anesthesia: General endotracheal anesthesia  Operation: Flexible video fiberoptic bronchoscopy, Flexible fiberoptic endobronchial ultrasound guided TBNA with biopsies, bronchial washing of the RLL  Estimated Blood Loss: less than 1cc   Complications: None   Indications and History: GERADO NABERS is a 72 y.o. male with right bronchus intermedius endobronchial lesion with mediastinal adenopathy.  The risks, benefits, complications, treatment options and expected outcomes were discussed with the patient.  The possibilities of pneumothorax, pneumonia, reaction to medication, pulmonary aspiration, bleeding, failure to diagnose a condition and creating a complication requiring transfusion or operation were discussed with the patient who freely signed the consent.    Description of Procedure: The patient was examined in the preoperative area and history and data from the preprocedure consultation were reviewed. It was deemed appropriate to proceed.  The patient was taken to ENDO RM1 at [facility], identified as [facility]are and the procedure verified as Flexible Video Fiberoptic Bronchoscopy.  A Time Out was held and the above information confirmed. After being taken to the endoscopy room general anesthesia was initiated and the patient  was orally intubated per anesthesia record. The video fiberoptic bronchoscope was introduced via the endotracheal tube and a general inspection was performed which showed invasion of tumor into the bronchus intermedius with near total occlusion. The remaining airways revealed scattered bronchial pitting and early bronchiectatic take offs with submucosal anthracotic pigmentation. We instilled 60 cc of saline into the  bronchus intermedius and the fluid was suction into a luki-trap to be sent for culture and cyology. The standard scope was then withdrawn and the endobronchial ultrasound was used to identify and characterize the peritracheal, hilar and bronchial lymph nodes. Inspection showed an enlarged station 7. Using real-time ultrasound guidance needle biopsies were take from Station 7 nodes and were sent for cytology. The patient tolerated the procedure well without apparent complications. There was no significant blood loss. The bronchoscope was withdrawn. Anesthesia was reversed and the patient was taken to the PACU for recovery.   Samples: 1. Bronchial washing of the bronchus intermedius and RLL with 60 cc of saline and moderate return to be sent for culture and cytology   2. TBNA Station 7, X6, 3 for slides and 3 for cell block   Preliminary pathology: station + for metastatic carcinoma. Final pathology pending.   Plans:  The patient will be discharged from the PACU to home when recovered from anesthesia. We will review the cytology, pathology and microbiology results with the patient when they become available. Outpatient followup will be with me in the office in 2 weeks.   Garner Nash, DO [facility] 08/13/2018 12:36 PM  Personal pager: [phone]

## 2018-08-13 NOTE — Transfer of Care (Signed)
Immediate Anesthesia Transfer of Care Note  Patient: Ryan Houston  Procedure(s) Performed: ENDOBRONCHIAL ULTRASOUND (Bilateral ) VIDEO BRONCHOSCOPY BRONCHIAL WASHINGS FINE NEEDLE ASPIRATION (FNA) LINEAR  Patient Location: Endoscopy Unit  Anesthesia Type:General  Level of Consciousness: awake and alert   Airway & Oxygen Therapy: Patient Spontanous Breathing and Patient connected to face mask oxygen  Post-op Assessment: Report given to RN and Post -op Vital signs reviewed and stable  Post vital signs: Reviewed and stable  Last Vitals:  Vitals Value Taken Time  BP    Temp    Pulse 106 08/13/2018 12:39 PM  Resp 23 08/13/2018 12:39 PM  SpO2 95 % 08/13/2018 12:39 PM  Vitals shown include unvalidated device data.  Last Pain:  Vitals:   08/13/18 1035  TempSrc: Oral  PainSc: 8       Patients Stated Pain Goal: 2 (48/27/07 8675)  Complications: No apparent anesthesia complications

## 2018-08-13 NOTE — Progress Notes (Signed)
PROGRESS NOTE    Ryan Houston  MEQ:[id number] DOB: 06-Jun-1946 DOA: 08/06/2018 PCP: Rogers Blocker, MD  Brief Narrative:72 y.o.malewith medical history significant ofHCV,tobacco use disorder who presented with 1 month of hemoptysis and recent unintentional weight loss. Recently diagnosed with lung mass at a local ED.CT chest with contrast done on 08/06/2018 revealed 5.8 x 3.2 cm subcarinal and medial right lower lobe lung mass invading and occluding the right lower lobe bronchus and bronchus intermedius. PCCM consulted for biopsies and radiation oncology for symptoms relief. Tissue pathology with suspicion for malignancy.  Hospital course complicated by intermittent fevers,moderate to severe back pain. Started on broad-spectrum IV antibiotics empirically. Blood cultures x2- to date. Pain medications adjusted. Plan for inpatient EBUS and TBNA for cell block by PCCM.  08/11/2018: Patient seen and examined at his bedside. He has no new complaints. His pain is well controlled on current management.Denies dyspnea at rest.   Assessment & Plan:   Principal Problem:   Lung mass Active Problems:   Chronic hepatitis C without hepatic coma (HCC)   Hemoptysis   Hypercalcemia of malignancy   Malnutrition of moderate degree   Malignant neoplasm of lung (HCC)   Fever Persistent hemoptysis secondary to right lower lobe mass invading right lower bronchus and bronchus intermedius Status post bronchoscopy with biopsies obtained POD #6 Tissue pathology done on 08/07/2018 suspicious for malignancy Repeat bronc 08/13/2018 obtained bronchial washing of the bronchus intermedius and right lower lobe sent for culture and cytology.  Status post transbronchial needle aspiration. preliminary pathology metastatic carcinoma final pathology pending.  Dr. Lisbeth Renshaw radiation oncology has been consulted and following.  Oncology and pulmonology following. Port-A-Cath placed in on 08/08/2018  Suspected  sepsis with unclear etiology Heart rate 110s,WBC 17 K yesterday Leukocytosis is trending down Procalcitonin 0.34on 08/10/2018 ContinueIV Zosyn empirically MRSA screening negative,DC IV vancomycin UA negative  Chest x-ray independently reviewed did not show any lobular infiltrates that would be indicative of pneumonia Blood cultures x2negative to date   Suspected COPD with bullous emphysema PCCM following Possible PFTs outpatient  Chronic tobacco use disorder Continue nicotine patch Tobacco cessation counseling done at bedside  Resolved headaches If persistent or migraine may start sumatriptan As needed Tylenol Avoid using more than 2 g due to history of hepatitis C  Severeprotein calorie malnutrition Albumin 2.2and BMI 19 Encourage increasing protein calorie intake Continue oral supplement  History of hepatitis C Per records it appears that the patient was treated at some point in 2017 through the infectious disease hepatitis C clinic and may not have followed up Follow-up with ID outpatient for treatment of hepatitis C   DVT prophylaxis: Lovenox Code Status: Full code Family Communication: None Disposition Plan: DC home when okay with pulmonology and oncology  Consultants: PCCM, radiation oncology, medical oncology   Procedures: Bronc 08/07/2018 repeat bronc 08/13/2018  Antimicrobials: Zosyn  Subjective: Sitting up by the side of the bed in no acute distress main complaint is dyspnea on exertion no further hemoptysis.  Objective: Vitals:   08/12/18 2350 08/13/18 0532 08/13/18 0804 08/13/18 1035  BP:  108/61  (!) 145/83  Pulse:  78 81 86  Resp:  18 18 12   Temp: 98.6 F (37 C) 98.1 F (36.7 C)  97.6 F (36.4 C)  TempSrc: Oral Oral  Oral  SpO2:  96% 97% 93%  Weight:    60.7 kg  Height:    5' 9.5" (1.765 m)    Intake/Output Summary (Last 24 hours) at 08/13/2018 1212 Last data filed  at 08/13/2018 0900 Gross per 24 hour  Intake 0 ml  Output 700 ml    Net -700 ml   Filed Weights   08/06/18 1646 08/07/18 0225 08/13/18 1035  Weight: 59.9 kg 60.7 kg 60.7 kg    Examination:  General exam: Appears calm and comfortable  Respiratory system: Scattered rhonchi both lung fields auscultation. Respiratory effort normal. Cardiovascular system: S1 & S2 heard, RRR. No JVD, murmurs, rubs, gallops or clicks. No pedal edema. Gastrointestinal system: Abdomen is nondistended, soft and nontender. No organomegaly or masses felt. Normal bowel sounds heard. Central nervous system: Alert and oriented. No focal neurological deficits. Extremities: Symmetric 5 x 5 power. Skin: No rashes, lesions or ulcers Psychiatry: Judgement and insight appear normal. Mood & affect appropriate.     Data Reviewed: I have personally reviewed following labs and imaging studies  CBC: Recent Labs  Lab 08/06/18 1509 08/07/18 0442 08/08/18 0418 08/09/18 0108 08/10/18 0326 08/11/18 0410  WBC 10.2* 9.7 9.4 14.6* 17.1* 15.0*  NEUTROABS 6.9*  --   --   --   --  11.6*  HGB 15.2 14.1 12.4* 12.1* 11.6* 11.2*  HCT 44.3 41.2 37.2* 35.8* 34.2* 33.2*  MCV 92.7 94.3 95.4 93.7 94.2 93.8  PLT 280 259 223 202 191 102   Basic Metabolic Panel: Recent Labs  Lab 08/07/18 0442 08/08/18 0418 08/09/18 0108 08/10/18 0326 08/11/18 0410  NA 135 136 130* 136 137  K 4.7 4.2 3.7 3.5 3.8  CL 102 104 101 106 107  CO2 27 25 24 25 26   GLUCOSE 128* 142* 155* 208* 192*  BUN 21 18 13 15 12   CREATININE 1.29* 1.17 1.04 1.22 1.13  CALCIUM 9.3 8.6* 8.4* 8.2* 8.2*  MG  --  1.3* 1.4*  --   --    GFR: Estimated Creatinine Clearance: 50.7 mL/min (by C-G formula based on SCr of 1.13 mg/dL). Liver Function Tests: Recent Labs  Lab 08/06/18 1509 08/08/18 0418 08/10/18 0326  AST 165* 101* 55*  ALT 122* 86* 55*  ALKPHOS 78 82 89  BILITOT 0.7 0.3 0.4  PROT 10.8* 7.7 7.4  ALBUMIN 3.1* 2.4* 2.2*   No results for input(s): LIPASE, AMYLASE in the last 168 hours. No results for input(s):  AMMONIA in the last 168 hours. Coagulation Profile: Recent Labs  Lab 08/08/18 1006  INR 1.17   Cardiac Enzymes: No results for input(s): CKTOTAL, CKMB, CKMBINDEX, TROPONINI in the last 168 hours. BNP (last 3 results) No results for input(s): PROBNP in the last 8760 hours. HbA1C: No results for input(s): HGBA1C in the last 72 hours. CBG: No results for input(s): GLUCAP in the last 168 hours. Lipid Profile: No results for input(s): CHOL, HDL, LDLCALC, TRIG, CHOLHDL, LDLDIRECT in the last 72 hours. Thyroid Function Tests: No results for input(s): TSH, T4TOTAL, FREET4, T3FREE, THYROIDAB in the last 72 hours. Anemia Panel: No results for input(s): VITAMINB12, FOLATE, FERRITIN, TIBC, IRON, RETICCTPCT in the last 72 hours. Sepsis Labs: Recent Labs  Lab 08/09/18 1909  PROCALCITON 0.34  LATICACIDVEN 1.8  1.9    Recent Results (from the past 240 hour(s))  Culture, blood (routine x 2)     Status: None (Preliminary result)   Collection Time: 08/09/18  1:06 AM  Result Value Ref Range Status   Specimen Description   Final    BLOOD RIGHT FOREARM Performed at [facility], [place] [address] [place], [place] [address]    Special Requests   Final    BOTTLES DRAWN AEROBIC  AND ANAEROBIC Blood Culture adequate volume Performed at [facility] [address], [place], [place] [address]    Culture   Final    NO GROWTH 3 DAYS Performed at [facility], [place] [address], [place], [place] [address]    Report Status PENDING  Incomplete  Culture, blood (routine x 2)     Status: None (Preliminary result)   Collection Time: 08/09/18  1:36 AM  Result Value Ref Range Status   Specimen Description   Final    BLOOD PORTA CATH Performed at [facility] [address], [place], [place] [address]    Special Requests   Final    BOTTLES DRAWN AEROBIC AND ANAEROBIC Blood Culture adequate volume Performed at [facility]  [address], [place], [place] [address]    Culture   Final    NO GROWTH 3 DAYS Performed at [facility], [place] [address] [place], [place] [address]    Report Status PENDING  Incomplete  MRSA PCR Screening     Status: None   Collection Time: 08/10/18  6:46 AM  Result Value Ref Range Status   MRSA by PCR NEGATIVE NEGATIVE Final    Comment:        The GeneXpert MRSA Assay (FDA approved for NASAL specimens only), is one component of a comprehensive MRSA colonization surveillance program. It is not intended to diagnose MRSA infection nor to guide or monitor treatment for MRSA infections. Performed at [facility], [place] [address], [place], [place] [address]          Radiology Studies: Dg Chest Port 1 View  Result Date: 08/12/2018 CLINICAL DATA:  Right chest discomfort in patient with a known chest mass. Hemoptysis. EXAM: PORTABLE CHEST 1 VIEW COMPARISON:  Single-view of the chest 08/09/2018. CT chest, abdomen and pelvis 08/06/2018. FINDINGS: Right Port-A-Cath is again seen. The lungs are emphysematous. Small right pleural effusion is new since the most recent exam. Right basilar airspace disease is again seen. Calcified pleural plaques are seen as on the prior studies. Heart size is normal. Aortic atherosclerosis is noted. No acute bony abnormality. IMPRESSION: Small right pleural effusion is new since the most recent examination. Right basilar airspace disease is unchanged. Emphysema. Atherosclerosis. Electronically Signed   By: Inge Rise M.D.   On: 08/12/2018 12:54        Scheduled Meds: . [MAR Hold] azelastine  1 spray Each Nare BID  . [MAR Hold] feeding supplement (ENSURE ENLIVE)  237 mL Oral TID BM  . [MAR Hold] gabapentin  300 mg Oral TID  . [MAR Hold] ipratropium-albuterol  3 mL Nebulization TID  . [MAR Hold] mirabegron ER  25 mg Oral Daily  . [MAR Hold] montelukast  10 mg Oral QHS  . [MAR Hold] nicotine  21 mg Transdermal Q24H  . [MAR  Hold] oxyCODONE  10 mg Oral Q12H  . [MAR Hold] pantoprazole  40 mg Oral Daily  . [MAR Hold] polyethylene glycol  17 g Oral Daily  . [MAR Hold] senna-docusate  1 tablet Oral BID   Continuous Infusions: . sodium chloride 75 mL/hr at 08/13/18 0704  . sodium chloride 10 mL/hr at 08/13/18 0935  . [MAR Hold] piperacillin-tazobactam (ZOSYN)  IV 3.375 g (08/13/18 0936)     LOS: 7 days     Georgette Shell, MD [facility]  If 7PM-7AM, please contact night-coverage [email or web address] Password [email or web address] 08/13/2018, 12:12 PM

## 2018-08-13 NOTE — Progress Notes (Signed)
Mr. Hewes is going for bronchoscopy today.  He is doing all right.  He has had no hemoptysis.  He does state that his temperature goes up a little bit every afternoon.  I do not see that has any obvious infection.  It is possible that this is all malignant related.  He has decent appetite.  He has had no nausea or vomiting.  He has some lab work ordered for tomorrow.  He is had a 2 radiation treatments.  He is doing well with these so far.  There is been no change in bowel or bladder habits.  He has had no diarrhea.  His temperature maxxed out at 102.1 yesterday.  Currently, it is 98.1.  Blood pressure is 108/61.  His lungs sound pretty clear bilaterally.  There may be some occasional expiratory wheezes.  He has good air movement bilaterally.  Cardiac exam regular rate and rhythm with no murmurs, rubs or bruits.  Abdomen is soft.  He has good bowel sounds.  There is no fluid wave.  There is no palpable liver or spleen tip.  Extremities shows no clubbing, cyanosis or edema.  Again, we really need to see what the repeat bronchoscopy shows.  Hopefully, we will be able to have enough tissue to make a firm diagnosis.  He will continue his radiation therapy.  Lattie Haw, MD  1 Thessalonians 5:16-18

## 2018-08-13 NOTE — Progress Notes (Signed)
Pt sats decreased to 85 %, pt on RA, pt removed oxygen, and talking on phone, mad pt aware pt alert and oriented, and have asked pt to replace O2, O2 now at 88%, oxygen is on 4l and pt remain on the phone, will monitor and check pt again. SRP, RN

## 2018-08-13 NOTE — Anesthesia Preprocedure Evaluation (Addendum)
Anesthesia Evaluation  Patient identified by MRN, date of birth, ID band Patient awake    Reviewed: Allergy & Precautions, NPO status , Patient's Chart, lab work & pertinent test results  History of Anesthesia Complications Negative for: history of anesthetic complications  Airway Mallampati: I  TM Distance: >3 FB Neck ROM: Full    Dental  (+) Edentulous Upper, Dental Advisory Given,    Pulmonary COPD,  COPD inhaler, Current Smoker,  Lung mass, mediastinal mass. Presented to ED with 1 month of hemoptysis   breath sounds clear to auscultation       Cardiovascular hypertension,  Rhythm:Regular Rate:Normal     Neuro/Psych negative neurological ROS  negative psych ROS   GI/Hepatic GERD  Medicated and Controlled,(+)     substance abuse  alcohol use and marijuana use, Hepatitis -, C  Endo/Other  negative endocrine ROS  Renal/GU negative Renal ROS  negative genitourinary   Musculoskeletal  (+) Arthritis , Osteoarthritis,    Abdominal   Peds negative pediatric ROS (+)  Hematology negative hematology ROS (+)   Anesthesia Other Findings   Reproductive/Obstetrics negative OB ROS                            Anesthesia Physical Anesthesia Plan  ASA: III  Anesthesia Plan: General   Post-op Pain Management:    Induction: Intravenous  PONV Risk Score and Plan: 1 and Dexamethasone and Ondansetron  Airway Management Planned: Oral ETT  Additional Equipment:   Intra-op Plan:   Post-operative Plan: Extubation in OR  Informed Consent: I have reviewed the patients History and Physical, chart, labs and discussed the procedure including the risks, benefits and alternatives for the proposed anesthesia with the patient or authorized representative who has indicated his/her understanding and acceptance.   Dental advisory given  Plan Discussed with: CRNA  Anesthesia Plan Comments:          Anesthesia Quick Evaluation

## 2018-08-14 ENCOUNTER — Ambulatory Visit: Payer: Medicare Other

## 2018-08-14 ENCOUNTER — Ambulatory Visit
Admit: 2018-08-14 | Discharge: 2018-08-14 | Disposition: A | Discharge status: Home / Self Care | Payer: Medicare Other | Attending: Radiation Oncology | Admitting: Radiation Oncology

## 2018-08-14 DIAGNOSIS — J9859 Other diseases of mediastinum, not elsewhere classified: Secondary | ICD-10-CM

## 2018-08-14 LAB — BASIC METABOLIC PANEL
ANION GAP: 8 (ref 5–15)
BUN: 19 mg/dL (ref 8–23)
CHLORIDE: 102 mmol/L (ref 98–111)
CO2: 26 mmol/L (ref 22–32)
CREATININE: 1.05 mg/dL (ref 0.61–1.24)
Calcium: 8.2 mg/dL — ABNORMAL LOW (ref 8.9–10.3)
GFR calc non Af Amer: >60 mL/min (ref >60)
Glucose, Bld: 301 mg/dL — ABNORMAL HIGH (ref 70–99)
Potassium: 4.3 mmol/L (ref 3.5–5.1)
SODIUM: 136 mmol/L (ref 135–145)

## 2018-08-14 LAB — CULTURE, BLOOD (ROUTINE X 2)
Culture: NO GROWTH
Culture: NO GROWTH
Special Requests: ADEQUATE
Special Requests: ADEQUATE

## 2018-08-14 LAB — CBC
HEMATOCRIT: 31.3 % — AB (ref 39.0–52.0)
HEMOGLOBIN: 10.4 g/dL — AB (ref 13.0–17.0)
MCH: 31.6 pg (ref 26.0–34.0)
MCHC: 33.2 g/dL (ref 30.0–36.0)
MCV: 95.1 fL (ref 78.0–100.0)
Platelets: 252 10*3/uL (ref 150–400)
RBC: 3.29 MIL/uL — AB (ref 4.22–5.81)
RDW: 13.1 % (ref 11.5–15.5)
WBC: 12.9 10*3/uL — AB (ref 4.0–10.5)

## 2018-08-14 MED ORDER — ONDANSETRON HCL 4 MG PO TABS: 4.0000 mg | ORAL_TABLET | Freq: Four times a day (QID) | ORAL | 0 refills | Status: DC | PRN

## 2018-08-14 MED ORDER — HEPARIN SOD (PORK) LOCK FLUSH 100 UNIT/ML IV SOLN
500.0000 [IU] | INTRAVENOUS | Status: AC | PRN
  Administered 2018-08-14: 500 [IU]

## 2018-08-14 MED ORDER — ENSURE ENLIVE PO LIQD: 237.0000 mL | Freq: Three times a day (TID) | ORAL | 12 refills | Status: DC

## 2018-08-14 MED ORDER — POLYETHYLENE GLYCOL 3350 17 G PO PACK: 17.0000 g | PACK | Freq: Every day | ORAL | 0 refills | Status: DC

## 2018-08-14 MED ORDER — SENNOSIDES-DOCUSATE SODIUM 8.6-50 MG PO TABS: 1.0000 | ORAL_TABLET | Freq: Two times a day (BID) | ORAL | Status: DC

## 2018-08-14 MED ORDER — OXYCODONE ER 9 MG PO C12A: 9.0000 mg | EXTENDED_RELEASE_CAPSULE | Freq: Two times a day (BID) | ORAL | 0 refills | Status: DC

## 2018-08-14 MED ORDER — IPRATROPIUM-ALBUTEROL 0.5-2.5 (3) MG/3ML IN SOLN: 3.0000 mL | Freq: Three times a day (TID) | RESPIRATORY_TRACT | 2 refills | Status: DC

## 2018-08-14 MED ORDER — NICOTINE 21 MG/24HR TD PT24: 21.0000 mg | MEDICATED_PATCH | TRANSDERMAL | 0 refills | Status: DC

## 2018-08-14 MED ORDER — AMOXICILLIN-POT CLAVULANATE 875-125 MG PO TABS: 1.0000 | ORAL_TABLET | Freq: Two times a day (BID) | ORAL | 0 refills | Status: DC

## 2018-08-14 MED ORDER — IPRATROPIUM-ALBUTEROL 0.5-2.5 (3) MG/3ML IN SOLN: 3.0000 mL | RESPIRATORY_TRACT | 2 refills | Status: DC | PRN

## 2018-08-14 MED ORDER — MIRABEGRON ER 25 MG PO TB24: 25.0000 mg | ORAL_TABLET | Freq: Every day | ORAL | 0 refills | Status: DC

## 2018-08-14 MED ORDER — SONAFINE EX EMUL
1.0000 "application " | Freq: Once | CUTANEOUS | Status: AC
  Administered 2018-08-14: 1 via TOPICAL

## 2018-08-14 NOTE — Progress Notes (Signed)
Nutrition Follow-up  DOCUMENTATION CODES:   Non-severe (moderate) malnutrition in context of acute illness/injury  INTERVENTION:    Ensure Enlive po TID, each supplement provides 350 kcal and 20 grams of protein  Magic cup TID with meals, each supplement provides 290 kcal and 9 grams of protein  NUTRITION DIAGNOSIS:   Moderate Malnutrition related to acute illness(new lung mass) as evidenced by severe muscle depletion, moderate fat depletion, moderate muscle depletion.  Ongoing  GOAL:   Patient will meet greater than or equal to 90% of their needs  Progressing  MONITOR:   PO intake, Supplement acceptance, Weight trends, Labs, Diet advancement  REASON FOR ASSESSMENT:   Consult Assessment of nutrition requirement/status  ASSESSMENT:   Patient with PMH significant for arthritis, GERD, and HTN. Presents this admission with with complaints of hemoptysis and discomfort in his right chest. CT scan revealed large subcarinal mass that is believed to be bronchogenic carcinoma. Plan for bronchoscopy 8/9.   8/9 - s/p Port-a-cath placement 8/12- radiation treatment  Meal completions charted as 95-100% for his last 6 meals. Pt describes having a great appetite. Denies any issues with swallowing, nausea, or vomiting. Pt reports consuming all of his oral nutrition supplements. RD observed breakfast tray with eggs, sausage, and pancakes. Pt was very eager to begin eating.   Pt keeps inquiring about a "free Ensure" program. Spoke with case management who reports there are no programs here. If pt is seen by [facility], he may be able to obtain supplement samples and coupons. Also, this RD reassured pt that he could purchase supplements with EBT.   Weight noted to remain stable. Pt to d/c home today.   Medications reviewed and include: NS @ 75 ml/hr Labs reviewed: CBG 155-301 (H)   Diet Order:   Diet Order            Diet - low sodium heart healthy        Diet regular Room  service appropriate? Yes; Fluid consistency: Thin  Diet effective now              EDUCATION NEEDS:   Education needs have been addressed  Skin:  Skin Assessment: Reviewed RN Assessment  Last BM:  08/13/18  Height:   Ht Readings from Last 1 Encounters:  08/13/18 5' 9.5" (1.765 m)    Weight:   Wt Readings from Last 1 Encounters:  08/13/18 60.7 kg    Ideal Body Weight:  75.5 kg  BMI:  Body mass index is 19.49 kg/m.  Estimated Nutritional Needs:   Kcal:  2150-2350 kcal  Protein:  105-120 grams   Fluid:  >/= 2.1 L/day   Mariana Single RD, LDN Clinical Nutrition Pager # - [phone]

## 2018-08-14 NOTE — Plan of Care (Signed)
  Problem: Health Behavior/Discharge Planning: Goal: Ability to manage health-related needs will improve Outcome: Progressing   Problem: Clinical Measurements: Goal: Ability to maintain clinical measurements within normal limits will improve Outcome: Progressing Goal: Will remain free from infection Outcome: Progressing Goal: Diagnostic test results will improve Outcome: Progressing Goal: Respiratory complications will improve Outcome: Progressing Goal: Cardiovascular complication will be avoided Outcome: Progressing   Problem: Activity: Goal: Risk for activity intolerance will decrease Outcome: Progressing   Problem: Coping: Goal: Level of anxiety will decrease Outcome: Progressing   Problem: Pain Managment: Goal: General experience of comfort will improve Outcome: Progressing   Problem: Skin Integrity: Goal: Risk for impaired skin integrity will decrease Outcome: Progressing   Problem: Nutrition Goal: Patient maintains adequate hydration Outcome: Progressing Goal: Patient maintains weight Outcome: Progressing Goal: Patient/Family demonstrates understanding of diet Outcome: Progressing Goal: Patient will have no more than 5 lb weight change during LOS Outcome: Progressing

## 2018-08-14 NOTE — Progress Notes (Signed)
Pt's O2 Sats at 87% on room air at rest.

## 2018-08-14 NOTE — Progress Notes (Signed)
Pt here for patient teaching.  Pt given Radiation and You booklet, skin care instructions and Sonafine.  Reviewed areas of pertinence such as fatigue, hair loss, skin changes and throat changes . Pt able to give teach back of to pat skin,apply Sonafine bid and avoid applying anything to skin within 4 hours of treatment. Pt verbalizes understanding of information given and will contact nursing with any questions or concerns.    Ryan Houston. Leonie Green, BSN

## 2018-08-14 NOTE — Progress Notes (Signed)
Patient was wearing oxygen at 3L/[place] while sitting patient's O2 sat was 97%, while standing 100% and while walking was 100%. While sitting with no O2 the patient 82% and standing 78% so elected not to walk patient while no oxygen in place. Informed patient and case management.

## 2018-08-14 NOTE — Progress Notes (Signed)
Discharge instructions (including medications) discussed with and copy provided to patient/caregiver 

## 2018-08-14 NOTE — Progress Notes (Signed)
Inpatient Diabetes Program Recommendations  AACE/ADA: New Consensus Statement on Inpatient Glycemic Control (2015)  Target Ranges:  Prepandial:   less than 140 mg/dL      Peak postprandial:   less than 180 mg/dL (1-2 hours)      Critically ill patients:  140 - 180 mg/dL   Results for BION, TODOROV (MRN [id number]) as of 08/14/2018 09:06  Ref. Range 08/07/2018 04:42 08/08/2018 04:18 08/09/2018 01:08 08/10/2018 03:26 08/11/2018 04:10 08/14/2018 04:19  Glucose Latest Ref Range: 70 - 99 mg/dL 128 (H) 142 (H) 155 (H) 208 (H) 192 (H) 301 (H)   Review of Glycemic Control  Diabetes history: None Current orders for Inpatient glycemic control: None  Inpatient Diabetes Program Recommendations:    Decadron 10 mg given yesterday. Lab glucose 301 this am. Consider CBGs and possibly Novolog 0-9 units tid + Novolog HS scale while here.  May consider an A1c.  Thanks,  Tama Headings RN, MSN, BC-ADM Inpatient Diabetes Coordinator Team Pager [phone] (8a-5p)

## 2018-08-14 NOTE — Progress Notes (Signed)
Ryan Houston had his bronchoscopy and EBUS yesterday.  Dr. Valeta Harms did a phenomenal job.  Looks like there is a preliminary path report that is positive for cancer.  Hopefully, we will have confirmed results today or tomorrow the latest.  He has had some hypoxia overnight.  He is on some supplemental oxygen.  He is bringing up a lot of thick grayish secretions.  I do not see any obvious blood.  He is getting radiation treatments.  He may want to keep him for his first cycle of chemotherapy while in the hospital.  I think this would make life a lot easier for Ryan Houston.  Again, we have to see what the pathology is.  Chemotherapy would be different for squamous cell cancer as it would be for adenocarcinoma.  His labs look okay.  His white cell count 12.9.  Hemoglobin 10.4.  Platelet count 252,000.  His creatinine is 1.05.  His appetite is doing okay.  He has had no nausea or vomiting.  He is going to the bathroom okay.  We have to await the path results now.  Outside of that, everything else is all set up for chemotherapy.  He is getting radiation therapy.  He has had 2 treatments so far.  I have very much appreciate the fantastic care that is getting from everybody up on the floor on 4 W.  Nurses are doing a tremendous job.   Ryan Haw, MD  Ryan Houston 6:10

## 2018-08-14 NOTE — Discharge Summary (Addendum)
Physician Discharge Summary  Ryan Houston AYT:[id number] DOB: 1946/02/27 DOA: 08/06/2018  PCP: Rogers Blocker, MD  Admit date: 08/06/2018 Discharge date: 08/14/2018  Admitted From: Home Disposition: Home Recommendations for Outpatient Follow-up:  1. Follow up with PCP in 1-2 weeks 2. Please obtain BMP/CBC in one week 3. Please follow up on the following pending results labs results from the bronc 4. Follow-up with pulmonologist Dr.icard 5. Follow-up with Dr. Marin Olp 6. Up with Dr. Alton Revere to start treatment for hepatitis C  Home Health: None Equipment/Devices none Discharge Condition: Stable CODE STATUS full code Diet recommendation: Cardiac diet Brief/Interim Summary:72 y.o.malewith medical history significant ofHCV,tobacco use disorder who presented with 1 month of hemoptysis and recent unintentional weight loss. Recently diagnosed with lung mass at a local ED.CT chest with contrast done on 08/06/2018 revealed 5.8 x 3.2 cm subcarinal and medial right lower lobe lung mass invading and occluding the right lower lobe bronchus and bronchus intermedius. PCCM consulted for biopsies and radiation oncology for symptoms relief. Tissue pathology with suspicion for malignancy.  Hospital course complicated by intermittent fevers,moderate to severe back pain. Started on broad-spectrum IV antibiotics empirically. Blood cultures x2- to date. Pain medications adjusted. Plan for inpatient EBUS and TBNA for cell block by PCCM.  08/11/2018: Patient seen and examined at his bedside. He has no new complaints. His pain is well controlled on current management.Denies dyspnea at rest.  Discharge Diagnoses:  Principal Problem:   Lung mass Active Problems:   Chronic hepatitis C without hepatic coma (HCC)   Hemoptysis   Hypercalcemia of malignancy   Malnutrition of moderate degree   Malignant neoplasm of lung (HCC)   Fever   Mediastinal adenopathy  Persistent hemoptysis secondary to  right lower lobe mass invading right lower bronchus and bronchus intermedius Status post bronchoscopy with biopsies obtained POD #6 Tissue pathology done on 08/07/2018 suspicious for malignancy Repeat bronc 08/13/2018 obtained bronchial washing of the bronchus intermedius and right lower lobe sent for culture and cytology.  Status post transbronchial needle aspiration. preliminary pathology metastatic carcinoma final pathology pending.  Will arrange for oxygen 2 L at home along with nebulizer.  Dr. Lisbeth Renshaw radiation oncology has been consulted and patient is getting radiation daily and he will continue to follow-up with radiation oncology as an outpatient.  Transportation has been arranged. Port-A-Cath placed in on 08/08/2018  Suspected sepsis with unclear etiology-patient was treated with Zosyn during the hospital stay we will discharge him on Augmentin for 5 days.  Cultures negative.   Suspected COPD with bullous emphysema  PFTs outpatient Chronic tobacco use disorder Continue nicotine patch Tobacco cessation counseling done at bedside  Severeprotein calorie malnutrition Albumin 2.2and BMI 19 Encourage increasing protein calorie intake Continue oral supplement  History of hepatitis C Per records it appears that the patient was treated at some point in 2017 through the infectious disease hepatitis C clinic and may not have followed up Follow-up with ID outpatient for treatment of hepatitis C Dr. Linus Salmons upon discharge for continuation of starting treatment of hepatitis C.  Discharge Instructions  Discharge Instructions    Call MD for:  difficulty breathing, headache or visual disturbances   Complete by:  As directed    Call MD for:  persistant dizziness or light-headedness   Complete by:  As directed    Call MD for:  persistant nausea and vomiting   Complete by:  As directed    Call MD for:  severe uncontrolled pain   Complete by:  As directed  Call MD for:  temperature  >100.4   Complete by:  As directed    Diet - low sodium heart healthy   Complete by:  As directed    Increase activity slowly   Complete by:  As directed      Allergies as of 08/14/2018   No Known Allergies     Medication List    STOP taking these medications   ibuprofen 800 MG tablet Commonly known as:  ADVIL,MOTRIN   Sofosbuvir-Velpatasvir 400-100 MG Tabs     TAKE these medications   albuterol 108 (90 Base) MCG/ACT inhaler Commonly known as:  PROVENTIL HFA;VENTOLIN HFA Inhale 1-2 puffs into the lungs every 6 (six) hours as needed for wheezing.   amoxicillin-clavulanate 875-125 MG tablet Commonly known as:  AUGMENTIN Take 1 tablet by mouth 2 (two) times daily for 10 days.   azelastine 0.1 % nasal spray Commonly known as:  ASTELIN Place into both nostrils 2 (two) times daily. Use in each nostril as directed   feeding supplement (ENSURE ENLIVE) Liqd Take 237 mLs by mouth 3 (three) times daily between meals.   FISH OIL PO Take 1 capsule by mouth every other day.   gabapentin 300 MG capsule Commonly known as:  NEURONTIN Take 300 mg by mouth 3 (three) times daily.   hydrOXYzine 25 MG tablet Commonly known as:  ATARAX/VISTARIL Take 1 tablet (25 mg total) by mouth every 6 (six) hours.   ipratropium-albuterol 0.5-2.5 (3) MG/3ML Soln Commonly known as:  DUONEB Take 3 mLs by nebulization 3 (three) times daily.   ipratropium-albuterol 0.5-2.5 (3) MG/3ML Soln Commonly known as:  DUONEB Inhale 3 mLs into the lungs every 4 (four) hours as needed.   mirabegron ER 25 MG Tb24 tablet Commonly known as:  MYRBETRIQ Take 1 tablet (25 mg total) by mouth daily. Start taking on:  08/15/2018   montelukast 10 MG tablet Commonly known as:  SINGULAIR Take 10 mg by mouth at bedtime.   nicotine 21 mg/24hr patch Commonly known as:  NICODERM CQ - dosed in mg/24 hours Place 1 patch (21 mg total) onto the skin daily.   omeprazole 20 MG capsule Commonly known as:  PRILOSEC Take 1  capsule (20 mg total) by mouth daily.   ondansetron 4 MG tablet Commonly known as:  ZOFRAN Take 1 tablet (4 mg total) by mouth every 6 (six) hours as needed for nausea.   oxyCODONE ER 9 MG C12a Take 9 mg by mouth 2 (two) times daily.   oxyCODONE-acetaminophen 5-325 MG tablet Commonly known as:  PERCOCET/ROXICET Take 1 tablet by mouth 2 (two) times daily. Pain   polyethylene glycol packet Commonly known as:  MIRALAX / GLYCOLAX Take 17 g by mouth daily. Start taking on:  08/15/2018   senna-docusate 8.6-50 MG tablet Commonly known as:  Senokot-S Take 1 tablet by mouth 2 (two) times daily.   vitamin B-12 1000 MCG tablet Commonly known as:  CYANOCOBALAMIN Take 1,000 mcg by mouth every other day.   vitamin C 1000 MG tablet Take 1,000 mg by mouth every other day.   Vitamin D-3 1000 units Caps Take 1,000 Units by mouth daily.   VITAMIN E PO Take 2 capsules by mouth every other day.            Durable Medical Equipment  (From admission, onward)         Start     Ordered   08/14/18 1037  For home use only DME Nebulizer machine  Once  Question:  Patient needs a nebulizer to treat with the following condition  Answer:  Lung mass   08/14/18 1038         Follow-up Information    Icard, Bradley L, DO Follow up in 2 week(s).   Specialty:  Pulmonary Disease Why:  upon hospital discharge s/p EBUS bronchoscopy  Contact information: [place] [address] [phone]        Volanda Napoleon, MD Follow up.   Specialty:  Oncology Why:  Please call Dr. Antonieta Pert office to make an appointment ASAP Contact information: [address] [place] [address] [phone]        Rogers Blocker, MD Follow up.   Specialty:  Internal Medicine Contact information: [place] [address] [phone]        Thayer Headings, MD Follow up.   Specialty:  Infectious Diseases Why:  Please call the office to schedule  an appointment ASAP Contact information: [address] [address] [phone]          No Known Allergies  Consultations:  PCCM, Dr. Marin Olp, radiation oncology   Procedures/Studies: Dg Chest 1 View  Result Date: 08/09/2018 CLINICAL DATA:  Fever and lung mass EXAM: CHEST  1 VIEW COMPARISON:  Chest CT 08/06/2018 FINDINGS: Redemonstration of mass at the medial right lung base, better characterized on recent CT. There are nodular opacities projecting over the upper left lung, likely corresponding to calcified pleural plaques. Accessed right chest wall Port-A-Cath tip is in the lower SVC. No new area consolidation. IMPRESSION: 1. Medial right lung base mass with right basilar atelectasis 2. Calcified pleural plaques Electronically Signed   By: Ulyses Jarred M.D.   On: 08/09/2018 01:08   Ct Chest W Contrast  Result Date: 08/06/2018 CLINICAL DATA:  Hemoptysis for the past 2 weeks. Shortness of breath on exertion. Chest pain. Low back pain. Smoker. Large subcarinal mass, right hilar adenopathy, bilateral pleural plaques and possible left renal mass on a recent chest CT at [facility], dated 07/23/2018. EXAM: CT CHEST, ABDOMEN, AND PELVIS WITH CONTRAST TECHNIQUE: Multidetector CT imaging of the chest, abdomen and pelvis was performed following the standard protocol during bolus administration of intravenous contrast. CONTRAST:  140mL ISOVUE-300 IOPAMIDOL (ISOVUE-300) INJECTION 61% COMPARISON:  Chest radiographs obtained earlier today. Chest CT report dated 07/23/2018 from [facility]. FINDINGS: CT CHEST FINDINGS Cardiovascular: Atheromatous calcifications, including the coronary arteries and aorta. Mediastinum/Nodes: Heterogeneous subcarinal mass invading and occluding the right lower lobe bronchus and bronchus intermedius. This mass measures 5.8 x 3.2 cm on image number 33 series 2. There is irregular soft tissue density extending in the peribronchial regions into the more  peripheral aspects of the right lower lobe. Similar-appearing enlarged right hilar lymph nodes. The largest has a short axis diameter of 16 mm on image number 38 of series 2, containing central low density. Mildly enlarged precarinal node with a short axis diameter of 11 mm on image number 23 series 2. Unremarkable included thyroid gland. Lungs/Pleura: Bilateral calcified pleural plaques. Extensive bilateral bullous changes. Mild right lower lobe atelectasis. Musculoskeletal: Mild thoracic spine degenerative changes. No evidence of bony metastatic disease. CT ABDOMEN PELVIS FINDINGS Hepatobiliary: Mild diffuse low density of the liver relative to the spleen. Normal appearing gallbladder. Pancreas: Unremarkable. No pancreatic ductal dilatation or surrounding inflammatory changes. Spleen: Normal in size without focal abnormality. Adrenals/Urinary Tract: Small upper pole left renal cyst. Normal appearing adrenal glands, right  kidney, ureters and urinary bladder. Stomach/Bowel: Stomach is within normal limits. Appendix appears normal. No evidence of bowel wall thickening, distention, or inflammatory changes. Vascular/Lymphatic: Atheromatous arterial calcifications without aneurysm. No enlarged lymph nodes. Reproductive: Moderately enlarged prostate gland. Other: Small right inguinal hernia containing fat and portion of a loop of small bowel without obstruction. Small left inguinal hernia containing fat. Musculoskeletal: Mild left and minimal right hip degenerative changes. Mild levoconvex lumbar scoliosis. Lumbar spine degenerative changes. No evidence of bony metastatic disease. IMPRESSION: 1. 5.8 x 3.2 cm subcarinal and medial right lower lobe lung mass invading and occluding the right lower lobe bronchus and bronchus intermedius. This is most compatible with a primary lung neoplasm. 2. Metastatic right hilar and precarinal adenopathy. 3. Probable tumor extending along the bronchi into the more peripheral portions of  the right lower lobe. 4. Mild right lower lobe atelectasis. 5. Centrilobular and paraseptal emphysema. 6. Bilateral calcified pleural plaques, compatible with previous asbestos exposure. 7.  Calcific coronary artery and aortic atherosclerosis. 8. Mild diffuse hepatic steatosis. 9. Moderate prostatic hypertrophy. 10. Small right inguinal hernia containing a portion of a small bowel loop without obstruction Electronically Signed   By: Claudie Revering M.D.   On: 08/06/2018 19:11   Mr Jeri Cos ZH Contrast  Result Date: 08/07/2018 CLINICAL DATA:  Lung cancer. EXAM: MRI HEAD WITHOUT AND WITH CONTRAST TECHNIQUE: Multiplanar, multiecho pulse sequences of the brain and surrounding structures were obtained without and with intravenous contrast. CONTRAST:  55mL MULTIHANCE GADOBENATE DIMEGLUMINE 529 MG/ML IV SOLN COMPARISON:  None. FINDINGS: Brain: Scattered periventricular and subcortical white matter changes are moderately advanced for age. There is moderate atrophy. No acute infarct, hemorrhage, or mass lesion is present. No pathologic enhancement is present to suggest metastatic disease to the brain or meninges. The internal auditory canals are within normal limits bilaterally. The brainstem and cerebellum are normal. Vascular: Flow is present in the major intracranial arteries. Skull and upper cervical spine: Skull base is within normal limits. Craniocervical junction is normal. Sinuses/Orbits: The paranasal sinuses are clear. There is some fluid in the right mastoid air cells. No obstructing nasopharyngeal lesion is evident. Globes and orbits are within normal limits bilaterally. IMPRESSION: 1. No evidence for metastatic disease to the brain. 2. Atrophy and white matter changes are moderately advanced for age. This is nonspecific, but likely reflects the sequela of chronic microvascular ischemia. 3. Small right mastoid effusion. No obstructing nasopharyngeal lesion is present. Electronically Signed   By: San Morelle M.D.   On: 08/07/2018 08:21   Nm Bone Scan Whole Body  Result Date: 08/08/2018 CLINICAL DATA:  Recent diagnosis of lung counter [place]. Evaluate for metastatic disease. History of back surgery EXAM: NUCLEAR MEDICINE WHOLE BODY BONE SCAN TECHNIQUE: Whole body anterior and posterior images were obtained approximately 3 hours after intravenous injection of radiopharmaceutical. RADIOPHARMACEUTICALS:  21.5 mCi Technetium-40m MDP IV COMPARISON:  CT chest, abdomen and pelvis, 08/06/2018. FINDINGS: There are no areas of abnormal radiotracer localization to suggest metastatic disease to bone. There are areas of uptake involving the sternoclavicular joints, shoulders, thoracolumbar spine, knees and ankles and feet as well as elbows and wrists, all of which appears degenerative in origin. Renal uptake is symmetric. IMPRESSION: No evidence of metastatic disease to bone. Electronically Signed   By: Lajean Manes M.D.   On: 08/08/2018 13:39   Ct Abdomen Pelvis W Contrast  Result Date: 08/06/2018 CLINICAL DATA:  Hemoptysis for the past 2 weeks. Shortness of breath on exertion. Chest pain. Low  back pain. Smoker. Large subcarinal mass, right hilar adenopathy, bilateral pleural plaques and possible left renal mass on a recent chest CT at [facility], dated 07/23/2018. EXAM: CT CHEST, ABDOMEN, AND PELVIS WITH CONTRAST TECHNIQUE: Multidetector CT imaging of the chest, abdomen and pelvis was performed following the standard protocol during bolus administration of intravenous contrast. CONTRAST:  189mL ISOVUE-300 IOPAMIDOL (ISOVUE-300) INJECTION 61% COMPARISON:  Chest radiographs obtained earlier today. Chest CT report dated 07/23/2018 from [facility]. FINDINGS: CT CHEST FINDINGS Cardiovascular: Atheromatous calcifications, including the coronary arteries and aorta. Mediastinum/Nodes: Heterogeneous subcarinal mass invading and occluding the right lower lobe bronchus and bronchus intermedius. This mass measures 5.8 x  3.2 cm on image number 33 series 2. There is irregular soft tissue density extending in the peribronchial regions into the more peripheral aspects of the right lower lobe. Similar-appearing enlarged right hilar lymph nodes. The largest has a short axis diameter of 16 mm on image number 38 of series 2, containing central low density. Mildly enlarged precarinal node with a short axis diameter of 11 mm on image number 23 series 2. Unremarkable included thyroid gland. Lungs/Pleura: Bilateral calcified pleural plaques. Extensive bilateral bullous changes. Mild right lower lobe atelectasis. Musculoskeletal: Mild thoracic spine degenerative changes. No evidence of bony metastatic disease. CT ABDOMEN PELVIS FINDINGS Hepatobiliary: Mild diffuse low density of the liver relative to the spleen. Normal appearing gallbladder. Pancreas: Unremarkable. No pancreatic ductal dilatation or surrounding inflammatory changes. Spleen: Normal in size without focal abnormality. Adrenals/Urinary Tract: Small upper pole left renal cyst. Normal appearing adrenal glands, right kidney, ureters and urinary bladder. Stomach/Bowel: Stomach is within normal limits. Appendix appears normal. No evidence of bowel wall thickening, distention, or inflammatory changes. Vascular/Lymphatic: Atheromatous arterial calcifications without aneurysm. No enlarged lymph nodes. Reproductive: Moderately enlarged prostate gland. Other: Small right inguinal hernia containing fat and portion of a loop of small bowel without obstruction. Small left inguinal hernia containing fat. Musculoskeletal: Mild left and minimal right hip degenerative changes. Mild levoconvex lumbar scoliosis. Lumbar spine degenerative changes. No evidence of bony metastatic disease. IMPRESSION: 1. 5.8 x 3.2 cm subcarinal and medial right lower lobe lung mass invading and occluding the right lower lobe bronchus and bronchus intermedius. This is most compatible with a primary lung neoplasm. 2.  Metastatic right hilar and precarinal adenopathy. 3. Probable tumor extending along the bronchi into the more peripheral portions of the right lower lobe. 4. Mild right lower lobe atelectasis. 5. Centrilobular and paraseptal emphysema. 6. Bilateral calcified pleural plaques, compatible with previous asbestos exposure. 7.  Calcific coronary artery and aortic atherosclerosis. 8. Mild diffuse hepatic steatosis. 9. Moderate prostatic hypertrophy. 10. Small right inguinal hernia containing a portion of a small bowel loop without obstruction Electronically Signed   By: Claudie Revering M.D.   On: 08/06/2018 19:11   Dg Chest Port 1 View  Result Date: 08/12/2018 CLINICAL DATA:  Right chest discomfort in patient with a known chest mass. Hemoptysis. EXAM: PORTABLE CHEST 1 VIEW COMPARISON:  Single-view of the chest 08/09/2018. CT chest, abdomen and pelvis 08/06/2018. FINDINGS: Right Port-A-Cath is again seen. The lungs are emphysematous. Small right pleural effusion is new since the most recent exam. Right basilar airspace disease is again seen. Calcified pleural plaques are seen as on the prior studies. Heart size is normal. Aortic atherosclerosis is noted. No acute bony abnormality. IMPRESSION: Small right pleural effusion is new since the most recent examination. Right basilar airspace disease is unchanged. Emphysema. Atherosclerosis. Electronically Signed   By: Inge Rise M.D.  On: 08/12/2018 12:54   Ir Imaging Guided Port Insertion  Result Date: 08/08/2018 CLINICAL DATA:  Lung mass, needs durable venous access for planned chemotherapy regimen. EXAM: TUNNELED PORT CATHETER PLACEMENT WITH ULTRASOUND AND FLUOROSCOPIC GUIDANCE FLUOROSCOPY TIME:  Less than 0.1 minute; 5  uGym2 DAP ANESTHESIA/SEDATION: Intravenous Fentanyl and Versed were administered as conscious sedation during continuous monitoring of the patient's level of consciousness and physiological / cardiorespiratory status by the radiology RN, with a total  moderate sedation time of 15 minutes. TECHNIQUE: The procedure, risks, benefits, and alternatives were explained to the patient. Questions regarding the procedure were encouraged and answered. The patient understands and consents to the procedure. As antibiotic prophylaxis, cefazolin 2 g was ordered pre-procedure and administered intravenously within one hour of incision. Patency of the right IJ vein was confirmed with ultrasound with image documentation. An appropriate skin site was determined. Skin site was marked. Region was prepped using maximum barrier technique including cap and mask, sterile gown, sterile gloves, large sterile sheet, and Chlorhexidine as cutaneous antisepsis. The region was infiltrated locally with 1% lidocaine. Under real-time ultrasound guidance, the right IJ vein was accessed with a 21 gauge micropuncture needle; the needle tip within the vein was confirmed with ultrasound image documentation. Needle was exchanged over a 018 guidewire for transitional dilator which allowed passage of the [facility] wire into the IVC. Over this, the transitional dilator was exchanged for a 5 [place] MPA catheter. A small incision was made on the right anterior chest wall and a subcutaneous pocket fashioned. The power-injectable port was positioned and its catheter tunneled to the right IJ dermatotomy site. The MPA catheter was exchanged over an Amplatz wire for a peel-away sheath, through which the port catheter, which had been trimmed to the appropriate length, was advanced and positioned under fluoroscopy with its tip at the cavoatrial junction. Spot chest radiograph confirms good catheter position and no pneumothorax. The pocket was closed with deep interrupted and subcuticular continuous 3-0 Monocryl sutures. The port was flushed per protocol. The incisions were covered with Dermabond then covered with a sterile dressing. COMPLICATIONS: COMPLICATIONS None immediate IMPRESSION: Technically successful right IJ  power-injectable port catheter placement. Ready for routine use. Electronically Signed   By: Lucrezia Europe M.D.   On: 08/08/2018 16:43    (Echo, Carotid, EGD, Colonoscopy, ERCP)    Subjective:   Discharge Exam: Vitals:   08/14/18 0441 08/14/18 0905  BP: 130/73   Pulse: 74   Resp: 16   Temp: 97.9 F (36.6 C)   SpO2: 97% 95%   Vitals:   08/13/18 2002 08/13/18 2034 08/14/18 0441 08/14/18 0905  BP:  107/65 130/73   Pulse:  90 74   Resp:  20 16   Temp:  97.8 F (36.6 C) 97.9 F (36.6 C)   TempSrc:  Oral Oral   SpO2: 92% 100% 97% 95%  Weight:      Height:        General: Pt is alert, awake, not in acute distress Cardiovascular: RRR, S1/S2 +, no rubs, no gallops Respiratory: CTA bilaterally, no wheezing, no rhonchi Abdominal: Soft, NT, ND, bowel sounds + Extremities: no edema, no cyanosis    The results of significant diagnostics from this hospitalization (including imaging, microbiology, ancillary and laboratory) are listed below for reference.     Microbiology: Recent Results (from the past 240 hour(s))  Culture, blood (routine x 2)     Status: None (Preliminary result)   Collection Time: 08/09/18  1:06 AM  Result Value  Ref Range Status   Specimen Description   Final    BLOOD RIGHT FOREARM Performed at [facility], [place] [address], [place], [place] [address]    Special Requests   Final    BOTTLES DRAWN AEROBIC AND ANAEROBIC Blood Culture adequate volume Performed at [place] [address], [place], [place] [address]    Culture   Final    NO GROWTH 4 DAYS Performed at [facility], [place] [address], [place], [place] [address]    Report Status PENDING  Incomplete  Culture, blood (routine x 2)     Status: None (Preliminary result)   Collection Time: 08/09/18  1:36 AM  Result Value Ref Range Status   Specimen Description   Final    BLOOD PORTA CATH Performed at [place] [address], [place],  [place] [address]    Special Requests   Final    BOTTLES DRAWN AEROBIC AND ANAEROBIC Blood Culture adequate volume Performed at [place] [address] [place], [place] [address]    Culture   Final    NO GROWTH 4 DAYS Performed at [facility], [place] [address], [place], [place] [address]    Report Status PENDING  Incomplete  MRSA PCR Screening     Status: None   Collection Time: 08/10/18  6:46 AM  Result Value Ref Range Status   MRSA by PCR NEGATIVE NEGATIVE Final    Comment:        The GeneXpert MRSA Assay (FDA approved for NASAL specimens only), is one component of a comprehensive MRSA colonization surveillance program. It is not intended to diagnose MRSA infection nor to guide or monitor treatment for MRSA infections. Performed at [facility], [place] [address] [place], [place] [id number]   Culture, respiratory     Status: None (Preliminary result)   Collection Time: 08/13/18 12:32 PM  Result Value Ref Range Status   Specimen Description   Final    BRONCHIAL WASHINGS Performed at [facility] [address], [place],  [address]    Special Requests   Final    NONE Performed at [facility], [place] [address], [place], [place] [address]    Gram Stain NO WBC SEEN NO ORGANISMS SEEN   Final   Culture   Final    NO GROWTH < 24 HOURS Performed at [facility], [place] [address], [place],  [address]    Report Status PENDING  Incomplete     Labs: BNP (last 3 results) No results for input(s): BNP in the last 8760 hours. Basic Metabolic Panel: Recent Labs  Lab 08/08/18 0418 08/09/18 0108 08/10/18 0326 08/11/18 0410 08/14/18 0419  NA 136 130* 136 137 136  K 4.2 3.7 3.5 3.8 4.3  CL 104 101 106 107 102  CO2 25 24 25 26 26   GLUCOSE 142* 155* 208* 192* 301*  BUN 18 13 15 12 19   CREATININE 1.17 1.04 1.22 1.13 1.05  CALCIUM 8.6* 8.4* 8.2* 8.2* 8.2*  MG 1.3* 1.4*  --   --   --     Liver Function Tests: Recent Labs  Lab 08/08/18 0418 08/10/18 0326  AST 101* 55*  ALT 86* 55*  ALKPHOS 82 89  BILITOT 0.3 0.4  PROT 7.7 7.4  ALBUMIN 2.4* 2.2*   No results for input(s): LIPASE, AMYLASE in the last 168 hours. No results for input(s): AMMONIA in the  last 168 hours. CBC: Recent Labs  Lab 08/08/18 0418 08/09/18 0108 08/10/18 0326 08/11/18 0410 08/14/18 0419  WBC 9.4 14.6* 17.1* 15.0* 12.9*  NEUTROABS  --   --   --  11.6*  --   HGB 12.4* 12.1* 11.6* 11.2* 10.4*  HCT 37.2* 35.8* 34.2* 33.2* 31.3*  MCV 95.4 93.7 94.2 93.8 95.1  PLT 223 202 191 224 252   Cardiac Enzymes: No results for input(s): CKTOTAL, CKMB, CKMBINDEX, TROPONINI in the last 168 hours. BNP: Invalid input(s): POCBNP CBG: No results for input(s): GLUCAP in the last 168 hours. D-Dimer No results for input(s): DDIMER in the last 72 hours. Hgb A1c No results for input(s): HGBA1C in the last 72 hours. Lipid Profile No results for input(s): CHOL, HDL, LDLCALC, TRIG, CHOLHDL, LDLDIRECT in the last 72 hours. Thyroid function studies No results for input(s): TSH, T4TOTAL, T3FREE, THYROIDAB in the last 72 hours.  Invalid input(s): FREET3 Anemia work up No results for input(s): VITAMINB12, FOLATE, FERRITIN, TIBC, IRON, RETICCTPCT in the last 72 hours. Urinalysis    Component Value Date/Time   COLORURINE STRAW (A) 08/09/2018 0029   APPEARANCEUR CLEAR 08/09/2018 0029   LABSPEC 1.010 08/09/2018 0029   PHURINE 6.0 08/09/2018 0029   GLUCOSEU NEGATIVE 08/09/2018 0029   HGBUR NEGATIVE 08/09/2018 0029   BILIRUBINUR NEGATIVE 08/09/2018 0029   KETONESUR NEGATIVE 08/09/2018 0029   PROTEINUR NEGATIVE 08/09/2018 0029   NITRITE NEGATIVE 08/09/2018 0029   LEUKOCYTESUR NEGATIVE 08/09/2018 0029   Sepsis Labs Invalid input(s): PROCALCITONIN,  WBC,  LACTICIDVEN Microbiology Recent Results (from the past 240 hour(s))  Culture, blood (routine x 2)     Status: None (Preliminary result)   Collection  Time: 08/09/18  1:06 AM  Result Value Ref Range Status   Specimen Description   Final    BLOOD RIGHT FOREARM Performed at [facility], [place] [address] [place], [place] [address]    Special Requests   Final    BOTTLES DRAWN AEROBIC AND ANAEROBIC Blood Culture adequate volume Performed at [facility] [address], [place], [place] [address]    Culture   Final    NO GROWTH 4 DAYS Performed at [facility], [place] [address] [place], [place] [address]    Report Status PENDING  Incomplete  Culture, blood (routine x 2)     Status: None (Preliminary result)   Collection Time: 08/09/18  1:36 AM  Result Value Ref Range Status   Specimen Description   Final    BLOOD PORTA CATH Performed at [facility] [address], [place], [place] [address]    Special Requests   Final    BOTTLES DRAWN AEROBIC AND ANAEROBIC Blood Culture adequate volume Performed at [facility] [address], [place], [place] [address]    Culture   Final    NO GROWTH 4 DAYS Performed at [facility], [place] [address], [place], [place] [address]    Report Status PENDING  Incomplete  MRSA PCR Screening     Status: None   Collection Time: 08/10/18  6:46 AM  Result Value Ref Range Status   MRSA by PCR NEGATIVE NEGATIVE Final    Comment:        The GeneXpert MRSA Assay (FDA approved for NASAL specimens only), is one component of a comprehensive MRSA colonization surveillance program. It is not intended to diagnose MRSA infection nor to guide or monitor treatment for MRSA infections. Performed at [facility], [place] [address] [place], [place] [address]   Culture, respiratory     Status: None (Preliminary result)   Collection Time: 08/13/18 12:32 PM  Result Value Ref Range Status   Specimen Description   Final    BRONCHIAL WASHINGS Performed at [facility] [address] [place], [place]  [address]    Special Requests   Final    NONE Performed at [facility], [place] [address], [place], [place] [address]    Gram Stain NO WBC SEEN NO ORGANISMS SEEN   Final   Culture   Final    NO GROWTH < 24 HOURS Performed at [facility], [place] [address], [place], [place] [address]    Report Status PENDING  Incomplete     Time coordinating discharge: 45 minutes  SIGNED:   Georgette Shell, MD  [facility] 08/14/2018, 11:41 AM Pager   If 7PM-7AM, please contact night-coverage [email or web address] Password [email or web address]

## 2018-08-14 NOTE — Progress Notes (Signed)
Spoke with pt on 8/13 concerning discharge needs. Pt states he is with [facility] for NA/PCS. Pt called [facility] while I was in the room. Pt was trying to keep his PCS hours. I explained to pt that I could not increase his hours, only his physician. Pt asked if there was a program for free ensure. That is concerned food and there are no programs here.

## 2018-08-14 NOTE — Progress Notes (Signed)
Received consult to address transportation needs - radiation appointments. Met with pt at bedside- reports he has DSS transportation set up for daily radiation appointments. Denies other needs.  Sharren Bridge, MSW, LCSW Clinical Social Work 08/14/2018 [phone]

## 2018-08-15 ENCOUNTER — Telehealth: Payer: Self-pay | Admitting: *Deleted

## 2018-08-15 ENCOUNTER — Ambulatory Visit: Payer: Medicare Other

## 2018-08-15 ENCOUNTER — Encounter (HOSPITAL_COMMUNITY): Payer: Self-pay | Admitting: Pulmonary Disease

## 2018-08-15 ENCOUNTER — Telehealth: Payer: Self-pay | Admitting: Radiation Oncology

## 2018-08-15 NOTE — Telephone Encounter (Signed)
Called pt re difficulty with rides. He said that he did not need help from our program, that he has his rides straightened out through the county.

## 2018-08-15 NOTE — Telephone Encounter (Signed)
TC from patient/pt family member Ryan Houston) inquiring about his transportation to the cancer center today for radiation treatment @ 1:15 pm.  He states that his transportation did not show up and he has missed his appt for today.  Advised pt that I would let radiation oncology know of the situation and that I will inform staff member dealing with transportation concerns, about their problem.   Call made to [facility] and advised them of pt's transportation issue for today. Spoke directly with Ryan Houston who handles transportation concerns.  Family and pt voiced understanding of the above.

## 2018-08-16 LAB — CULTURE, RESPIRATORY W GRAM STAIN
Culture: NO GROWTH
Gram Stain: NONE SEEN

## 2018-08-17 ENCOUNTER — Ambulatory Visit: Admission: RE | Admit: 2018-08-17 | Payer: Medicare Other | Source: Ambulatory Visit

## 2018-08-18 ENCOUNTER — Other Ambulatory Visit: Payer: Medicare Other

## 2018-08-18 ENCOUNTER — Telehealth: Payer: Self-pay

## 2018-08-18 ENCOUNTER — Ambulatory Visit
Admission: RE | Admit: 2018-08-18 | Discharge: 2018-08-18 | Disposition: A | Discharge status: Home / Self Care | Payer: Medicare Other | Source: Ambulatory Visit | Attending: Radiation Oncology | Admitting: Radiation Oncology

## 2018-08-18 ENCOUNTER — Ambulatory Visit: Payer: Medicare Other

## 2018-08-18 ENCOUNTER — Ambulatory Visit: Payer: Medicare Other | Admitting: Hematology & Oncology

## 2018-08-18 DIAGNOSIS — C3431 Malignant neoplasm of lower lobe, right bronchus or lung: Secondary | ICD-10-CM | POA: Insufficient documentation

## 2018-08-18 DIAGNOSIS — Z510 Encounter for antineoplastic radiation therapy: Secondary | ICD-10-CM | POA: Insufficient documentation

## 2018-08-18 NOTE — Telephone Encounter (Signed)
Spoke with [facility] of transportation concerning patient upcoming RAD appointment. They just wanted to confirm appointment dates and time.

## 2018-08-19 ENCOUNTER — Other Ambulatory Visit: Payer: Self-pay | Admitting: Radiation Oncology

## 2018-08-19 ENCOUNTER — Ambulatory Visit: Payer: Medicare Other

## 2018-08-19 ENCOUNTER — Ambulatory Visit
Admission: RE | Admit: 2018-08-19 | Discharge: 2018-08-19 | Disposition: A | Discharge status: Home / Self Care | Payer: Medicare Other | Source: Ambulatory Visit | Attending: Radiation Oncology | Admitting: Radiation Oncology

## 2018-08-19 DIAGNOSIS — C3490 Malignant neoplasm of unspecified part of unspecified bronchus or lung: Secondary | ICD-10-CM

## 2018-08-19 DIAGNOSIS — C3431 Malignant neoplasm of lower lobe, right bronchus or lung: Secondary | ICD-10-CM | POA: Diagnosis not present

## 2018-08-20 ENCOUNTER — Telehealth: Payer: Self-pay | Admitting: *Deleted

## 2018-08-20 ENCOUNTER — Ambulatory Visit
Admission: RE | Admit: 2018-08-20 | Discharge: 2018-08-20 | Disposition: A | Discharge status: Home / Self Care | Payer: Medicare Other | Source: Ambulatory Visit | Attending: Radiation Oncology | Admitting: Radiation Oncology

## 2018-08-20 ENCOUNTER — Ambulatory Visit: Payer: Medicare Other

## 2018-08-20 DIAGNOSIS — C3431 Malignant neoplasm of lower lobe, right bronchus or lung: Secondary | ICD-10-CM | POA: Diagnosis not present

## 2018-08-20 NOTE — Telephone Encounter (Signed)
CALLED PATIENT TO INFORM OF PET SCAN FOR 08-25-18 - ARRIVAL TIME- 7:30 AM @ [facility], PT. TO BE NPO- 6 HRS. PRIOR TO TEST, SPOKE WITH PATIENT AND HE IS AWARE OF THIS TEST

## 2018-08-21 ENCOUNTER — Ambulatory Visit
Admission: RE | Admit: 2018-08-21 | Discharge: 2018-08-21 | Disposition: A | Discharge status: Home / Self Care | Payer: Medicare Other | Source: Ambulatory Visit | Attending: Radiation Oncology | Admitting: Radiation Oncology

## 2018-08-21 ENCOUNTER — Ambulatory Visit: Payer: Medicare Other

## 2018-08-21 ENCOUNTER — Other Ambulatory Visit: Payer: Self-pay | Admitting: *Deleted

## 2018-08-21 DIAGNOSIS — C3431 Malignant neoplasm of lower lobe, right bronchus or lung: Secondary | ICD-10-CM | POA: Diagnosis not present

## 2018-08-21 DIAGNOSIS — C3491 Malignant neoplasm of unspecified part of right bronchus or lung: Secondary | ICD-10-CM

## 2018-08-22 ENCOUNTER — Ambulatory Visit: Payer: Medicare Other

## 2018-08-22 ENCOUNTER — Inpatient Hospital Stay (HOSPITAL_BASED_OUTPATIENT_CLINIC_OR_DEPARTMENT_OTHER): Payer: Medicare Other | Admitting: Hematology & Oncology

## 2018-08-22 ENCOUNTER — Encounter: Payer: Self-pay | Admitting: Hematology & Oncology

## 2018-08-22 ENCOUNTER — Inpatient Hospital Stay: Payer: Medicare Other

## 2018-08-22 ENCOUNTER — Other Ambulatory Visit: Payer: Self-pay

## 2018-08-22 ENCOUNTER — Telehealth: Payer: Self-pay | Admitting: *Deleted

## 2018-08-22 ENCOUNTER — Other Ambulatory Visit: Payer: Self-pay | Admitting: *Deleted

## 2018-08-22 VITALS — BP 132/76 | HR 103 | Temp 97.8°F | Resp 19 | Wt 135.0 lb

## 2018-08-22 DIAGNOSIS — M129 Arthropathy, unspecified: Secondary | ICD-10-CM | POA: Diagnosis not present

## 2018-08-22 DIAGNOSIS — R599 Enlarged lymph nodes, unspecified: Secondary | ICD-10-CM | POA: Diagnosis not present

## 2018-08-22 DIAGNOSIS — Z452 Encounter for adjustment and management of vascular access device: Secondary | ICD-10-CM | POA: Insufficient documentation

## 2018-08-22 DIAGNOSIS — R51 Headache: Secondary | ICD-10-CM

## 2018-08-22 DIAGNOSIS — R042 Hemoptysis: Secondary | ICD-10-CM | POA: Insufficient documentation

## 2018-08-22 DIAGNOSIS — K219 Gastro-esophageal reflux disease without esophagitis: Secondary | ICD-10-CM

## 2018-08-22 DIAGNOSIS — B1920 Unspecified viral hepatitis C without hepatic coma: Secondary | ICD-10-CM | POA: Diagnosis not present

## 2018-08-22 DIAGNOSIS — F17210 Nicotine dependence, cigarettes, uncomplicated: Secondary | ICD-10-CM

## 2018-08-22 DIAGNOSIS — I10 Essential (primary) hypertension: Secondary | ICD-10-CM | POA: Insufficient documentation

## 2018-08-22 DIAGNOSIS — Z79899 Other long term (current) drug therapy: Secondary | ICD-10-CM

## 2018-08-22 DIAGNOSIS — R634 Abnormal weight loss: Secondary | ICD-10-CM | POA: Insufficient documentation

## 2018-08-22 DIAGNOSIS — R222 Localized swelling, mass and lump, trunk: Secondary | ICD-10-CM | POA: Diagnosis not present

## 2018-08-22 DIAGNOSIS — C3401 Malignant neoplasm of right main bronchus: Secondary | ICD-10-CM

## 2018-08-22 DIAGNOSIS — R0602 Shortness of breath: Secondary | ICD-10-CM | POA: Insufficient documentation

## 2018-08-22 DIAGNOSIS — F1920 Other psychoactive substance dependence, uncomplicated: Secondary | ICD-10-CM

## 2018-08-22 DIAGNOSIS — R079 Chest pain, unspecified: Secondary | ICD-10-CM

## 2018-08-22 DIAGNOSIS — C3491 Malignant neoplasm of unspecified part of right bronchus or lung: Secondary | ICD-10-CM

## 2018-08-22 DIAGNOSIS — Z7189 Other specified counseling: Secondary | ICD-10-CM

## 2018-08-22 HISTORY — DX: Other specified counseling: Z71.89

## 2018-08-22 LAB — CBC WITH DIFFERENTIAL (CANCER CENTER ONLY)
BASOS ABS: 0 10*3/uL (ref 0.0–0.1)
BASOS PCT: 1 %
EOS ABS: 0.1 10*3/uL (ref 0.0–0.5)
Eosinophils Relative: 1 %
HCT: 39.6 % (ref 38.7–49.9)
HEMOGLOBIN: 13.1 g/dL (ref 13.0–17.1)
Lymphocytes Relative: 13 %
Lymphs Abs: 0.8 10*3/uL — ABNORMAL LOW (ref 0.9–3.3)
MCH: 31 pg (ref 28.0–33.4)
MCHC: 33.1 g/dL (ref 32.0–35.9)
MCV: 93.8 fL (ref 82.0–98.0)
Monocytes Absolute: 0.8 10*3/uL (ref 0.1–0.9)
Monocytes Relative: 13 %
NEUTROS PCT: 72 %
Neutro Abs: 4.7 10*3/uL (ref 1.5–6.5)
Platelet Count: 316 10*3/uL (ref 145–400)
RBC: 4.22 MIL/uL (ref 4.20–5.70)
RDW: 13.6 % (ref 11.1–15.7)
WBC: 6.5 10*3/uL (ref 4.0–10.0)

## 2018-08-22 LAB — CMP (CANCER CENTER ONLY)
ALBUMIN: 2.7 g/dL — AB (ref 3.5–5.0)
ALK PHOS: 100 U/L — AB (ref 26–84)
ALT: 73 U/L — AB (ref 10–47)
AST: 111 U/L — ABNORMAL HIGH (ref 11–38)
Anion gap: 2 — ABNORMAL LOW (ref 5–15)
BUN: 16 mg/dL (ref 7–22)
CHLORIDE: 101 mmol/L (ref 98–108)
CO2: 28 mmol/L (ref 18–33)
Calcium: 9 mg/dL (ref 8.0–10.3)
Creatinine: 1.1 mg/dL (ref 0.60–1.20)
Glucose, Bld: 235 mg/dL — ABNORMAL HIGH (ref 73–118)
Potassium: 4.1 mmol/L (ref 3.3–4.7)
SODIUM: 131 mmol/L (ref 128–145)
Total Bilirubin: 0.5 mg/dL (ref 0.2–1.6)
Total Protein: 9.5 g/dL — ABNORMAL HIGH (ref 6.4–8.1)

## 2018-08-22 LAB — LACTATE DEHYDROGENASE: LDH: 199 U/L — AB (ref 98–192)

## 2018-08-22 MED ORDER — POLYETHYLENE GLYCOL 3350 17 G PO PACK: 17.0000 g | PACK | Freq: Two times a day (BID) | ORAL | 4 refills | Status: DC

## 2018-08-22 MED ORDER — OXYCODONE HCL ER 20 MG PO T12A: 20.0000 mg | EXTENDED_RELEASE_TABLET | Freq: Two times a day (BID) | ORAL | 0 refills | Status: DC

## 2018-08-22 MED ORDER — LIDOCAINE-PRILOCAINE 2.5-2.5 % EX CREA: 1.0000 "application " | TOPICAL_CREAM | CUTANEOUS | 0 refills | Status: DC | PRN

## 2018-08-22 MED ORDER — HEPARIN SOD (PORK) LOCK FLUSH 100 UNIT/ML IV SOLN
500.0000 [IU] | Freq: Once | INTRAVENOUS | Status: AC
  Administered 2018-08-22: 500 [IU] via INTRAVENOUS
  Filled 2018-08-22: qty 5

## 2018-08-22 MED ORDER — OXYCODONE HCL 10 MG PO TABS: 10.0000 mg | ORAL_TABLET | Freq: Four times a day (QID) | ORAL | 0 refills | Status: DC | PRN

## 2018-08-22 MED ORDER — SODIUM CHLORIDE 0.9% FLUSH
10.0000 mL | INTRAVENOUS | Status: DC | PRN
  Administered 2018-08-22: 10 mL via INTRAVENOUS
  Filled 2018-08-22: qty 10

## 2018-08-22 MED ORDER — DRONABINOL 5 MG PO CAPS: 5.0000 mg | ORAL_CAPSULE | Freq: Two times a day (BID) | ORAL | 0 refills | Status: DC

## 2018-08-22 NOTE — Telephone Encounter (Signed)
CALLED PATIENT TO INFORM THAT PET SCAN HAS BEEN CANCELLED DUE TO THE INSURANCE NOT APPROVING IT, TEST WILL BE RESCHEDULED ONCE INSURANCE PRECERTS TEST, LVM FOR A RETURN CALL

## 2018-08-22 NOTE — Progress Notes (Signed)
Hematology and Oncology Follow Up Visit  Ryan Houston [id number] Mar 15, 1946 72 y.o. 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  We first saw him back on August 7.  At that time, he was admitted that day because of hemoptysis.  He had a large subcarinal mass with invasion into the right mainstem bronchus.  He was hospitalized.  Despite multiple invasive procedures, try make a diagnosis was incredibly difficult.  I think that he had a EBUS procedure done.  This was done on 08/13/2018.  The pathology report ([id number]) showed malignant cells consistent with carcinoma.  In speaking to the pathologist, he really cannot be confirmed or confident that this was adenocarcinoma although he thinks that it is.  He was started on radiation therapy in the hospital for his hemoptysis.  He had staging studies done in the hospital which did not show any evidence of metastatic disease.  Because of this, I would say that he has stage IIIb disease.  I really do not think this is operable by any means.  Of note he had his first bronchoscopy there was invasion into the right mainstem bronchus.  We will go ahead and get him started on concurrent chemotherapy.  I think weekly carboplatinum/Taxol would be reasonable.  He was discharged and was only given a few days of pain medication.  He was told that I would take care of everything once he was out of the hospital.  This is no surprise.    He is going to see infectious disease to get started on treatment for hepatitis C.  This is critical.  He has a very high titer of hepatitis C virus.  His appetite is doing a little bit better.  He is holding his own.  He has had no headache.  He is on some supplemental  oxygen.  Overall, I said his performance status is ECOG 1-2.  Medications:  Current Outpatient Medications:  .  albuterol (PROVENTIL HFA;VENTOLIN HFA) 108 (90 BASE) MCG/ACT inhaler, Inhale 1-2 puffs into the lungs every 6 (six) hours as needed for wheezing., Disp: 1 Inhaler, Rfl: 0 .  Ascorbic Acid (VITAMIN C) 1000 MG tablet, Take 1,000 mg by mouth every other day., Disp: , Rfl:  .  azelastine (ASTELIN) 0.1 % nasal spray, Place into both nostrils 2 (two) times daily. Use in each nostril as directed, Disp: , Rfl:  .  Cholecalciferol (VITAMIN D-3) 1000 UNITS CAPS, Take 1,000 Units by mouth daily. , Disp: , Rfl:  .  dronabinol (MARINOL) 5 MG capsule, Take 1 capsule (5 mg total) by mouth 2 (two) times daily before a meal., Disp: 60 capsule, Rfl: 0 .  feeding supplement, ENSURE ENLIVE, (ENSURE ENLIVE) LIQD, Take 237 mLs by mouth 3 (three) times daily between meals., Disp: 237 mL, Rfl: 12 .  gabapentin (NEURONTIN) 300 MG capsule, Take 300 mg by mouth 3 (three) times daily., Disp: , Rfl:  .  hydrOXYzine (ATARAX/VISTARIL) 25 MG tablet, Take 1 tablet (25 mg total) by mouth every 6 (six) hours., Disp: 30 tablet, Rfl: 0 .  ipratropium-albuterol (DUONEB) 0.5-2.5 (3) MG/3ML SOLN, Take 3 mLs by nebulization 3 (three) times daily., Disp: 360 mL, Rfl: 2 .  ipratropium-albuterol (DUONEB) 0.5-2.5 (3)  MG/3ML SOLN, Inhale 3 mLs into the lungs every 4 (four) hours as needed., Disp: 360 mL, Rfl: 2 .  lidocaine-prilocaine (EMLA) cream, Apply 1 application topically as needed. Apply as directed, Disp: 30 g, Rfl: 0 .  mirabegron ER (MYRBETRIQ) 25 MG TB24 tablet, Take 1 tablet (25 mg total) by mouth daily., Disp: 30 tablet, Rfl: 0 .  montelukast (SINGULAIR) 10 MG tablet, Take 10 mg by mouth at bedtime., Disp: , Rfl:  .  nicotine (NICODERM CQ - DOSED IN MG/24 HOURS) 21 mg/24hr patch, Place 1 patch (21 mg total) onto the skin daily., Disp: 28 patch, Rfl: 0 .  Omega-3 Fatty Acids (FISH OIL PO), Take 1 capsule by mouth every  other day., Disp: , Rfl:  .  omeprazole (PRILOSEC) 20 MG capsule, Take 1 capsule (20 mg total) by mouth daily., Disp: 30 capsule, Rfl: 0 .  ondansetron (ZOFRAN) 4 MG tablet, Take 1 tablet (4 mg total) by mouth every 6 (six) hours as needed for nausea., Disp: 20 tablet, Rfl: 0 .  oxyCODONE (OXYCONTIN) 20 mg 12 hr tablet, Take 1 tablet (20 mg total) by mouth every 12 (twelve) hours., Disp: 60 tablet, Rfl: 0 .  oxyCODONE 10 MG TABS, Take 1 tablet (10 mg total) by mouth every 6 (six) hours as needed for severe pain., Disp: 90 tablet, Rfl: 0 .  polyethylene glycol (MIRALAX / GLYCOLAX) packet, Take 17 g by mouth 2 (two) times daily., Disp: 100 each, Rfl: 4 .  senna-docusate (SENOKOT-S) 8.6-50 MG tablet, Take 1 tablet by mouth 2 (two) times daily., Disp: , Rfl:  .  vitamin B-12 (CYANOCOBALAMIN) 1000 MCG tablet, Take 1,000 mcg by mouth every other day., Disp: , Rfl:  .  VITAMIN E PO, Take 2 capsules by mouth every other day., Disp: , Rfl:   Allergies: No Known Allergies  Past Medical History, Surgical history, Social history, and Family History were reviewed and updated.  Review of Systems: Review of Systems  Constitutional: Positive for appetite change and fatigue.  HENT:  Negative.   Eyes: Negative.   Respiratory: Positive for hemoptysis and shortness of breath.   Cardiovascular: Negative.   Gastrointestinal: Positive for constipation.  Endocrine: Negative.   Genitourinary: Negative.    Musculoskeletal: Positive for myalgias and neck pain.  Skin: Negative.   Neurological: Negative.   Hematological: Negative.   Psychiatric/Behavioral: Negative.     Physical Exam:  weight is 135 lb (61.2 kg). His oral temperature is 97.8 F (36.6 C). His blood pressure is 132/76 and his pulse is 103 (abnormal). His respiration is 19 and oxygen saturation is 96%.   Wt Readings from Last 3 Encounters:  08/22/18 135 lb (61.2 kg)  08/13/18 133 lb 14.4 oz (60.7 kg)  08/06/18 132 lb 1.9 oz (59.9 kg)     Physical Exam  Constitutional: He is oriented to person, place, and time.  HENT:  Head: Normocephalic and atraumatic.  Mouth/Throat: Oropharynx is clear and moist.  Eyes: Pupils are equal, round, and reactive to light. EOM are normal.  Neck: Normal range of motion.  Cardiovascular: Normal rate, regular rhythm and normal heart sounds.  Pulmonary/Chest: Effort normal and breath sounds normal.  Abdominal: Soft. Bowel sounds are normal.  Musculoskeletal: Normal range of motion. He exhibits no edema, tenderness or deformity.  Lymphadenopathy:    He has no cervical adenopathy.  Neurological: He is alert and oriented to person, place, and time.  Skin: Skin is warm and dry. No rash noted. No erythema.  Psychiatric: He  has a normal mood and affect. His behavior is normal. Judgment and thought content normal.  Vitals reviewed.    Lab Results  Component Value Date   WBC 6.5 08/22/2018   HGB 13.1 08/22/2018   HCT 39.6 08/22/2018   MCV 93.8 08/22/2018   PLT 316 08/22/2018     Chemistry      Component Value Date/Time   NA 131 08/22/2018 0802   K 4.1 08/22/2018 0802   CL 101 08/22/2018 0802   CO2 28 08/22/2018 0802   BUN 16 08/22/2018 0802   CREATININE 1.10 08/22/2018 0802   CREATININE 1.31 (H) 11/09/2015 1528      Component Value Date/Time   CALCIUM 9.0 08/22/2018 0802   ALKPHOS 100 (H) 08/22/2018 0802   AST 111 (H) 08/22/2018 0802   ALT 73 (H) 08/22/2018 0802   BILITOT 0.5 08/22/2018 0802      Impression and Plan: Mr. Kluever is a 72 year old African-American male.  He has locally advanced, inoperable, bronchogenic carcinoma.  He is not operable from my point of view.  Our goal is to see if we can treat this cancer down so that he will not be bothered.  Hopefully will not have hemoptysis.  I am thankful that he was able to start radiation while he was in the hospital.  We will now do concurrent chemotherapy with the radiation and I think this will also help quite a  bit.  Hopefully, we will see a good response.  I sent in a prescription for the oxycodone and OxyContin for him.  He needs these.  We will start his treatments next week.  I went over the side effects with he and his daughter.  I think that he will tolerate treatment well.  It is weekly lower dose treatment that he really should not have a lot of problems with.  We will plan to see him back right after Labor Day when he comes in for his second cycle of treatment.  I spent about 40 minutes with he and his daughter.  All the time was spent face-to-face with them.  I helped counsel them and help coordinate his care for his next chemotherapy appointment.   Volanda Napoleon, MD 8/23/20194:57 PM

## 2018-08-22 NOTE — Progress Notes (Signed)
START ON PATHWAY REGIMEN - Non-Small Cell Lung     Administer weekly:     Paclitaxel      Carboplatin   **Always confirm dose/schedule in your pharmacy ordering system**  Patient Characteristics: Stage III - Unresectable, PS = 0, 1 AJCC T Category: T3 Current Disease Status: No Distant Mets or Local Recurrence AJCC N Category: N2 AJCC M Category: M0 AJCC 8 Stage Grouping: IIIB Performance Status: PS = 0, 1 Intent of Therapy: Curative Intent, Discussed with Patient

## 2018-08-22 NOTE — Patient Instructions (Signed)
Implanted Port Insertion, Care After °This sheet gives you information about how to care for yourself after your procedure. Your health care provider may also give you more specific instructions. If you have problems or questions, contact your health care provider. °What can I expect after the procedure? °After your procedure, it is common to have: °· Discomfort at the port insertion site. °· Bruising on the skin over the port. This should improve over 3-4 days. ° °Follow these instructions at home: °Port care °· After your port is placed, you will get a manufacturer's information card. The card has information about your port. Keep this card with you at all times. °· Take care of the port as told by your health care provider. Ask your health care provider if you or a family member can get training for taking care of the port at home. A home health care nurse may also take care of the port. °· Make sure to remember what type of port you have. °Incision care °· Follow instructions from your health care provider about how to take care of your port insertion site. Make sure you: °? Wash your hands with soap and water before you change your bandage (dressing). If soap and water are not available, use hand sanitizer. °? Change your dressing as told by your health care provider. °? Leave stitches (sutures), skin glue, or adhesive strips in place. These skin closures may need to stay in place for 2 weeks or longer. If adhesive strip edges start to loosen and curl up, you may trim the loose edges. Do not remove adhesive strips completely unless your health care provider tells you to do that. °· Check your port insertion site every day for signs of infection. Check for: °? More redness, swelling, or pain. °? More fluid or blood. °? Warmth. °? Pus or a bad smell. °General instructions °· Do not take baths, swim, or use a hot tub until your health care provider approves. °· Do not lift anything that is heavier than 10 lb (4.5  kg) for a week, or as told by your health care provider. °· Ask your health care provider when it is okay to: °? Return to work or school. °? Resume usual physical activities or sports. °· Do not drive for 24 hours if you were given a medicine to help you relax (sedative). °· Take over-the-counter and prescription medicines only as told by your health care provider. °· Wear a medical alert bracelet in case of an emergency. This will tell any health care providers that you have a port. °· Keep all follow-up visits as told by your health care provider. This is important. °Contact a health care provider if: °· You cannot flush your port with saline as directed, or you cannot draw blood from the port. °· You have a fever or chills. °· You have more redness, swelling, or pain around your port insertion site. °· You have more fluid or blood coming from your port insertion site. °· Your port insertion site feels warm to the touch. °· You have pus or a bad smell coming from the port insertion site. °Get help right away if: °· You have chest pain or shortness of breath. °· You have bleeding from your port that you cannot control. °Summary °· Take care of the port as told by your health care provider. °· Change your dressing as told by your health care provider. °· Keep all follow-up visits as told by your health care provider. °  This information is not intended to replace advice given to you by your health care provider. Make sure you discuss any questions you have with your health care provider. °Document Released: 10/07/2013 Document Revised: 11/07/2016 Document Reviewed: 11/07/2016 °Elsevier Interactive Patient Education © 2017 [employer] ° °

## 2018-08-25 ENCOUNTER — Telehealth: Payer: Self-pay | Admitting: *Deleted

## 2018-08-25 ENCOUNTER — Ambulatory Visit: Payer: Medicare Other

## 2018-08-25 ENCOUNTER — Ambulatory Visit (HOSPITAL_COMMUNITY): Payer: Medicare Other

## 2018-08-25 ENCOUNTER — Other Ambulatory Visit: Payer: Self-pay | Admitting: Family

## 2018-08-25 NOTE — Telephone Encounter (Signed)
Called patient to ask about test, lvm for a return call

## 2018-08-26 ENCOUNTER — Ambulatory Visit
Admission: RE | Admit: 2018-08-26 | Discharge: 2018-08-26 | Disposition: A | Discharge status: Home / Self Care | Payer: Medicare Other | Source: Ambulatory Visit | Attending: Radiation Oncology | Admitting: Radiation Oncology

## 2018-08-26 DIAGNOSIS — C3431 Malignant neoplasm of lower lobe, right bronchus or lung: Secondary | ICD-10-CM | POA: Diagnosis not present

## 2018-08-27 ENCOUNTER — Inpatient Hospital Stay: Payer: Medicare Other

## 2018-08-27 ENCOUNTER — Ambulatory Visit: Payer: Medicare Other | Admitting: Hematology & Oncology

## 2018-08-27 ENCOUNTER — Other Ambulatory Visit: Payer: Self-pay | Admitting: Radiation Oncology

## 2018-08-27 ENCOUNTER — Ambulatory Visit
Admission: RE | Admit: 2018-08-27 | Discharge: 2018-08-27 | Disposition: A | Discharge status: Home / Self Care | Payer: Medicare Other | Source: Ambulatory Visit | Attending: Radiation Oncology | Admitting: Radiation Oncology

## 2018-08-27 DIAGNOSIS — C3431 Malignant neoplasm of lower lobe, right bronchus or lung: Secondary | ICD-10-CM | POA: Diagnosis not present

## 2018-08-27 MED ORDER — BENZONATATE 100 MG PO CAPS: 100.0000 mg | ORAL_CAPSULE | Freq: Three times a day (TID) | ORAL | 0 refills | Status: DC | PRN

## 2018-08-28 ENCOUNTER — Ambulatory Visit
Admission: RE | Admit: 2018-08-28 | Discharge: 2018-08-28 | Disposition: A | Discharge status: Home / Self Care | Payer: Medicare Other | Source: Ambulatory Visit | Attending: Radiation Oncology | Admitting: Radiation Oncology

## 2018-08-28 DIAGNOSIS — C3431 Malignant neoplasm of lower lobe, right bronchus or lung: Secondary | ICD-10-CM | POA: Diagnosis not present

## 2018-08-29 ENCOUNTER — Ambulatory Visit
Admission: RE | Admit: 2018-08-29 | Discharge: 2018-08-29 | Disposition: A | Discharge status: Home / Self Care | Payer: Medicare Other | Source: Ambulatory Visit | Attending: Radiation Oncology | Admitting: Radiation Oncology

## 2018-08-29 DIAGNOSIS — C3431 Malignant neoplasm of lower lobe, right bronchus or lung: Secondary | ICD-10-CM | POA: Diagnosis not present

## 2018-09-02 ENCOUNTER — Ambulatory Visit
Admission: RE | Admit: 2018-09-02 | Discharge: 2018-09-02 | Disposition: A | Discharge status: Home / Self Care | Payer: Medicare Other | Source: Ambulatory Visit | Attending: Radiation Oncology | Admitting: Radiation Oncology

## 2018-09-02 DIAGNOSIS — C3431 Malignant neoplasm of lower lobe, right bronchus or lung: Secondary | ICD-10-CM | POA: Diagnosis present

## 2018-09-02 DIAGNOSIS — Z510 Encounter for antineoplastic radiation therapy: Secondary | ICD-10-CM | POA: Insufficient documentation

## 2018-09-03 ENCOUNTER — Inpatient Hospital Stay: Payer: Medicare Other | Attending: Hematology & Oncology

## 2018-09-03 ENCOUNTER — Ambulatory Visit: Payer: Medicare Other

## 2018-09-03 ENCOUNTER — Other Ambulatory Visit: Payer: Self-pay | Admitting: *Deleted

## 2018-09-03 ENCOUNTER — Inpatient Hospital Stay: Payer: Medicare Other

## 2018-09-03 ENCOUNTER — Encounter: Payer: Self-pay | Admitting: *Deleted

## 2018-09-03 ENCOUNTER — Other Ambulatory Visit: Payer: Self-pay | Admitting: Hematology & Oncology

## 2018-09-03 VITALS — BP 132/71 | HR 90 | Temp 98.2°F | Resp 17

## 2018-09-03 VITALS — BP 102/63 | HR 104 | Temp 98.0°F | Resp 18

## 2018-09-03 DIAGNOSIS — Z79899 Other long term (current) drug therapy: Secondary | ICD-10-CM | POA: Diagnosis not present

## 2018-09-03 DIAGNOSIS — Z5111 Encounter for antineoplastic chemotherapy: Secondary | ICD-10-CM | POA: Diagnosis not present

## 2018-09-03 DIAGNOSIS — R918 Other nonspecific abnormal finding of lung field: Secondary | ICD-10-CM

## 2018-09-03 DIAGNOSIS — B1920 Unspecified viral hepatitis C without hepatic coma: Secondary | ICD-10-CM | POA: Insufficient documentation

## 2018-09-03 DIAGNOSIS — C3401 Malignant neoplasm of right main bronchus: Secondary | ICD-10-CM

## 2018-09-03 LAB — CMP (CANCER CENTER ONLY)
ALBUMIN: 2.4 g/dL — AB (ref 3.5–5.0)
ALT: 79 U/L — ABNORMAL HIGH (ref 10–47)
AST: 110 U/L — AB (ref 11–38)
Alkaline Phosphatase: 76 U/L (ref 26–84)
Anion gap: 3 — ABNORMAL LOW (ref 5–15)
BILIRUBIN TOTAL: 0.8 mg/dL (ref 0.2–1.6)
BUN: 14 mg/dL (ref 7–22)
CALCIUM: 8.6 mg/dL (ref 8.0–10.3)
CO2: 29 mmol/L (ref 18–33)
CREATININE: 1.1 mg/dL (ref 0.60–1.20)
Chloride: 98 mmol/L (ref 98–108)
GLUCOSE: 133 mg/dL — AB (ref 73–118)
Potassium: 3.6 mmol/L (ref 3.3–4.7)
Sodium: 130 mmol/L (ref 128–145)
Total Protein: 8.7 g/dL — ABNORMAL HIGH (ref 6.4–8.1)

## 2018-09-03 LAB — CBC WITH DIFFERENTIAL (CANCER CENTER ONLY)
BASOS ABS: 0 10*3/uL (ref 0.0–0.1)
Basophils Relative: 0 %
Eosinophils Absolute: 0.2 10*3/uL (ref 0.0–0.5)
Eosinophils Relative: 3 %
HEMATOCRIT: 36.2 % — AB (ref 38.7–49.9)
Hemoglobin: 12.1 g/dL — ABNORMAL LOW (ref 13.0–17.1)
LYMPHS ABS: 0.6 10*3/uL — AB (ref 0.9–3.3)
LYMPHS PCT: 7 %
MCH: 31.3 pg (ref 28.0–33.4)
MCHC: 33.4 g/dL (ref 32.0–35.9)
MCV: 93.5 fL (ref 82.0–98.0)
MONOS PCT: 11 %
Monocytes Absolute: 0.8 10*3/uL (ref 0.1–0.9)
NEUTROS ABS: 6.3 10*3/uL (ref 1.5–6.5)
Neutrophils Relative %: 79 %
Platelet Count: 133 10*3/uL — ABNORMAL LOW (ref 145–400)
RBC: 3.87 MIL/uL — ABNORMAL LOW (ref 4.20–5.70)
RDW: 13.6 % (ref 11.1–15.7)
WBC Count: 7.9 10*3/uL (ref 4.0–10.0)

## 2018-09-03 MED ORDER — SODIUM CHLORIDE 0.9 % IV SOLN
45.0000 mg/m2 | Freq: Once | INTRAVENOUS | Status: AC
  Administered 2018-09-03: 78 mg via INTRAVENOUS
  Filled 2018-09-03: qty 13

## 2018-09-03 MED ORDER — SODIUM CHLORIDE 0.9 % IV SOLN
155.0000 mg | Freq: Once | INTRAVENOUS | Status: AC
  Administered 2018-09-03: 160 mg via INTRAVENOUS
  Filled 2018-09-03: qty 16

## 2018-09-03 MED ORDER — DIPHENHYDRAMINE HCL 50 MG/ML IJ SOLN
50.0000 mg | Freq: Once | INTRAMUSCULAR | Status: AC
  Administered 2018-09-03: 50 mg via INTRAVENOUS

## 2018-09-03 MED ORDER — PROCHLORPERAZINE MALEATE 10 MG PO TABS: 10.0000 mg | ORAL_TABLET | Freq: Four times a day (QID) | ORAL | 1 refills | Status: DC | PRN

## 2018-09-03 MED ORDER — LORAZEPAM 0.5 MG PO TABS: 0.5000 mg | ORAL_TABLET | Freq: Four times a day (QID) | ORAL | 0 refills | Status: DC | PRN

## 2018-09-03 MED ORDER — HEPARIN SOD (PORK) LOCK FLUSH 100 UNIT/ML IV SOLN
500.0000 [IU] | Freq: Once | INTRAVENOUS | Status: AC | PRN
  Administered 2018-09-03: 500 [IU]
  Filled 2018-09-03: qty 5

## 2018-09-03 MED ORDER — FAMOTIDINE IN NACL 20-0.9 MG/50ML-% IV SOLN
20.0000 mg | Freq: Once | INTRAVENOUS | Status: AC
  Administered 2018-09-03: 20 mg via INTRAVENOUS

## 2018-09-03 MED ORDER — FAMOTIDINE IN NACL 20-0.9 MG/50ML-% IV SOLN
INTRAVENOUS | Status: AC
  Filled 2018-09-03: qty 50

## 2018-09-03 MED ORDER — DIPHENHYDRAMINE HCL 50 MG/ML IJ SOLN
INTRAMUSCULAR | Status: AC
  Filled 2018-09-03: qty 1

## 2018-09-03 MED ORDER — DEXAMETHASONE 4 MG PO TABS: 8.0000 mg | ORAL_TABLET | Freq: Every day | ORAL | 1 refills | Status: DC

## 2018-09-03 MED ORDER — LIDOCAINE-PRILOCAINE 2.5-2.5 % EX CREA: TOPICAL_CREAM | CUTANEOUS | 3 refills | Status: DC

## 2018-09-03 MED ORDER — PALONOSETRON HCL INJECTION 0.25 MG/5ML
0.2500 mg | Freq: Once | INTRAVENOUS | Status: AC
  Administered 2018-09-03: 0.25 mg via INTRAVENOUS

## 2018-09-03 MED ORDER — PALONOSETRON HCL INJECTION 0.25 MG/5ML
INTRAVENOUS | Status: AC
  Filled 2018-09-03: qty 5

## 2018-09-03 MED ORDER — ONDANSETRON HCL 8 MG PO TABS: 8.0000 mg | ORAL_TABLET | Freq: Two times a day (BID) | ORAL | 1 refills | Status: DC | PRN

## 2018-09-03 MED ORDER — SODIUM CHLORIDE 0.9 % IV SOLN
20.0000 mg | Freq: Once | INTRAVENOUS | Status: AC
  Administered 2018-09-03: 20 mg via INTRAVENOUS
  Filled 2018-09-03: qty 2

## 2018-09-03 MED ORDER — SODIUM CHLORIDE 0.9% FLUSH
10.0000 mL | INTRAVENOUS | Status: DC | PRN
  Administered 2018-09-03: 10 mL
  Filled 2018-09-03: qty 10

## 2018-09-03 MED ORDER — SODIUM CHLORIDE 0.9 % IV SOLN
Freq: Once | INTRAVENOUS | Status: AC
  Administered 2018-09-03: 09:00:00 via INTRAVENOUS
  Filled 2018-09-03: qty 250

## 2018-09-03 NOTE — Patient Instructions (Signed)

## 2018-09-03 NOTE — Patient Instructions (Addendum)
[facility] Discharge Instructions for Patients Receiving Chemotherapy  Today you received the following chemotherapy agents Carboplatin, Taxol  To help prevent nausea and vomiting after your treatment, we encourage you to take your nausea medication   1)Beginning the day after chemotherapy (Thursday) begin taking Dexamethasone (Decadron) 2 tablets by mouth every day for 2 days.   2) Can take Lorazepam (Ativan) by mouth every 6 hours AS NEEDED for nausea or vomiting.  3) Can also take Prochlorperazine (Compazine) 1 tablet by mouth every 6 hours for nausea or vomiting AS NEEDED 4)Beginning Friday can take Ondansetron (Zofran) by mouth twice daily as needed for nausea/vomiting.    If you develop nausea and vomiting that is not controlled by your nausea medication, call the clinic.     BELOW ARE SYMPTOMS THAT SHOULD BE REPORTED IMMEDIATELY:  *FEVER GREATER THAN 100.5 F  *CHILLS WITH OR WITHOUT FEVER  NAUSEA AND VOMITING THAT IS NOT CONTROLLED WITH YOUR NAUSEA MEDICATION  *UNUSUAL SHORTNESS OF BREATH  *UNUSUAL BRUISING OR BLEEDING  TENDERNESS IN MOUTH AND THROAT WITH OR WITHOUT PRESENCE OF ULCERS  *URINARY PROBLEMS  *BOWEL PROBLEMS  UNUSUAL RASH Items with * indicate a potential emergency and should be followed up as soon as possible.  Feel free to call the clinic should you have any questions or concerns. The clinic phone number is [phone].  Please show the [facility] at check-in to the Emergency Department and triage nurse.     Famotidine injection What is this medicine? FAMOTIDINE (fa MOE ti deen) is a type of antihistamine that blocks the release of stomach acid. It is used to treat stomach or intestinal ulcers. It can relieve ulcer pain and discomfort, and the heartburn from acid reflux. This medicine may be used for other purposes; ask your health care provider or pharmacist if you have questions. COMMON BRAND NAME(S): Pepcid What should I  tell my health care provider before I take this medicine? They need to know if you have any of these conditions: -kidney or liver disease -an unusual or allergic reaction to famotidine, other medicines, foods, dyes, or preservatives -pregnant or trying to get pregnant -breast-feeding How should I use this medicine? This medicine is for infusion into a vein. It is given by a health care professional in a hospital or clinic setting. Talk to your pediatrician regarding the use of this medicine in children. Special care may be needed. Overdosage: If you think you have taken too much of this medicine contact a poison control center or emergency room at once. NOTE: This medicine is only for you. Do not share this medicine with others. What if I miss a dose? This does not apply. What may interact with this medicine? -delavirdine -itraconazole -ketoconazole This list may not describe all possible interactions. Give your health care provider a list of all the medicines, herbs, non-prescription drugs, or dietary supplements you use. Also tell them if you smoke, drink alcohol, or use illegal drugs. Some items may interact with your medicine. What should I watch for while using this medicine? Tell your doctor or health care professional if your condition does not start to get better or gets worse. Do not take with aspirin, ibuprofen, or other antiinflammatory medicines. These can aggravate your condition. Do not smoke cigarettes or drink alcohol. These increase irritation in your stomach and can increase the time it will take for ulcers to heal. Cigarettes and alcohol can also worsen acid reflux or heartburn. If you get black,  tarry stools or vomit up what looks like coffee grounds, call your doctor or health care professional at once. You may have a bleeding ulcer. What side effects may I notice from receiving this medicine? Side effects that you should report to your doctor or health care professional  as soon as possible: -allergic reactions like skin rash, itching or hives, swelling of the face, lips, or tongue -agitation, nervousness -confusion -hallucinations Side effects that usually do not require medical attention (report to your doctor or health care professional if they continue or are bothersome): -constipation -diarrhea -dizziness -headache This list may not describe all possible side effects. Call your doctor for medical advice about side effects. You may report side effects to FDA at 1-800-FDA-1088. Where should I keep my medicine? This medicine is given in a hospital or clinic. You will not be given this medicine to store at home. NOTE: This sheet is a summary. It may not cover all possible information. If you have questions about this medicine, talk to your doctor, pharmacist, or health care provider.  2018 Elsevier/Gold Standard (2008-04-21 13:24:51) Diphenhydramine injection What is this medicine? DIPHENHYDRAMINE (dye fen HYE dra meen) is an antihistamine. It is used to treat the symptoms of an allergic reaction and motion sickness. It is also used to treat Parkinson's disease. This medicine may be used for other purposes; ask your health care provider or pharmacist if you have questions. COMMON BRAND NAME(S): Benadryl What should I tell my health care provider before I take this medicine? They need to know if you have any of these conditions: -asthma or lung disease -glaucoma -high blood pressure or heart disease -liver disease -pain or difficulty passing urine -prostate trouble -ulcers or other stomach problems -an unusual or allergic reaction to diphenhydramine, antihistamines, other medicines foods, dyes, or preservatives -pregnant or trying to get pregnant -breast-feeding How should I use this medicine? This medicine is for injection into a vein or a muscle. It is usually given by a health care professional in a hospital or clinic setting. If you get this  medicine at home, you will be taught how to prepare and give this medicine. Use exactly as directed. Take your medicine at regular intervals. Do not take your medicine more often than directed. It is important that you put your used needles and syringes in a special sharps container. Do not put them in a trash can. If you do not have a sharps container, call your pharmacist or healthcare provider to get one. Talk to your pediatrician regarding the use of this medicine in children. While this drug may be prescribed for selected conditions, precautions do apply. This medicine is not approved for use in newborns and premature babies. Patients over 37 years old may have a stronger reaction and need a smaller dose. Overdosage: If you think you have taken too much of this medicine contact a poison control center or emergency room at once. NOTE: This medicine is only for you. Do not share this medicine with others. What if I miss a dose? If you miss a dose, take it as soon as you can. If it is almost time for your next dose, take only that dose. Do not take double or extra doses. What may interact with this medicine? Do not take this medicine with any of the following medications: -MAOIs like Carbex, Eldepryl, Marplan, Nardil, and Parnate This medicine may also interact with the following medications: -alcohol -barbiturates, like phenobarbital -medicines for bladder spasm like oxybutynin, tolterodine -medicines for  blood pressure -medicines for depression, anxiety, or psychotic disturbances -medicines for movement abnormalities or Parkinson's disease -medicines for sleep -other medicines for cold, cough or allergy -some medicines for the stomach like chlordiazepoxide, dicyclomine This list may not describe all possible interactions. Give your health care provider a list of all the medicines, herbs, non-prescription drugs, or dietary supplements you use. Also tell them if you smoke, drink alcohol, or  use illegal drugs. Some items may interact with your medicine. What should I watch for while using this medicine? Your condition will be monitored carefully while you are receiving this medicine. Tell your doctor or healthcare professional if your symptoms do not start to get better or if they get worse. You may get drowsy or dizzy. Do not drive, use machinery, or do anything that needs mental alertness until you know how this medicine affects you. Do not stand or sit up quickly, especially if you are an older patient. This reduces the risk of dizzy or fainting spells. Alcohol may interfere with the effect of this medicine. Avoid alcoholic drinks. Your mouth may get dry. Chewing sugarless gum or sucking hard candy, and drinking plenty of water may help. Contact your doctor if the problem does not go away or is severe. What side effects may I notice from receiving this medicine? Side effects that you should report to your doctor or health care professional as soon as possible: -allergic reactions like skin rash, itching or hives, swelling of the face, lips, or tongue -breathing problems -changes in vision -chills -confused, agitated, nervous -irregular or fast heartbeat -low blood pressure -seizures -tremor -trouble passing urine -unusual bleeding or bruising -unusually weak or tired Side effects that usually do not require medical attention (report to your doctor or health care professional if they continue or are bothersome): -constipation, diarrhea -drowsy -headache -loss of appetite -stomach upset, vomiting -sweating -thick mucous This list may not describe all possible side effects. Call your doctor for medical advice about side effects. You may report side effects to FDA at 1-800-FDA-1088. Where should I keep my medicine? Keep out of the reach of children. If you are using this medicine at home, you will be instructed on how to store this medicine. Throw away any unused medicine  after the expiration date on the label. NOTE: This sheet is a summary. It may not cover all possible information. If you have questions about this medicine, talk to your doctor, pharmacist, or health care provider.  2018 Elsevier/Gold Standard (2008-04-06 14:28:35) Palonosetron Injection What is this medicine? PALONOSETRON (pal oh NOE se tron) is used to prevent nausea and vomiting caused by chemotherapy. It also helps prevent delayed nausea and vomiting that may occur a few days after your treatment. This medicine may be used for other purposes; ask your health care provider or pharmacist if you have questions. COMMON BRAND NAME(S): Aloxi What should I tell my health care provider before I take this medicine? They need to know if you have any of these conditions: -an unusual or allergic reaction to palonosetron, dolasetron, granisetron, ondansetron, other medicines, foods, dyes, or preservatives -pregnant or trying to get pregnant -breast-feeding How should I use this medicine? This medicine is for infusion into a vein. It is given by a health care professional in a hospital or clinic setting. Talk to your pediatrician regarding the use of this medicine in children. While this drug may be prescribed for children as young as 1 month for selected conditions, precautions do apply. Overdosage: If you think  you have taken too much of this medicine contact a poison control center or emergency room at once. NOTE: This medicine is only for you. Do not share this medicine with others. What if I miss a dose? This does not apply. What may interact with this medicine? -certain medicines for depression, anxiety, or psychotic disturbances -fentanyl -linezolid -MAOIs like Carbex, Eldepryl, Marplan, Nardil, and Parnate -methylene blue (injected into a vein) -tramadol This list may not describe all possible interactions. Give your health care provider a list of all the medicines, herbs, non-prescription  drugs, or dietary supplements you use. Also tell them if you smoke, drink alcohol, or use illegal drugs. Some items may interact with your medicine. What should I watch for while using this medicine? Your condition will be monitored carefully while you are receiving this medicine. What side effects may I notice from receiving this medicine? Side effects that you should report to your doctor or health care professional as soon as possible: -allergic reactions like skin rash, itching or hives, swelling of the face, lips, or tongue -breathing problems -confusion -dizziness -fast, irregular heartbeat -fever and chills -loss of balance or coordination -seizures -sweating -swelling of the hands and feet -tremors -unusually weak or tired Side effects that usually do not require medical attention (report to your doctor or health care professional if they continue or are bothersome): -constipation or diarrhea -headache This list may not describe all possible side effects. Call your doctor for medical advice about side effects. You may report side effects to FDA at 1-800-FDA-1088. Where should I keep my medicine? This drug is given in a hospital or clinic and will not be stored at home. NOTE: This sheet is a summary. It may not cover all possible information. If you have questions about this medicine, talk to your doctor, pharmacist, or health care provider.  2018 Elsevier/Gold Standard (2013-10-23 10:38:36) Carboplatin injection What is this medicine? CARBOPLATIN (KAR boe pla tin) is a chemotherapy drug. It targets fast dividing cells, like cancer cells, and causes these cells to die. This medicine is used to treat ovarian cancer and many other cancers. This medicine may be used for other purposes; ask your health care provider or pharmacist if you have questions. COMMON BRAND NAME(S): Paraplatin What should I tell my health care provider before I take this medicine? They need to know if you  have any of these conditions: -blood disorders -hearing problems -kidney disease -recent or ongoing radiation therapy -an unusual or allergic reaction to carboplatin, cisplatin, other chemotherapy, other medicines, foods, dyes, or preservatives -pregnant or trying to get pregnant -breast-feeding How should I use this medicine? This drug is usually given as an infusion into a vein. It is administered in a hospital or clinic by a specially trained health care professional. Talk to your pediatrician regarding the use of this medicine in children. Special care may be needed. Overdosage: If you think you have taken too much of this medicine contact a poison control center or emergency room at once. NOTE: This medicine is only for you. Do not share this medicine with others. What if I miss a dose? It is important not to miss a dose. Call your doctor or health care professional if you are unable to keep an appointment. What may interact with this medicine? -medicines for seizures -medicines to increase blood counts like filgrastim, pegfilgrastim, sargramostim -some antibiotics like amikacin, gentamicin, neomycin, streptomycin, tobramycin -vaccines Talk to your doctor or health care professional before taking any of these medicines: -  acetaminophen -aspirin -ibuprofen -ketoprofen -naproxen This list may not describe all possible interactions. Give your health care provider a list of all the medicines, herbs, non-prescription drugs, or dietary supplements you use. Also tell them if you smoke, drink alcohol, or use illegal drugs. Some items may interact with your medicine. What should I watch for while using this medicine? Your condition will be monitored carefully while you are receiving this medicine. You will need important blood work done while you are taking this medicine. This drug may make you feel generally unwell. This is not uncommon, as chemotherapy can affect healthy cells as well as  cancer cells. Report any side effects. Continue your course of treatment even though you feel ill unless your doctor tells you to stop. In some cases, you may be given additional medicines to help with side effects. Follow all directions for their use. Call your doctor or health care professional for advice if you get a fever, chills or sore throat, or other symptoms of a cold or flu. Do not treat yourself. This drug decreases your body's ability to fight infections. Try to avoid being around people who are sick. This medicine may increase your risk to bruise or bleed. Call your doctor or health care professional if you notice any unusual bleeding. Be careful brushing and flossing your teeth or using a toothpick because you may get an infection or bleed more easily. If you have any dental work done, tell your dentist you are receiving this medicine. Avoid taking products that contain aspirin, acetaminophen, ibuprofen, naproxen, or ketoprofen unless instructed by your doctor. These medicines may hide a fever. Do not become pregnant while taking this medicine. Women should inform their doctor if they wish to become pregnant or think they might be pregnant. There is a potential for serious side effects to an unborn child. Talk to your health care professional or pharmacist for more information. Do not breast-feed an infant while taking this medicine. What side effects may I notice from receiving this medicine? Side effects that you should report to your doctor or health care professional as soon as possible: -allergic reactions like skin rash, itching or hives, swelling of the face, lips, or tongue -signs of infection - fever or chills, cough, sore throat, pain or difficulty passing urine -signs of decreased platelets or bleeding - bruising, pinpoint red spots on the skin, black, tarry stools, nosebleeds -signs of decreased red blood cells - unusually weak or tired, fainting spells,  lightheadedness -breathing problems -changes in hearing -changes in vision -chest pain -high blood pressure -low blood counts - This drug may decrease the number of white blood cells, red blood cells and platelets. You may be at increased risk for infections and bleeding. -nausea and vomiting -pain, swelling, redness or irritation at the injection site -pain, tingling, numbness in the hands or feet -problems with balance, talking, walking -trouble passing urine or change in the amount of urine Side effects that usually do not require medical attention (report to your doctor or health care professional if they continue or are bothersome): -hair loss -loss of appetite -metallic taste in the mouth or changes in taste This list may not describe all possible side effects. Call your doctor for medical advice about side effects. You may report side effects to FDA at 1-800-FDA-1088. Where should I keep my medicine? This drug is given in a hospital or clinic and will not be stored at home. NOTE: This sheet is a summary. It may not cover all  possible information. If you have questions about this medicine, talk to your doctor, pharmacist, or health care provider.  2018 [employer]vier/Gold Standard (2008-03-23 14:38:05) Paclitaxel injection What is this medicine? PACLITAXEL (PAK li TAX el) is a chemotherapy drug. It targets fast dividing cells, like cancer cells, and causes these cells to die. This medicine is used to treat ovarian cancer, breast cancer, and other cancers. This medicine may be used for other purposes; ask your health care provider or pharmacist if you have questions. COMMON BRAND NAME(S): Onxol, Taxol What should I tell my health care provider before I take this medicine? They need to know if you have any of these conditions: -blood disorders -irregular heartbeat -infection (especially a virus infection such as chickenpox, cold sores, or herpes) -liver disease -previous or ongoing  radiation therapy -an unusual or allergic reaction to paclitaxel, alcohol, polyoxyethylated castor oil, other chemotherapy agents, other medicines, foods, dyes, or preservatives -pregnant or trying to get pregnant -breast-feeding How should I use this medicine? This drug is given as an infusion into a vein. It is administered in a hospital or clinic by a specially trained health care professional. Talk to your pediatrician regarding the use of this medicine in children. Special care may be needed. Overdosage: If you think you have taken too much of this medicine contact a poison control center or emergency room at once. NOTE: This medicine is only for you. Do not share this medicine with others. What if I miss a dose? It is important not to miss your dose. Call your doctor or health care professional if you are unable to keep an appointment. What may interact with this medicine? Do not take this medicine with any of the following medications: -disulfiram -metronidazole This medicine may also interact with the following medications: -cyclosporine -diazepam -ketoconazole -medicines to increase blood counts like filgrastim, pegfilgrastim, sargramostim -other chemotherapy drugs like cisplatin, doxorubicin, epirubicin, etoposide, teniposide, vincristine -quinidine -testosterone -vaccines -verapamil Talk to your doctor or health care professional before taking any of these medicines: -acetaminophen -aspirin -ibuprofen -ketoprofen -naproxen This list may not describe all possible interactions. Give your health care provider a list of all the medicines, herbs, non-prescription drugs, or dietary supplements you use. Also tell them if you smoke, drink alcohol, or use illegal drugs. Some items may interact with your medicine. What should I watch for while using this medicine? Your condition will be monitored carefully while you are receiving this medicine. You will need important blood work  done while you are taking this medicine. This medicine can cause serious allergic reactions. To reduce your risk you will need to take other medicine(s) before treatment with this medicine. If you experience allergic reactions like skin rash, itching or hives, swelling of the face, lips, or tongue, tell your doctor or health care professional right away. In some cases, you may be given additional medicines to help with side effects. Follow all directions for their use. This drug may make you feel generally unwell. This is not uncommon, as chemotherapy can affect healthy cells as well as cancer cells. Report any side effects. Continue your course of treatment even though you feel ill unless your doctor tells you to stop. Call your doctor or health care professional for advice if you get a fever, chills or sore throat, or other symptoms of a cold or flu. Do not treat yourself. This drug decreases your body's ability to fight infections. Try to avoid being around people who are sick. This medicine may increase your risk  to bruise or bleed. Call your doctor or health care professional if you notice any unusual bleeding. Be careful brushing and flossing your teeth or using a toothpick because you may get an infection or bleed more easily. If you have any dental work done, tell your dentist you are receiving this medicine. Avoid taking products that contain aspirin, acetaminophen, ibuprofen, naproxen, or ketoprofen unless instructed by your doctor. These medicines may hide a fever. Do not become pregnant while taking this medicine. Women should inform their doctor if they wish to become pregnant or think they might be pregnant. There is a potential for serious side effects to an unborn child. Talk to your health care professional or pharmacist for more information. Do not breast-feed an infant while taking this medicine. Men are advised not to father a child while receiving this medicine. This product may  contain alcohol. Ask your pharmacist or healthcare provider if this medicine contains alcohol. Be sure to tell all healthcare providers you are taking this medicine. Certain medicines, like metronidazole and disulfiram, can cause an unpleasant reaction when taken with alcohol. The reaction includes flushing, headache, nausea, vomiting, sweating, and increased thirst. The reaction can last from 30 minutes to several hours. What side effects may I notice from receiving this medicine? Side effects that you should report to your doctor or health care professional as soon as possible: -allergic reactions like skin rash, itching or hives, swelling of the face, lips, or tongue -low blood counts - This drug may decrease the number of white blood cells, red blood cells and platelets. You may be at increased risk for infections and bleeding. -signs of infection - fever or chills, cough, sore throat, pain or difficulty passing urine -signs of decreased platelets or bleeding - bruising, pinpoint red spots on the skin, black, tarry stools, nosebleeds -signs of decreased red blood cells - unusually weak or tired, fainting spells, lightheadedness -breathing problems -chest pain -high or low blood pressure -mouth sores -nausea and vomiting -pain, swelling, redness or irritation at the injection site -pain, tingling, numbness in the hands or feet -slow or irregular heartbeat -swelling of the ankle, feet, hands Side effects that usually do not require medical attention (report to your doctor or health care professional if they continue or are bothersome): -bone pain -complete hair loss including hair on your head, underarms, pubic hair, eyebrows, and eyelashes -changes in the color of fingernails -diarrhea -loosening of the fingernails -loss of appetite -muscle or joint pain -red flush to skin -sweating This list may not describe all possible side effects. Call your doctor for medical advice about side  effects. You may report side effects to FDA at 1-800-FDA-1088. Where should I keep my medicine? This drug is given in a hospital or clinic and will not be stored at home. NOTE: This sheet is a summary. It may not cover all possible information. If you have questions about this medicine, talk to your doctor, pharmacist, or health care provider.  2018 Elsevier/Gold Standard (2015-10-18 19:58:00)

## 2018-09-04 ENCOUNTER — Other Ambulatory Visit: Payer: Self-pay

## 2018-09-04 ENCOUNTER — Ambulatory Visit
Admission: RE | Admit: 2018-09-04 | Discharge: 2018-09-04 | Disposition: A | Discharge status: Home / Self Care | Payer: Medicare Other | Source: Ambulatory Visit | Attending: Radiation Oncology | Admitting: Radiation Oncology

## 2018-09-04 ENCOUNTER — Other Ambulatory Visit: Payer: Self-pay | Admitting: Radiation Oncology

## 2018-09-04 ENCOUNTER — Encounter (HOSPITAL_COMMUNITY): Payer: Medicare Other

## 2018-09-04 ENCOUNTER — Ambulatory Visit (HOSPITAL_COMMUNITY)
Admission: RE | Admit: 2018-09-04 | Discharge: 2018-09-04 | Disposition: A | Discharge status: Home / Self Care | Payer: Medicare Other | Source: Ambulatory Visit | Attending: Radiation Oncology | Admitting: Radiation Oncology

## 2018-09-04 DIAGNOSIS — J929 Pleural plaque without asbestos: Secondary | ICD-10-CM | POA: Insufficient documentation

## 2018-09-04 DIAGNOSIS — C3431 Malignant neoplasm of lower lobe, right bronchus or lung: Secondary | ICD-10-CM | POA: Diagnosis not present

## 2018-09-04 DIAGNOSIS — J432 Centrilobular emphysema: Secondary | ICD-10-CM | POA: Insufficient documentation

## 2018-09-04 DIAGNOSIS — N400 Enlarged prostate without lower urinary tract symptoms: Secondary | ICD-10-CM | POA: Insufficient documentation

## 2018-09-04 DIAGNOSIS — C3490 Malignant neoplasm of unspecified part of unspecified bronchus or lung: Secondary | ICD-10-CM | POA: Diagnosis present

## 2018-09-04 DIAGNOSIS — Z79899 Other long term (current) drug therapy: Secondary | ICD-10-CM | POA: Insufficient documentation

## 2018-09-04 DIAGNOSIS — J320 Chronic maxillary sinusitis: Secondary | ICD-10-CM | POA: Insufficient documentation

## 2018-09-04 DIAGNOSIS — R918 Other nonspecific abnormal finding of lung field: Secondary | ICD-10-CM | POA: Diagnosis not present

## 2018-09-04 LAB — GLUCOSE, CAPILLARY: Glucose-Capillary: 146 mg/dL — ABNORMAL HIGH (ref 70–99)

## 2018-09-04 MED ORDER — SUCRALFATE 1 G PO TABS: 1.0000 g | ORAL_TABLET | Freq: Three times a day (TID) | ORAL | 1 refills | Status: DC

## 2018-09-04 MED ORDER — OXYCODONE HCL 10 MG PO TABS: 10.0000 mg | ORAL_TABLET | Freq: Four times a day (QID) | ORAL | 0 refills | Status: DC | PRN

## 2018-09-04 MED ORDER — FLUDEOXYGLUCOSE F - 18 (FDG) INJECTION
6.7500 | Freq: Once | INTRAVENOUS | Status: AC | PRN
  Administered 2018-09-04: 6.75 via INTRAVENOUS

## 2018-09-04 NOTE — Telephone Encounter (Signed)
Contacted pt re: 1st time Taxol/Carbo yesterday. Very difficult to understand patient via phone. Pt denies nausea/vomiting/ bowel changes. Questioned about appetite and po intake. Pt seemed to try to verbalize difficulty eating and drinking since beginning radiation therapy. Pt and daughter are in route to XRT as call was being made. Instructed pt and daughter to express concerns to XRT in person for sx management due to inability to understand pt on phone. Aware to contact office if radiation oncologist is unable to address or feels sx are chemo related.   Routed script for oxycodone to Dr Marin Olp for review. Pt has been unable to get OxyContin filled d/t insurance denial. Currently in appeals process. dph

## 2018-09-05 ENCOUNTER — Other Ambulatory Visit: Payer: Self-pay | Admitting: Family

## 2018-09-05 ENCOUNTER — Other Ambulatory Visit: Payer: Self-pay | Admitting: Urology

## 2018-09-05 ENCOUNTER — Ambulatory Visit
Admission: RE | Admit: 2018-09-05 | Discharge: 2018-09-05 | Disposition: A | Discharge status: Home / Self Care | Payer: Medicare Other | Source: Ambulatory Visit | Attending: Radiation Oncology | Admitting: Radiation Oncology

## 2018-09-05 DIAGNOSIS — C3490 Malignant neoplasm of unspecified part of unspecified bronchus or lung: Secondary | ICD-10-CM

## 2018-09-05 DIAGNOSIS — R042 Hemoptysis: Secondary | ICD-10-CM

## 2018-09-05 DIAGNOSIS — C3491 Malignant neoplasm of unspecified part of right bronchus or lung: Secondary | ICD-10-CM

## 2018-09-05 DIAGNOSIS — J9859 Other diseases of mediastinum, not elsewhere classified: Secondary | ICD-10-CM

## 2018-09-05 DIAGNOSIS — C3431 Malignant neoplasm of lower lobe, right bronchus or lung: Secondary | ICD-10-CM | POA: Diagnosis not present

## 2018-09-05 MED ORDER — OXYCODONE ER 18 MG PO C12A: 18.0000 mg | EXTENDED_RELEASE_CAPSULE | Freq: Two times a day (BID) | ORAL | 0 refills | Status: DC

## 2018-09-05 MED ORDER — SUCRALFATE 1 G PO TABS: 1.0000 g | ORAL_TABLET | Freq: Three times a day (TID) | ORAL | 0 refills | Status: DC

## 2018-09-05 NOTE — Progress Notes (Signed)
Oxycontin CR not covered by his insurance. Spoke with [place] in Pharmacy and have changed to [facility] 18 mg Q 12 hours which is covered by Medicare. Script sent to his [facility].

## 2018-09-08 ENCOUNTER — Other Ambulatory Visit: Payer: Self-pay | Admitting: *Deleted

## 2018-09-08 ENCOUNTER — Ambulatory Visit: Payer: Medicare Other

## 2018-09-08 MED ORDER — MONTELUKAST SODIUM 10 MG PO TABS: 10.0000 mg | ORAL_TABLET | Freq: Every day | ORAL | 1 refills | Status: DC

## 2018-09-08 NOTE — Progress Notes (Signed)
  Radiation Oncology         [phone] ________________________________  Name: Ryan Houston MRN: [id number]  Date: 08/11/2018  DOB: 10-01-1946  SIMULATION AND TREATMENT PLANNING NOTE  DIAGNOSIS:     ICD-10-CM   1. Primary malignant neoplasm of bronchus of right lower lobe (HCC) C34.31      Site:  Right lung  NARRATIVE:  The patient was brought to the [facility].  Identity was confirmed.  All relevant records and images related to the planned course of therapy were reviewed.   Written consent to proceed with treatment was confirmed which was freely given after reviewing the details related to the planned course of therapy had been reviewed with the patient.  Then, the patient was set-up in a stable reproducible  supine position for radiation therapy.  CT images were obtained.  Surface markings were placed.    Medically necessary complex treatment device(s) for immobilization:  Vac-lock bag.   The CT images were loaded into the planning software.  Then the target and avoidance structures were contoured.  Treatment planning then occurred.  The radiation prescription was entered and confirmed.  A total of 3 complex treatment devices were fabricated which relate to the designed radiation treatment fields. Each of these customized fields/ complex treatment devices will be used on a daily basis during the radiation course. I have requested : 3D Simulation  I have requested a DVH of the following structures: Target volume, lungs, esophagus, spinal cord.   The patient will undergo daily image guidance to ensure accurate localization of the target, and adequate minimize dose to the normal surrounding structures in close proximity to the target.   PLAN:  The patient will initially receive 4 Gy in 2 fractions to treat the patient on an urgent basis.  The patient then will continue with a 3D conformal treatment plan to a dose in total of about 66 Gy in 6-1/2 weeks.Marland Kitchen  Special  treatment procedure The patient will also receive concurrent chemotherapy during the treatment. The patient may therefore experience increased toxicity or side effects and the patient will be monitored for such problems. This may require extra lab work as necessary. This therefore constitutes a special treatment procedure.   ________________________________   Jodelle Gross, MD, PhD

## 2018-09-08 NOTE — Addendum Note (Signed)
Encounter addended by: Kyung Rudd, MD on: 09/08/2018 11:22 AM  Actions taken: Problem List modified, Visit diagnoses modified, Pend clinical note, Sign clinical note

## 2018-09-09 ENCOUNTER — Ambulatory Visit
Admission: RE | Admit: 2018-09-09 | Discharge: 2018-09-09 | Disposition: A | Discharge status: Home / Self Care | Payer: Medicare Other | Source: Ambulatory Visit | Attending: Radiation Oncology | Admitting: Radiation Oncology

## 2018-09-09 ENCOUNTER — Other Ambulatory Visit: Payer: Self-pay | Admitting: *Deleted

## 2018-09-09 DIAGNOSIS — C3431 Malignant neoplasm of lower lobe, right bronchus or lung: Secondary | ICD-10-CM | POA: Diagnosis not present

## 2018-09-10 ENCOUNTER — Other Ambulatory Visit: Payer: Self-pay

## 2018-09-10 ENCOUNTER — Inpatient Hospital Stay (HOSPITAL_BASED_OUTPATIENT_CLINIC_OR_DEPARTMENT_OTHER): Payer: Medicare Other | Admitting: Hematology & Oncology

## 2018-09-10 ENCOUNTER — Inpatient Hospital Stay: Payer: Medicare Other

## 2018-09-10 ENCOUNTER — Encounter: Payer: Self-pay | Admitting: Hematology & Oncology

## 2018-09-10 ENCOUNTER — Encounter: Payer: Self-pay | Admitting: Nutrition

## 2018-09-10 ENCOUNTER — Telehealth: Payer: Self-pay

## 2018-09-10 ENCOUNTER — Ambulatory Visit
Admission: RE | Admit: 2018-09-10 | Discharge: 2018-09-10 | Disposition: A | Discharge status: Home / Self Care | Payer: Medicare Other | Source: Ambulatory Visit | Attending: Radiation Oncology | Admitting: Radiation Oncology

## 2018-09-10 VITALS — BP 114/60 | HR 109 | Resp 18

## 2018-09-10 VITALS — BP 112/65 | HR 110 | Temp 98.1°F | Resp 18 | Wt 130.0 lb

## 2018-09-10 DIAGNOSIS — C3431 Malignant neoplasm of lower lobe, right bronchus or lung: Secondary | ICD-10-CM

## 2018-09-10 DIAGNOSIS — B1920 Unspecified viral hepatitis C without hepatic coma: Secondary | ICD-10-CM | POA: Diagnosis not present

## 2018-09-10 DIAGNOSIS — R918 Other nonspecific abnormal finding of lung field: Secondary | ICD-10-CM

## 2018-09-10 DIAGNOSIS — Z79899 Other long term (current) drug therapy: Secondary | ICD-10-CM | POA: Diagnosis not present

## 2018-09-10 DIAGNOSIS — Z5111 Encounter for antineoplastic chemotherapy: Secondary | ICD-10-CM | POA: Diagnosis not present

## 2018-09-10 DIAGNOSIS — Z95828 Presence of other vascular implants and grafts: Secondary | ICD-10-CM

## 2018-09-10 LAB — CBC WITH DIFFERENTIAL (CANCER CENTER ONLY)
BASOS PCT: 0 %
Basophils Absolute: 0 10*3/uL (ref 0.0–0.1)
EOS ABS: 0.1 10*3/uL (ref 0.0–0.5)
Eosinophils Relative: 1 %
HEMATOCRIT: 36.5 % — AB (ref 38.7–49.9)
Hemoglobin: 12.2 g/dL — ABNORMAL LOW (ref 13.0–17.1)
LYMPHS ABS: 0.3 10*3/uL — AB (ref 0.9–3.3)
Lymphocytes Relative: 7 %
MCH: 31 pg (ref 28.0–33.4)
MCHC: 33.4 g/dL (ref 32.0–35.9)
MCV: 92.9 fL (ref 82.0–98.0)
MONO ABS: 0.2 10*3/uL (ref 0.1–0.9)
MONOS PCT: 5 %
Neutro Abs: 3.9 10*3/uL (ref 1.5–6.5)
Neutrophils Relative %: 87 %
Platelet Count: 143 10*3/uL — ABNORMAL LOW (ref 145–400)
RBC: 3.93 MIL/uL — ABNORMAL LOW (ref 4.20–5.70)
RDW: 13.1 % (ref 11.1–15.7)
WBC Count: 4.6 10*3/uL (ref 4.0–10.0)

## 2018-09-10 LAB — CMP (CANCER CENTER ONLY)
ALBUMIN: 2.7 g/dL — AB (ref 3.5–5.0)
ALT: 138 U/L — ABNORMAL HIGH (ref 10–47)
AST: 144 U/L — AB (ref 11–38)
Alkaline Phosphatase: 74 U/L (ref 26–84)
Anion gap: 0 — ABNORMAL LOW (ref 5–15)
BUN: 21 mg/dL (ref 7–22)
CALCIUM: 8.6 mg/dL (ref 8.0–10.3)
CHLORIDE: 106 mmol/L (ref 98–108)
CO2: 28 mmol/L (ref 18–33)
Creatinine: 1.3 mg/dL — ABNORMAL HIGH (ref 0.60–1.20)
GLUCOSE: 181 mg/dL — AB (ref 73–118)
Potassium: 3.7 mmol/L (ref 3.3–4.7)
SODIUM: 132 mmol/L (ref 128–145)
Total Bilirubin: 0.7 mg/dL (ref 0.2–1.6)
Total Protein: 8.4 g/dL — ABNORMAL HIGH (ref 6.4–8.1)

## 2018-09-10 MED ORDER — SODIUM CHLORIDE 0.9 % IV SOLN
INTRAVENOUS | Status: DC
  Administered 2018-09-10: 11:00:00 via INTRAVENOUS
  Filled 2018-09-10 (×2): qty 250

## 2018-09-10 MED ORDER — DIPHENHYDRAMINE HCL 50 MG/ML IJ SOLN
INTRAMUSCULAR | Status: AC
  Filled 2018-09-10: qty 1

## 2018-09-10 MED ORDER — HEPARIN SOD (PORK) LOCK FLUSH 100 UNIT/ML IV SOLN
500.0000 [IU] | Freq: Once | INTRAVENOUS | Status: AC
  Administered 2018-09-10: 500 [IU] via INTRAVENOUS
  Filled 2018-09-10: qty 5

## 2018-09-10 MED ORDER — FAMOTIDINE IN NACL 20-0.9 MG/50ML-% IV SOLN
INTRAVENOUS | Status: AC
  Filled 2018-09-10: qty 50

## 2018-09-10 MED ORDER — SODIUM CHLORIDE 0.9 % IV SOLN: 40.0000 mg | Freq: Once | INTRAVENOUS | Status: DC

## 2018-09-10 MED ORDER — SODIUM CHLORIDE 0.9% FLUSH
10.0000 mL | INTRAVENOUS | Status: DC | PRN
  Administered 2018-09-10: 10 mL via INTRAVENOUS
  Filled 2018-09-10: qty 10

## 2018-09-10 MED ORDER — SODIUM CHLORIDE 0.9 % IV SOLN
40.0000 mg | Freq: Once | INTRAVENOUS | Status: AC
  Administered 2018-09-10: 40 mg via INTRAVENOUS
  Filled 2018-09-10: qty 4

## 2018-09-10 MED ORDER — TRAMADOL HCL 50 MG PO TABS: 100.0000 mg | ORAL_TABLET | Freq: Four times a day (QID) | ORAL | 0 refills | Status: DC | PRN

## 2018-09-10 MED ORDER — PALONOSETRON HCL INJECTION 0.25 MG/5ML
INTRAVENOUS | Status: AC
  Filled 2018-09-10: qty 5

## 2018-09-10 MED ORDER — SODIUM CHLORIDE 0.9 % IV SOLN
20.0000 mg | Freq: Once | INTRAVENOUS | Status: AC
  Administered 2018-09-10: 20 mg via INTRAVENOUS
  Filled 2018-09-10: qty 2

## 2018-09-10 NOTE — Patient Instructions (Signed)
Dehydration, Adult Dehydration is a condition in which there is not enough fluid or water in the body. This happens when you lose more fluids than you take in. Important organs, such as the kidneys, brain, and heart, cannot function without a proper amount of fluids. Any loss of fluids from the body can lead to dehydration. Dehydration can range from mild to severe. This condition should be treated right away to prevent it from becoming severe. What are the causes? This condition may be caused by:  Vomiting.  Diarrhea.  Excessive sweating, such as from heat exposure or exercise.  Not drinking enough fluid, especially: ? When ill. ? While doing activity that requires a lot of energy.  Excessive urination.  Fever.  Infection.  Certain medicines, such as medicines that cause the body to lose excess fluid (diuretics).  Inability to access safe drinking water.  Reduced physical ability to get adequate water and food.  What increases the risk? This condition is more likely to develop in people:  Who have a poorly controlled long-term (chronic) illness, such as diabetes, heart disease, or kidney disease.  Who are age 65 or older.  Who are disabled.  Who live in a place with high altitude.  Who play endurance sports.  What are the signs or symptoms? Symptoms of mild dehydration may include:  Thirst.  Dry lips.  Slightly dry mouth.  Dry, warm skin.  Dizziness. Symptoms of moderate dehydration may include:  Very dry mouth.  Muscle cramps.  Dark urine. Urine may be the color of tea.  Decreased urine production.  Decreased tear production.  Heartbeat that is irregular or faster than normal (palpitations).  Headache.  Light-headedness, especially when you stand up from a sitting position.  Fainting (syncope). Symptoms of severe dehydration may include:  Changes in skin, such as: ? Cold and clammy skin. ? Blotchy (mottled) or pale skin. ? Skin that does  not quickly return to normal after being lightly pinched and released (poor skin turgor).  Changes in body fluids, such as: ? Extreme thirst. ? No tear production. ? Inability to sweat when body temperature is high, such as in hot weather. ? Very little urine production.  Changes in vital signs, such as: ? Weak pulse. ? Pulse that is more than 100 beats a minute when sitting still. ? Rapid breathing. ? Low blood pressure.  Other changes, such as: ? Sunken eyes. ? Cold hands and feet. ? Confusion. ? Lack of energy (lethargy). ? Difficulty waking up from sleep. ? Short-term weight loss. ? Unconsciousness. How is this diagnosed? This condition is diagnosed based on your symptoms and a physical exam. Blood and urine tests may be done to help confirm the diagnosis. How is this treated? Treatment for this condition depends on the severity. Mild or moderate dehydration can often be treated at home. Treatment should be started right away. Do not wait until dehydration becomes severe. Severe dehydration is an emergency and it needs to be treated in a hospital. Treatment for mild dehydration may include:  Drinking more fluids.  Replacing salts and minerals in your blood (electrolytes) that you may have lost. Treatment for moderate dehydration may include:  Drinking an oral rehydration solution (ORS). This is a drink that helps you replace fluids and electrolytes (rehydrate). It can be found at pharmacies and retail stores. Treatment for severe dehydration may include:  Receiving fluids through an IV tube.  Receiving an electrolyte solution through a feeding tube that is passed through your nose   and into your stomach (nasogastric tube, or NG tube).  Correcting any abnormalities in electrolytes.  Treating the underlying cause of dehydration. Follow these instructions at home:  If directed by your health care provider, drink an ORS: ? Make an ORS by following instructions on the  package. ? Start by drinking small amounts, about  cup (120 mL) every 5-10 minutes. ? Slowly increase how much you drink until you have taken the amount recommended by your health care provider.  Drink enough clear fluid to keep your urine clear or pale yellow. If you were told to drink an ORS, finish the ORS first, then start slowly drinking other clear fluids. Drink fluids such as: ? Water. Do not drink only water. Doing that can lead to having too little salt (sodium) in the body (hyponatremia). ? Ice chips. ? Fruit juice that you have added water to (diluted fruit juice). ? Low-calorie sports drinks.  Avoid: ? Alcohol. ? Drinks that contain a lot of sugar. These include high-calorie sports drinks, fruit juice that is not diluted, and soda. ? Caffeine. ? Foods that are greasy or contain a lot of fat or sugar.  Take over-the-counter and prescription medicines only as told by your health care provider.  Do not take sodium tablets. This can lead to having too much sodium in the body (hypernatremia).  Eat foods that contain a healthy balance of electrolytes, such as bananas, oranges, potatoes, tomatoes, and spinach.  Keep all follow-up visits as told by your health care provider. This is important. Contact a health care provider if:  You have abdominal pain that: ? Gets worse. ? Stays in one area (localizes).  You have a rash.  You have a stiff neck.  You are more irritable than usual.  You are sleepier or more difficult to wake up than usual.  You feel weak or dizzy.  You feel very thirsty.  You have urinated only a small amount of very dark urine over 6-8 hours. Get help right away if:  You have symptoms of severe dehydration.  You cannot drink fluids without vomiting.  Your symptoms get worse with treatment.  You have a fever.  You have a severe headache.  You have vomiting or diarrhea that: ? Gets worse. ? Does not go away.  You have blood or green matter  (bile) in your vomit.  You have blood in your stool. This may cause stool to look black and tarry.  You have not urinated in 6-8 hours.  You faint.  Your heart rate while sitting still is over 100 beats a minute.  You have trouble breathing. This information is not intended to replace advice given to you by your health care provider. Make sure you discuss any questions you have with your health care provider. Document Released: 12/17/2005 Document Revised: 07/13/2016 Document Reviewed: 02/10/2016 Elsevier Interactive Patient Education  2018 Elsevier Inc.  

## 2018-09-10 NOTE — Progress Notes (Signed)
Patient and family member stopped me in the elevator at the cancer center requesting ensure samples. Patient is not eating well. He is a patient of Dr. Lisbeth Renshaw and has an RD appointment on Sept 17. Provided a copy of fact sheets entitled increasing calories and protein, soft protein foods and 1 complimentary case of ensure Enlive.

## 2018-09-10 NOTE — Telephone Encounter (Signed)
Per 9/11 patient requested a appointment with [facility] and it was okayed by Lenna Sciara D. To schedule. Left a voice message for patient will also mail a letter, of this appointment

## 2018-09-11 ENCOUNTER — Ambulatory Visit: Payer: Medicare Other

## 2018-09-11 ENCOUNTER — Telehealth: Payer: Self-pay | Admitting: *Deleted

## 2018-09-11 ENCOUNTER — Other Ambulatory Visit: Payer: Self-pay | Admitting: *Deleted

## 2018-09-11 MED ORDER — OXYCODONE HCL 10 MG PO TABS: 10.0000 mg | ORAL_TABLET | Freq: Four times a day (QID) | ORAL | 0 refills | Status: DC | PRN

## 2018-09-11 NOTE — Telephone Encounter (Signed)
Patient's friend states that this office needs to call the pharmacy to give okay for patient to fill oxycodone 2 days early. She states Judson Roch told them that he could take whenever he needed it. Educated the friend that patient can take the oxycodone every 6 hours as needed and not anymore frequently. This office would give the okay this time for patient to have an early fill, but going forward patient needs to take his medication as prescribed. She was in agreement.   She also mentions that yesterday, the patient had one stool in which he passed a blood clot. It hasn't happened again. She wants to know what they should do.   Reviewed with Laverna Peace NP and she would like patient to observe for continued episodes. He is to call the office with any bright red bleeding or continuous passage of blood with stool.

## 2018-09-11 NOTE — Progress Notes (Signed)
Hematology and Oncology Follow Up Visit  Ryan Houston [id number] 1946/10/22 72 y.o. 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  Unfortunately, I think it is apparent that he is not going to tolerate combination of radiation and weekly low-dose chemotherapy.  He just is losing more weight.  He is really not eating much.  I just feel that further chemotherapy is not going to benefit him and could cause more problems for him.  Unfortunately part of the problem is that insurance will not help with his medications.  They had a hard time with his pain medications.  He is not on Marinol to help with his appetite.  I will try to send in some Megace to see if this will help.  I know that he is trying his best.  I just feel bad that he is having these issues.  Not unexpected, is that the hepatitis C is not being treated.  He is not even seen the gastroenterologist or infectious disease for this yet.  He is not coughing up any blood.  I feel that what he has had so far is helping him.  He has had no diarrhea.  He has had no leg swelling.  He has had no headache.  Overall, I said his performance status is ECOG 3.  Medications:  Current Outpatient Medications:  .  albuterol (PROVENTIL HFA;VENTOLIN HFA) 108 (90 BASE) MCG/ACT inhaler, Inhale 1-2 puffs into the lungs every 6 (six) hours as needed for wheezing., Disp: 1 Inhaler, Rfl: 0 .  Ascorbic Acid (VITAMIN C) 1000 MG tablet, Take 1,000 mg by mouth every other day., Disp: , Rfl:  .  azelastine (ASTELIN) 0.1 % nasal spray, Place into both nostrils 2 (two) times daily. Use in each nostril as directed, Disp: , Rfl:  .  benzonatate (TESSALON) 100 MG capsule, Take 1 capsule (100 mg total) by mouth 3 (three) times daily as needed  for cough (cough)., Disp: 90 capsule, Rfl: 0 .  Cholecalciferol (VITAMIN D-3) 1000 UNITS CAPS, Take 1,000 Units by mouth daily. , Disp: , Rfl:  .  dexamethasone (DECADRON) 4 MG tablet, Take 2 tablets (8 mg total) by mouth daily. Start the day after chemotherapy for 2 days., Disp: 30 tablet, Rfl: 1 .  dronabinol (MARINOL) 5 MG capsule, Take 1 capsule (5 mg total) by mouth 2 (two) times daily before a meal., Disp: 60 capsule, Rfl: 0 .  feeding supplement, ENSURE ENLIVE, (ENSURE ENLIVE) LIQD, Take 237 mLs by mouth 3 (three) times daily between meals., Disp: 237 mL, Rfl: 12 .  gabapentin (NEURONTIN) 300 MG capsule, Take 300 mg by mouth 3 (three) times daily., Disp: , Rfl:  .  hydrOXYzine (ATARAX/VISTARIL) 25 MG tablet, Take 1 tablet (25 mg total) by mouth every 6 (six) hours., Disp: 30 tablet, Rfl: 0 .  ipratropium-albuterol (DUONEB) 0.5-2.5 (3) MG/3ML SOLN, Take 3 mLs by nebulization 3 (three) times daily., Disp: 360 mL, Rfl: 2 .  ipratropium-albuterol (DUONEB) 0.5-2.5 (3) MG/3ML SOLN, Inhale 3 mLs into the lungs every 4 (four) hours as needed., Disp: 360 mL, Rfl: 2 .  lidocaine-prilocaine (EMLA) cream, Apply to affected area once, Disp: 30 g, Rfl: 3 .  LORazepam (ATIVAN) 0.5 MG tablet, Take 1 tablet (0.5 mg total)  by mouth every 6 (six) hours as needed (Nausea or vomiting)., Disp: 30 tablet, Rfl: 0 .  mirabegron ER (MYRBETRIQ) 25 MG TB24 tablet, Take 1 tablet (25 mg total) by mouth daily., Disp: 30 tablet, Rfl: 0 .  montelukast (SINGULAIR) 10 MG tablet, Take 1 tablet (10 mg total) by mouth at bedtime., Disp: 90 tablet, Rfl: 1 .  nicotine (NICODERM CQ - DOSED IN MG/24 HOURS) 21 mg/24hr patch, Place 1 patch (21 mg total) onto the skin daily., Disp: 28 patch, Rfl: 0 .  Omega-3 Fatty Acids (FISH OIL PO), Take 1 capsule by mouth every other day., Disp: , Rfl:  .  omeprazole (PRILOSEC) 20 MG capsule, Take 1 capsule (20 mg total) by mouth daily., Disp: 30 capsule, Rfl: 0 .  ondansetron (ZOFRAN) 4 MG tablet,  Take 1 tablet (4 mg total) by mouth every 6 (six) hours as needed for nausea., Disp: 20 tablet, Rfl: 0 .  ondansetron (ZOFRAN) 8 MG tablet, Take 1 tablet (8 mg total) by mouth 2 (two) times daily as needed for refractory nausea / vomiting. Start on day 3 after chemo., Disp: 30 tablet, Rfl: 1 .  oxyCODONE ER (XTAMPZA ER) 18 MG C12A, Take 18 mg by mouth every 12 (twelve) hours., Disp: 60 each, Rfl: 0 .  Oxycodone HCl 10 MG TABS, Take 1 tablet (10 mg total) by mouth every 6 (six) hours as needed., Disp: 90 tablet, Rfl: 0 .  polyethylene glycol (MIRALAX / GLYCOLAX) packet, Take 17 g by mouth 2 (two) times daily., Disp: 100 each, Rfl: 4 .  prochlorperazine (COMPAZINE) 10 MG tablet, Take 1 tablet (10 mg total) by mouth every 6 (six) hours as needed for nausea or vomiting., Disp: 385 tablet, Rfl: 1 .  senna-docusate (SENOKOT-S) 8.6-50 MG tablet, Take 1 tablet by mouth 2 (two) times daily., Disp: , Rfl:  .  sucralfate (CARAFATE) 1 g tablet, Take 1 tablet (1 g total) by mouth 4 (four) times daily -  with meals and at bedtime. Crush and mix in 1 oz water, Disp: 360 tablet, Rfl: 0 .  traMADol (ULTRAM) 50 MG tablet, Take 2 tablets (100 mg total) by mouth every 6 (six) hours as needed., Disp: 90 tablet, Rfl: 0 .  vitamin B-12 (CYANOCOBALAMIN) 1000 MCG tablet, Take 1,000 mcg by mouth every other day., Disp: , Rfl:  .  VITAMIN E PO, Take 2 capsules by mouth every other day., Disp: , Rfl:   Current Facility-Administered Medications:  .  0.9 %  sodium chloride infusion, , Intravenous, Continuous, Marqueta Pulley, Rudell Cobb, MD, Stopped at 09/10/18 1407  Allergies: No Known Allergies  Past Medical History, Surgical history, Social history, and Family History were reviewed and updated.  Review of Systems: Review of Systems  Constitutional: Positive for appetite change and fatigue.  HENT:  Negative.   Eyes: Negative.   Respiratory: Positive for hemoptysis and shortness of breath.   Cardiovascular: Negative.     Gastrointestinal: Positive for constipation.  Endocrine: Negative.   Genitourinary: Negative.    Musculoskeletal: Positive for myalgias and neck pain.  Skin: Negative.   Neurological: Negative.   Hematological: Negative.   Psychiatric/Behavioral: Negative.     Physical Exam:  weight is 130 lb (59 kg). His oral temperature is 98.1 F (36.7 C). His blood pressure is 112/65 and his pulse is 110 (abnormal). His respiration is 18 and oxygen saturation is 99%.   Wt Readings from Last 3 Encounters:  09/10/18 130 lb (59 kg)  08/22/18 135 lb (61.2 kg)  08/13/18 133 lb 14.4 oz (60.7 kg)    Physical Exam  Constitutional: He is oriented to person, place, and time.  HENT:  Head: Normocephalic and atraumatic.  Mouth/Throat: Oropharynx is clear and moist.  Eyes: Pupils are equal, round, and reactive to light. EOM are normal.  Neck: Normal range of motion.  Cardiovascular: Normal rate, regular rhythm and normal heart sounds.  Pulmonary/Chest: Effort normal and breath sounds normal.  Abdominal: Soft. Bowel sounds are normal.  Musculoskeletal: Normal range of motion. He exhibits no edema, tenderness or deformity.  Lymphadenopathy:    He has no cervical adenopathy.  Neurological: He is alert and oriented to person, place, and time.  Skin: Skin is warm and dry. No rash noted. No erythema.  Psychiatric: He has a normal mood and affect. His behavior is normal. Judgment and thought content normal.  Vitals reviewed.    Lab Results  Component Value Date   WBC 4.6 09/10/2018   HGB 12.2 (L) 09/10/2018   HCT 36.5 (L) 09/10/2018   MCV 92.9 09/10/2018   PLT 143 (L) 09/10/2018     Chemistry      Component Value Date/Time   NA 132 09/10/2018 0925   K 3.7 09/10/2018 0925   CL 106 09/10/2018 0925   CO2 28 09/10/2018 0925   BUN 21 09/10/2018 0925   CREATININE 1.30 (H) 09/10/2018 0925   CREATININE 1.31 (H) 11/09/2015 1528      Component Value Date/Time   CALCIUM 8.6 09/10/2018 0925    ALKPHOS 74 09/10/2018 0925   AST 144 (H) 09/10/2018 0925   ALT 138 (H) 09/10/2018 0925   BILITOT 0.7 09/10/2018 0925      Impression and Plan: Ryan Houston is a 72 year old African-American male.  He has locally advanced, inoperable, bronchogenic carcinoma.  He is not operable from my point of view.  Again, we will have to hold on his chemotherapy.  I will give him some IV fluids.  We will see if this can help.  Since the radiation is doing most of the work, he will continue this.  I would like to see him back in about 3 weeks.  By then, he should be getting close to finishing his radiation.  It certainly would be nice to do combination therapy but again, his weight is not going to tolerate this and I feel that if we continued combination therapy that he would end up hospitalized in worse shape.  I spent about 40 minutes with the and his daughter.  70% of the time was spent face-to-face with them.  I counseled them and help coordinate his care and his treatments today.     Volanda Napoleon, MD 9/12/20195:23 PM

## 2018-09-12 ENCOUNTER — Encounter: Payer: Self-pay | Admitting: Hematology & Oncology

## 2018-09-12 ENCOUNTER — Ambulatory Visit: Payer: Medicare Other

## 2018-09-12 ENCOUNTER — Ambulatory Visit: Payer: Medicare Other | Admitting: Radiation Oncology

## 2018-09-15 ENCOUNTER — Ambulatory Visit
Admission: RE | Admit: 2018-09-15 | Discharge: 2018-09-15 | Disposition: A | Discharge status: Home / Self Care | Payer: Medicare Other | Source: Ambulatory Visit | Attending: Radiation Oncology | Admitting: Radiation Oncology

## 2018-09-15 DIAGNOSIS — C3431 Malignant neoplasm of lower lobe, right bronchus or lung: Secondary | ICD-10-CM | POA: Diagnosis not present

## 2018-09-16 ENCOUNTER — Ambulatory Visit
Admission: RE | Admit: 2018-09-16 | Discharge: 2018-09-16 | Disposition: A | Discharge status: Home / Self Care | Payer: Medicare Other | Source: Ambulatory Visit | Attending: Radiation Oncology | Admitting: Radiation Oncology

## 2018-09-16 ENCOUNTER — Other Ambulatory Visit: Payer: Self-pay | Admitting: *Deleted

## 2018-09-16 ENCOUNTER — Inpatient Hospital Stay: Payer: Medicare Other | Admitting: Nutrition

## 2018-09-16 DIAGNOSIS — C3431 Malignant neoplasm of lower lobe, right bronchus or lung: Secondary | ICD-10-CM

## 2018-09-16 NOTE — Progress Notes (Signed)
72 year old male diagnosed with lung cancer.  Past medical history includes hypertension, hepatitis C and GERD.  Medications include vitamin C, vitamin D3, Decadron, Marinol, omega-3 fatty acids, Prilosec, and Zofran.  Labs include glucose 181, creatinine 1.3, and albumin 2.7.  Height: 69-1/2 inches. Weight: 134.2 pounds. Usual body weight: 160 pounds in 2018. BMI: 19.53.  Patient reports he has been drinking 3-4 bottles of Ensure Enlive a day.  Reports he loves it. He denies nausea. He has coughed up some phlegm. Reports 1 large bloody bowel movement about 2 weeks ago. States he is constipated.  Nutrition diagnosis:  Unintended weight loss related to diagnosis of lung cancer and associated treatments as evidenced by 25 pound weight loss from usual body weight. (unsure of timeframe.)  Intervention: Patient educated to consume high-calorie high-protein foods at mealtimes. Encourage patient to drink Ensure Enlive between meals. Provided second complementary case of Ensure. Reviewed the importance of a bowel regimen and provided information on constipation. Questions were answered.  Teach back method used.  Contact information given.  Fact sheets provided.  Monitoring, evaluation, goals: Patient will tolerate adequate calories and protein to minimize weight loss.  Next visit: To be scheduled as needed.  **Disclaimer: This note was dictated with voice recognition software. Similar sounding words can inadvertently be transcribed and this note may contain transcription errors which may not have been corrected upon publication of note.**

## 2018-09-17 ENCOUNTER — Ambulatory Visit: Payer: Medicare Other

## 2018-09-17 ENCOUNTER — Inpatient Hospital Stay: Payer: Medicare Other

## 2018-09-17 DIAGNOSIS — C3431 Malignant neoplasm of lower lobe, right bronchus or lung: Secondary | ICD-10-CM | POA: Diagnosis not present

## 2018-09-18 ENCOUNTER — Ambulatory Visit: Payer: Medicare Other

## 2018-09-19 ENCOUNTER — Telehealth: Payer: Self-pay | Admitting: *Deleted

## 2018-09-19 ENCOUNTER — Ambulatory Visit: Payer: Medicare Other

## 2018-09-19 NOTE — Telephone Encounter (Signed)
Left the patient a voicemail regarding his treatment appointments.  Left my call back number for him to give me a call back.  Will continue to follow as necessary.  Gloriajean Dell. Leonie Green, BSN

## 2018-09-22 ENCOUNTER — Ambulatory Visit: Payer: Medicare Other

## 2018-09-23 ENCOUNTER — Ambulatory Visit
Admission: RE | Admit: 2018-09-23 | Discharge: 2018-09-23 | Disposition: A | Discharge status: Home / Self Care | Payer: Medicare Other | Source: Ambulatory Visit | Attending: Radiation Oncology | Admitting: Radiation Oncology

## 2018-09-23 ENCOUNTER — Ambulatory Visit: Payer: Medicare Other

## 2018-09-23 DIAGNOSIS — C3431 Malignant neoplasm of lower lobe, right bronchus or lung: Secondary | ICD-10-CM | POA: Diagnosis not present

## 2018-09-24 ENCOUNTER — Inpatient Hospital Stay: Payer: Medicare Other | Admitting: Hematology & Oncology

## 2018-09-24 ENCOUNTER — Ambulatory Visit: Payer: Medicare Other

## 2018-09-24 ENCOUNTER — Ambulatory Visit
Admission: RE | Admit: 2018-09-24 | Discharge: 2018-09-24 | Disposition: A | Discharge status: Home / Self Care | Payer: Medicare Other | Source: Ambulatory Visit | Attending: Radiation Oncology | Admitting: Radiation Oncology

## 2018-09-24 ENCOUNTER — Inpatient Hospital Stay: Payer: Medicare Other

## 2018-09-24 DIAGNOSIS — C3431 Malignant neoplasm of lower lobe, right bronchus or lung: Secondary | ICD-10-CM | POA: Diagnosis not present

## 2018-09-24 MED ORDER — SONAFINE EX EMUL
1.0000 "application " | Freq: Once | CUTANEOUS | Status: AC
  Administered 2018-09-24: 1 via TOPICAL

## 2018-09-25 ENCOUNTER — Ambulatory Visit: Payer: Medicare Other

## 2018-09-25 ENCOUNTER — Ambulatory Visit
Admission: RE | Admit: 2018-09-25 | Discharge: 2018-09-25 | Disposition: A | Discharge status: Home / Self Care | Payer: Medicare Other | Source: Ambulatory Visit | Attending: Radiation Oncology | Admitting: Radiation Oncology

## 2018-09-25 DIAGNOSIS — C3431 Malignant neoplasm of lower lobe, right bronchus or lung: Secondary | ICD-10-CM | POA: Diagnosis not present

## 2018-09-26 ENCOUNTER — Ambulatory Visit
Admission: RE | Admit: 2018-09-26 | Discharge: 2018-09-26 | Disposition: A | Discharge status: Home / Self Care | Payer: Medicare Other | Source: Ambulatory Visit | Attending: Radiation Oncology | Admitting: Radiation Oncology

## 2018-09-26 ENCOUNTER — Ambulatory Visit: Payer: Medicare Other

## 2018-09-26 DIAGNOSIS — C3431 Malignant neoplasm of lower lobe, right bronchus or lung: Secondary | ICD-10-CM | POA: Diagnosis not present

## 2018-09-29 ENCOUNTER — Ambulatory Visit: Payer: Medicare Other

## 2018-09-29 ENCOUNTER — Other Ambulatory Visit: Payer: Self-pay | Admitting: Hematology & Oncology

## 2018-09-29 ENCOUNTER — Ambulatory Visit
Admission: RE | Admit: 2018-09-29 | Discharge: 2018-09-29 | Disposition: A | Discharge status: Home / Self Care | Payer: Medicare Other | Source: Ambulatory Visit | Attending: Radiation Oncology | Admitting: Radiation Oncology

## 2018-09-29 DIAGNOSIS — C3431 Malignant neoplasm of lower lobe, right bronchus or lung: Secondary | ICD-10-CM | POA: Diagnosis not present

## 2018-09-29 DIAGNOSIS — R918 Other nonspecific abnormal finding of lung field: Secondary | ICD-10-CM

## 2018-09-30 ENCOUNTER — Other Ambulatory Visit: Payer: Self-pay | Admitting: *Deleted

## 2018-09-30 ENCOUNTER — Ambulatory Visit: Payer: Medicare Other

## 2018-09-30 ENCOUNTER — Ambulatory Visit
Admission: RE | Admit: 2018-09-30 | Discharge: 2018-09-30 | Disposition: A | Discharge status: Home / Self Care | Payer: Medicare Other | Source: Ambulatory Visit | Attending: Radiation Oncology | Admitting: Radiation Oncology

## 2018-09-30 DIAGNOSIS — Z510 Encounter for antineoplastic radiation therapy: Secondary | ICD-10-CM | POA: Insufficient documentation

## 2018-09-30 DIAGNOSIS — C3431 Malignant neoplasm of lower lobe, right bronchus or lung: Secondary | ICD-10-CM | POA: Diagnosis present

## 2018-10-01 ENCOUNTER — Ambulatory Visit
Admission: RE | Admit: 2018-10-01 | Discharge: 2018-10-01 | Disposition: A | Discharge status: Home / Self Care | Payer: Medicare Other | Source: Ambulatory Visit | Attending: Radiation Oncology | Admitting: Radiation Oncology

## 2018-10-01 ENCOUNTER — Other Ambulatory Visit: Payer: Self-pay | Admitting: *Deleted

## 2018-10-01 ENCOUNTER — Ambulatory Visit: Payer: Medicare Other

## 2018-10-01 DIAGNOSIS — C3431 Malignant neoplasm of lower lobe, right bronchus or lung: Secondary | ICD-10-CM | POA: Diagnosis not present

## 2018-10-01 MED ORDER — OXYCODONE HCL 10 MG PO TABS: 10.0000 mg | ORAL_TABLET | Freq: Four times a day (QID) | ORAL | 0 refills | Status: DC | PRN

## 2018-10-02 ENCOUNTER — Ambulatory Visit
Admission: RE | Admit: 2018-10-02 | Discharge: 2018-10-02 | Disposition: A | Discharge status: Home / Self Care | Payer: Medicare Other | Source: Ambulatory Visit | Attending: Radiation Oncology | Admitting: Radiation Oncology

## 2018-10-02 ENCOUNTER — Ambulatory Visit: Payer: Medicare Other

## 2018-10-02 DIAGNOSIS — C3431 Malignant neoplasm of lower lobe, right bronchus or lung: Secondary | ICD-10-CM | POA: Diagnosis not present

## 2018-10-03 ENCOUNTER — Other Ambulatory Visit: Payer: Self-pay | Admitting: Radiation Oncology

## 2018-10-03 ENCOUNTER — Ambulatory Visit: Payer: Medicare Other

## 2018-10-03 ENCOUNTER — Ambulatory Visit
Admission: RE | Admit: 2018-10-03 | Discharge: 2018-10-03 | Disposition: A | Discharge status: Home / Self Care | Payer: Medicare Other | Source: Ambulatory Visit | Attending: Radiation Oncology | Admitting: Radiation Oncology

## 2018-10-03 DIAGNOSIS — C3431 Malignant neoplasm of lower lobe, right bronchus or lung: Secondary | ICD-10-CM | POA: Diagnosis not present

## 2018-10-03 MED ORDER — GUAIFENESIN ER 600 MG PO TB12: 600.0000 mg | ORAL_TABLET | Freq: Two times a day (BID) | ORAL | 0 refills | Status: DC

## 2018-10-06 ENCOUNTER — Ambulatory Visit: Payer: Medicare Other

## 2018-10-07 ENCOUNTER — Ambulatory Visit: Payer: Medicare Other

## 2018-10-08 ENCOUNTER — Ambulatory Visit: Payer: Medicare Other

## 2018-10-09 ENCOUNTER — Ambulatory Visit: Payer: Medicare Other

## 2018-10-09 ENCOUNTER — Ambulatory Visit: Payer: Medicare Other | Admitting: Hematology & Oncology

## 2018-10-09 ENCOUNTER — Other Ambulatory Visit: Payer: Medicare Other

## 2018-10-10 ENCOUNTER — Ambulatory Visit: Payer: Medicare Other

## 2018-10-13 ENCOUNTER — Ambulatory Visit: Payer: Medicare Other

## 2018-10-13 ENCOUNTER — Telehealth: Payer: Self-pay | Admitting: *Deleted

## 2018-10-13 ENCOUNTER — Ambulatory Visit: Payer: Self-pay | Admitting: Radiation Oncology

## 2018-10-13 NOTE — Telephone Encounter (Signed)
I talked to Ryan Houston regarding missing his radiation treatments last week and so far this week.  He said he is not feeling well.  States his appetite is poor, feeling weak, using his oxygen and nebulizers more often than he was.  States he is coughing more and having some SOB.  I informed him to go to the emergency room.  He said he will see how today goes and he may end up going to the ER.  Will continue to follow as necessary.  Gloriajean Dell. Leonie Green, BSN

## 2018-10-14 ENCOUNTER — Ambulatory Visit
Admission: RE | Admit: 2018-10-14 | Discharge: 2018-10-14 | Disposition: A | Discharge status: Home / Self Care | Payer: Medicare Other | Source: Ambulatory Visit | Attending: Radiation Oncology | Admitting: Radiation Oncology

## 2018-10-14 ENCOUNTER — Ambulatory Visit: Payer: Medicare Other

## 2018-10-14 ENCOUNTER — Other Ambulatory Visit: Payer: Self-pay | Admitting: Radiation Oncology

## 2018-10-14 DIAGNOSIS — C3431 Malignant neoplasm of lower lobe, right bronchus or lung: Secondary | ICD-10-CM | POA: Diagnosis not present

## 2018-10-14 MED ORDER — BENZONATATE 100 MG PO CAPS: 200.0000 mg | ORAL_CAPSULE | Freq: Three times a day (TID) | ORAL | 0 refills | Status: DC | PRN

## 2018-10-15 ENCOUNTER — Other Ambulatory Visit: Payer: Self-pay | Admitting: *Deleted

## 2018-10-15 ENCOUNTER — Ambulatory Visit
Admission: RE | Admit: 2018-10-15 | Discharge: 2018-10-15 | Disposition: A | Discharge status: Home / Self Care | Payer: Medicare Other | Source: Ambulatory Visit | Attending: Radiation Oncology | Admitting: Radiation Oncology

## 2018-10-15 ENCOUNTER — Ambulatory Visit: Payer: Medicare Other

## 2018-10-15 DIAGNOSIS — R042 Hemoptysis: Secondary | ICD-10-CM

## 2018-10-15 DIAGNOSIS — C3491 Malignant neoplasm of unspecified part of right bronchus or lung: Secondary | ICD-10-CM

## 2018-10-15 DIAGNOSIS — J9859 Other diseases of mediastinum, not elsewhere classified: Secondary | ICD-10-CM

## 2018-10-15 DIAGNOSIS — C3431 Malignant neoplasm of lower lobe, right bronchus or lung: Secondary | ICD-10-CM | POA: Diagnosis not present

## 2018-10-15 MED ORDER — OXYCODONE ER 18 MG PO C12A: 18.0000 mg | EXTENDED_RELEASE_CAPSULE | Freq: Two times a day (BID) | ORAL | 0 refills | Status: DC

## 2018-10-15 MED ORDER — TRAMADOL HCL 50 MG PO TABS: 100.0000 mg | ORAL_TABLET | Freq: Four times a day (QID) | ORAL | 0 refills | Status: DC | PRN

## 2018-10-16 ENCOUNTER — Ambulatory Visit: Payer: Medicare Other

## 2018-10-16 ENCOUNTER — Other Ambulatory Visit: Payer: Self-pay | Admitting: *Deleted

## 2018-10-16 ENCOUNTER — Ambulatory Visit
Admission: RE | Admit: 2018-10-16 | Discharge: 2018-10-16 | Disposition: A | Discharge status: Home / Self Care | Payer: Medicare Other | Source: Ambulatory Visit | Attending: Radiation Oncology | Admitting: Radiation Oncology

## 2018-10-16 DIAGNOSIS — C3491 Malignant neoplasm of unspecified part of right bronchus or lung: Secondary | ICD-10-CM

## 2018-10-16 DIAGNOSIS — J9859 Other diseases of mediastinum, not elsewhere classified: Secondary | ICD-10-CM

## 2018-10-16 DIAGNOSIS — C3431 Malignant neoplasm of lower lobe, right bronchus or lung: Secondary | ICD-10-CM | POA: Diagnosis not present

## 2018-10-16 DIAGNOSIS — R042 Hemoptysis: Secondary | ICD-10-CM

## 2018-10-16 MED ORDER — OXYCODONE ER 18 MG PO C12A: 18.0000 mg | EXTENDED_RELEASE_CAPSULE | Freq: Two times a day (BID) | ORAL | 0 refills | Status: DC

## 2018-10-17 ENCOUNTER — Ambulatory Visit
Admission: RE | Admit: 2018-10-17 | Discharge: 2018-10-17 | Disposition: A | Discharge status: Home / Self Care | Payer: Medicare Other | Source: Ambulatory Visit | Attending: Radiation Oncology | Admitting: Radiation Oncology

## 2018-10-17 ENCOUNTER — Ambulatory Visit: Payer: Medicare Other

## 2018-10-17 DIAGNOSIS — C3431 Malignant neoplasm of lower lobe, right bronchus or lung: Secondary | ICD-10-CM | POA: Diagnosis not present

## 2018-10-17 MED ORDER — RADIAPLEXRX EX GEL
Freq: Once | CUTANEOUS | Status: AC
  Administered 2018-10-17: 17:00:00 via TOPICAL

## 2018-10-18 ENCOUNTER — Encounter: Payer: Self-pay | Admitting: Hematology & Oncology

## 2018-10-20 ENCOUNTER — Ambulatory Visit
Admission: RE | Admit: 2018-10-20 | Discharge: 2018-10-20 | Disposition: A | Discharge status: Home / Self Care | Payer: Medicare Other | Source: Ambulatory Visit | Attending: Radiation Oncology | Admitting: Radiation Oncology

## 2018-10-20 ENCOUNTER — Encounter: Payer: Self-pay | Admitting: Nutrition

## 2018-10-20 ENCOUNTER — Encounter: Payer: Self-pay | Admitting: Radiation Oncology

## 2018-10-20 DIAGNOSIS — C3431 Malignant neoplasm of lower lobe, right bronchus or lung: Secondary | ICD-10-CM | POA: Diagnosis not present

## 2018-10-20 NOTE — Progress Notes (Signed)
Provided one complimentary case of ensure enlive. 

## 2018-10-23 NOTE — Progress Notes (Signed)
  Radiation Oncology         [phone]) [phone] ________________________________  Name: Ryan Houston MRN: [id number]  Date: 10/20/2018  DOB: [birth year]  End of Treatment Note  Diagnosis:  71 y.o. male with Stage IIIB bronchogenic carcinoma of the right lung, probable adenocarcinoma, with hemoptysis  Indication for treatment::  curative       Radiation treatment dates:   08/11/2018 - 10/20/2018  Site/dose:   The right lung initially received 4 Gy in 2 fractions to treat the patient's symptoms on an urgent basis. The patient was then treated to the disease within the right lung to a dose of 60 Gy using a 4 field, 3-D conformal technique. The patient then received a cone down boost treatment for an additional 6 Gy. This yielded a final total dose of 66 Gy.   Narrative: The patient tolerated radiation treatment relatively well.   The patient did experience esophagitis during the course of treatment which required management. He reported an ongoing cough and chest pain and is taking oxycodone; a prescription for Tessalon Perles was also given. He denied any difficulty or pain with swallowing and maintained a good appetite and stable weight. He did have some hyperpigmentation to his right chest and right back by the end of treatment and is using Sonafine BID. He also noted increased fatigue.  Plan: The patient has completed radiation treatment. The patient will return to radiation oncology clinic for routine followup in one month. I advised the patient to call or return sooner if they have any questions or concerns related to their recovery or treatment. ________________________________  Jodelle Gross, MD, PhD  This document serves as a record of services personally performed by Kyung Rudd, MD. It was created on his behalf by Rae Lips, a trained medical scribe. The creation of this record is based on the scribe's personal observations and the provider's statements to them. This document has  been checked and approved by the attending provider.

## 2018-10-29 ENCOUNTER — Other Ambulatory Visit: Payer: Self-pay

## 2018-10-29 ENCOUNTER — Inpatient Hospital Stay: Payer: Medicare Other

## 2018-10-29 ENCOUNTER — Inpatient Hospital Stay: Payer: Medicare Other | Attending: Hematology & Oncology | Admitting: Hematology & Oncology

## 2018-10-29 ENCOUNTER — Encounter: Payer: Self-pay | Admitting: Hematology & Oncology

## 2018-10-29 ENCOUNTER — Telehealth: Payer: Self-pay | Admitting: *Deleted

## 2018-10-29 VITALS — BP 102/66 | HR 114 | Temp 98.5°F | Resp 16 | Wt 131.3 lb

## 2018-10-29 DIAGNOSIS — Z79899 Other long term (current) drug therapy: Secondary | ICD-10-CM | POA: Diagnosis not present

## 2018-10-29 DIAGNOSIS — C3431 Malignant neoplasm of lower lobe, right bronchus or lung: Secondary | ICD-10-CM

## 2018-10-29 DIAGNOSIS — Z923 Personal history of irradiation: Secondary | ICD-10-CM | POA: Insufficient documentation

## 2018-10-29 DIAGNOSIS — R918 Other nonspecific abnormal finding of lung field: Secondary | ICD-10-CM | POA: Insufficient documentation

## 2018-10-29 DIAGNOSIS — C3491 Malignant neoplasm of unspecified part of right bronchus or lung: Secondary | ICD-10-CM

## 2018-10-29 DIAGNOSIS — K5900 Constipation, unspecified: Secondary | ICD-10-CM | POA: Diagnosis not present

## 2018-10-29 DIAGNOSIS — J9859 Other diseases of mediastinum, not elsewhere classified: Secondary | ICD-10-CM

## 2018-10-29 DIAGNOSIS — R042 Hemoptysis: Secondary | ICD-10-CM

## 2018-10-29 DIAGNOSIS — B1920 Unspecified viral hepatitis C without hepatic coma: Secondary | ICD-10-CM | POA: Diagnosis not present

## 2018-10-29 LAB — CMP (CANCER CENTER ONLY)
ALBUMIN: 2.9 g/dL — AB (ref 3.5–5.0)
ALK PHOS: 96 U/L — AB (ref 26–84)
ALT: 183 U/L — ABNORMAL HIGH (ref 10–47)
AST: 204 U/L (ref 11–38)
Anion gap: 2 — ABNORMAL LOW (ref 5–15)
BUN: 13 mg/dL (ref 7–22)
CHLORIDE: 106 mmol/L (ref 98–108)
CO2: 27 mmol/L (ref 18–33)
Calcium: 10 mg/dL (ref 8.0–10.3)
Creatinine: 1.2 mg/dL (ref 0.60–1.20)
Glucose, Bld: 160 mg/dL — ABNORMAL HIGH (ref 73–118)
POTASSIUM: 3.5 mmol/L (ref 3.3–4.7)
SODIUM: 135 mmol/L (ref 128–145)
Total Bilirubin: 0.5 mg/dL (ref 0.2–1.6)
Total Protein: 9.6 g/dL — ABNORMAL HIGH (ref 6.4–8.1)

## 2018-10-29 LAB — CBC WITH DIFFERENTIAL (CANCER CENTER ONLY)
Abs Immature Granulocytes: 0.04 10*3/uL (ref 0.00–0.07)
BASOS PCT: 0 %
Basophils Absolute: 0 10*3/uL (ref 0.0–0.1)
EOS ABS: 0.1 10*3/uL (ref 0.0–0.5)
EOS PCT: 1 %
HCT: 42.5 % (ref 39.0–52.0)
HEMOGLOBIN: 13.7 g/dL (ref 13.0–17.0)
Immature Granulocytes: 1 %
Lymphocytes Relative: 10 %
Lymphs Abs: 0.7 10*3/uL (ref 0.7–4.0)
MCH: 30 pg (ref 26.0–34.0)
MCHC: 32.2 g/dL (ref 30.0–36.0)
MCV: 93.2 fL (ref 80.0–100.0)
Monocytes Absolute: 0.9 10*3/uL (ref 0.1–1.0)
Monocytes Relative: 11 %
NRBC: 0 % (ref 0.0–0.2)
Neutro Abs: 6 10*3/uL (ref 1.7–7.7)
Neutrophils Relative %: 77 %
PLATELETS: 196 10*3/uL (ref 150–400)
RBC: 4.56 MIL/uL (ref 4.22–5.81)
RDW: 13.4 % (ref 11.5–15.5)
WBC: 7.8 10*3/uL (ref 4.0–10.5)

## 2018-10-29 MED ORDER — GABAPENTIN 300 MG PO CAPS: 300.0000 mg | ORAL_CAPSULE | Freq: Three times a day (TID) | ORAL | 6 refills | Status: DC

## 2018-10-29 MED ORDER — NICOTINE 21 MG/24HR TD PT24: 21.0000 mg | MEDICATED_PATCH | TRANSDERMAL | 2 refills | Status: DC

## 2018-10-29 MED ORDER — HYDROXYZINE HCL 25 MG PO TABS: 25.0000 mg | ORAL_TABLET | Freq: Four times a day (QID) | ORAL | 6 refills | Status: DC

## 2018-10-29 MED ORDER — SENNOSIDES-DOCUSATE SODIUM 8.6-50 MG PO TABS: 1.0000 | ORAL_TABLET | Freq: Two times a day (BID) | ORAL | 3 refills | Status: DC

## 2018-10-29 MED ORDER — HEPARIN SOD (PORK) LOCK FLUSH 100 UNIT/ML IV SOLN
500.0000 [IU] | Freq: Once | INTRAVENOUS | Status: AC
  Administered 2018-10-29: 500 [IU] via INTRAVENOUS
  Filled 2018-10-29: qty 5

## 2018-10-29 MED ORDER — OXYCODONE ER 18 MG PO C12A: 18.0000 mg | EXTENDED_RELEASE_CAPSULE | Freq: Two times a day (BID) | ORAL | 0 refills | Status: DC

## 2018-10-29 MED ORDER — VITAMIN B-12 1000 MCG PO TABS: 1000.0000 ug | ORAL_TABLET | ORAL | 6 refills | Status: DC

## 2018-10-29 MED ORDER — OMEPRAZOLE 20 MG PO CPDR: 20.0000 mg | DELAYED_RELEASE_CAPSULE | Freq: Every day | ORAL | 4 refills | Status: DC

## 2018-10-29 MED ORDER — ONDANSETRON HCL 4 MG PO TABS: 4.0000 mg | ORAL_TABLET | Freq: Four times a day (QID) | ORAL | 3 refills | Status: DC | PRN

## 2018-10-29 MED ORDER — BENZONATATE 100 MG PO CAPS: 200.0000 mg | ORAL_CAPSULE | Freq: Three times a day (TID) | ORAL | 3 refills | Status: DC | PRN

## 2018-10-29 MED ORDER — OXYCODONE HCL 10 MG PO TABS: 10.0000 mg | ORAL_TABLET | Freq: Four times a day (QID) | ORAL | 0 refills | Status: DC | PRN

## 2018-10-29 MED ORDER — SODIUM CHLORIDE 0.9% FLUSH
10.0000 mL | INTRAVENOUS | Status: DC | PRN
  Administered 2018-10-29: 10 mL via INTRAVENOUS
  Filled 2018-10-29: qty 10

## 2018-10-29 MED ORDER — MIRABEGRON ER 25 MG PO TB24: 25.0000 mg | ORAL_TABLET | Freq: Every day | ORAL | 4 refills | Status: DC

## 2018-10-29 NOTE — Patient Instructions (Signed)

## 2018-10-29 NOTE — Telephone Encounter (Signed)
Received Panic AST level from [place] in Lab.  Dr. Marin Olp notified.  No orders received.  PAtient in room with Dr. Marin Olp

## 2018-10-29 NOTE — Progress Notes (Signed)
DISCONTINUE ON PATHWAY REGIMEN - Non-Small Cell Lung     Administer weekly:     Paclitaxel      Carboplatin   **Always confirm dose/schedule in your pharmacy ordering system**  REASON: Continuation Of Treatment PRIOR TREATMENT: [id number]: Carboplatin AUC=2 + Paclitaxel 45 mg/m2 Weekly During Radiation TREATMENT RESPONSE: Partial Response (PR)  START ON PATHWAY REGIMEN - Non-Small Cell Lung     A cycle is every 14 days:     Durvalumab   **Always confirm dose/schedule in your pharmacy ordering system**  Patient Characteristics: Stage III - Unresectable, PS = 0, 1 AJCC T Category: T3 Current Disease Status: No Distant Mets or Local Recurrence AJCC N Category: N2 AJCC M Category: M0 AJCC 8 Stage Grouping: IIIB Performance Status: PS = 0, 1 Intent of Therapy: Curative Intent, Discussed with Patient

## 2018-10-29 NOTE — Progress Notes (Signed)
Hematology and Oncology Follow Up Visit  Ryan Houston [id number] February 01, 1946 72 y.o. 10/29/2018   Principle Diagnosis:   Stage IIIB Bronchogenic carcinoma of the right lung -- probable adenocarcinoma  Current Therapy:    XRT -- s/p 6600 rad -- completed on 10/20/2018       Carbo/Taxol -- q week -- s/p cycle #1     Interim History:  Ryan Houston is back for for follow-up.   I am not seen him for quite a while.  Surprised that he looks as good as he looks.  I am very impressed with the fact that he completed his radiation therapy.  He completed this a week or so ago.  Extremely happy that he is done well.  He is not using oxygen.  His weight seems to be doing a little bit better.  He is to have a follow-up PET scan.  I will see about getting this set up in a few weeks to see how things look.  He still has a little bit of a cough.  He is not coughing up blood.  He ran out of his pain medication.  Somehow, he says that the other doctors would not refill this for him.  They told him that this was my responsibility.  I have no problems with this.  I told him that he should have called me to let me know that he needed a refill.  Unfortunately he lost his primary care doctor.  His primary care doctor retired.  He has no one who will now help with his hepatitis C.  He really needs to be on treatment for the hepatitis C.  I am not sure how we can go about getting this arranged for him.  He has had no issues with bowels or bladder.  He does have some constipation.  There is been no headache.  He has had no mouth sores.  Given the new information from clinical trials, I will now get him on immunotherapy.  Given that he has locally advanced non-small cell lung cancer, I think it would be prudent to have him on maintenance immunotherapy with Durvalumab.  I would have to say that his overall performance status is probably ECOG 1-2.    Medications:  Current Outpatient Medications:  .   albuterol (PROVENTIL HFA;VENTOLIN HFA) 108 (90 BASE) MCG/ACT inhaler, Inhale 1-2 puffs into the lungs every 6 (six) hours as needed for wheezing., Disp: 1 Inhaler, Rfl: 0 .  Ascorbic Acid (VITAMIN C) 1000 MG tablet, Take 1,000 mg by mouth every other day., Disp: , Rfl:  .  azelastine (ASTELIN) 0.1 % nasal spray, Place into both nostrils 2 (two) times daily. Use in each nostril as directed, Disp: , Rfl:  .  benzonatate (TESSALON) 100 MG capsule, Take 2 capsules (200 mg total) by mouth 3 (three) times daily as needed for cough., Disp: 40 capsule, Rfl: 0 .  Cholecalciferol (VITAMIN D-3) 1000 UNITS CAPS, Take 1,000 Units by mouth daily. , Disp: , Rfl:  .  dexamethasone (DECADRON) 4 MG tablet, Take 2 tablets (8 mg total) by mouth daily. Start the day after chemotherapy for 2 days., Disp: 30 tablet, Rfl: 1 .  dronabinol (MARINOL) 5 MG capsule, Take 1 capsule (5 mg total) by mouth 2 (two) times daily before a meal., Disp: 60 capsule, Rfl: 0 .  feeding supplement, ENSURE ENLIVE, (ENSURE ENLIVE) LIQD, Take 237 mLs by mouth 3 (three) times daily between meals., Disp: 237 mL, Rfl: 12 .  gabapentin (NEURONTIN) 300 MG capsule, Take 300 mg by mouth 3 (three) times daily., Disp: , Rfl:  .  hydrOXYzine (ATARAX/VISTARIL) 25 MG tablet, Take 1 tablet (25 mg total) by mouth every 6 (six) hours., Disp: 30 tablet, Rfl: 0 .  ipratropium-albuterol (DUONEB) 0.5-2.5 (3) MG/3ML SOLN, Take 3 mLs by nebulization 3 (three) times daily., Disp: 360 mL, Rfl: 2 .  lidocaine-prilocaine (EMLA) cream, Apply to affected area once, Disp: 30 g, Rfl: 3 .  LORazepam (ATIVAN) 0.5 MG tablet, TAKE 1 TABLET(0.5 MG) BY MOUTH EVERY 6 HOURS AS NEEDED FOR NAUSEA OR VOMITING, Disp: 30 tablet, Rfl: 0 .  mirabegron ER (MYRBETRIQ) 25 MG TB24 tablet, Take 1 tablet (25 mg total) by mouth daily., Disp: 30 tablet, Rfl: 0 .  montelukast (SINGULAIR) 10 MG tablet, Take 1 tablet (10 mg total) by mouth at bedtime., Disp: 90 tablet, Rfl: 1 .  nicotine (NICODERM CQ  - DOSED IN MG/24 HOURS) 21 mg/24hr patch, Place 1 patch (21 mg total) onto the skin daily., Disp: 28 patch, Rfl: 0 .  Omega-3 Fatty Acids (FISH OIL PO), Take 1 capsule by mouth every other day., Disp: , Rfl:  .  omeprazole (PRILOSEC) 20 MG capsule, Take 1 capsule (20 mg total) by mouth daily., Disp: 30 capsule, Rfl: 0 .  ondansetron (ZOFRAN) 4 MG tablet, Take 1 tablet (4 mg total) by mouth every 6 (six) hours as needed for nausea., Disp: 20 tablet, Rfl: 0 .  ondansetron (ZOFRAN) 8 MG tablet, Take 1 tablet (8 mg total) by mouth 2 (two) times daily as needed for refractory nausea / vomiting. Start on day 3 after chemo., Disp: 30 tablet, Rfl: 1 .  oxyCODONE ER (XTAMPZA ER) 18 MG C12A, Take 18 mg by mouth every 12 (twelve) hours., Disp: 60 each, Rfl: 0 .  Oxycodone HCl 10 MG TABS, Take 1 tablet (10 mg total) by mouth every 6 (six) hours as needed., Disp: 90 tablet, Rfl: 0 .  polyethylene glycol (MIRALAX / GLYCOLAX) packet, Take 17 g by mouth 2 (two) times daily., Disp: 100 each, Rfl: 4 .  prochlorperazine (COMPAZINE) 10 MG tablet, Take 1 tablet (10 mg total) by mouth every 6 (six) hours as needed for nausea or vomiting., Disp: 385 tablet, Rfl: 1 .  senna-docusate (SENOKOT-S) 8.6-50 MG tablet, Take 1 tablet by mouth 2 (two) times daily., Disp: , Rfl:  .  sucralfate (CARAFATE) 1 g tablet, Take 1 tablet (1 g total) by mouth 4 (four) times daily -  with meals and at bedtime. Crush and mix in 1 oz water, Disp: 360 tablet, Rfl: 0 .  traMADol (ULTRAM) 50 MG tablet, Take 2 tablets (100 mg total) by mouth every 6 (six) hours as needed., Disp: 90 tablet, Rfl: 0 .  vitamin B-12 (CYANOCOBALAMIN) 1000 MCG tablet, Take 1,000 mcg by mouth every other day., Disp: , Rfl:  .  VITAMIN E PO, Take 2 capsules by mouth every other day., Disp: , Rfl:  No current facility-administered medications for this visit.   Facility-Administered Medications Ordered in Other Visits:  .  sodium chloride flush (NS) 0.9 % injection 10 mL, 10  mL, Intravenous, PRN, Volanda Napoleon, MD, 10 mL at 10/29/18 1534  Allergies: No Known Allergies  Past Medical History, Surgical history, Social history, and Family History were reviewed and updated.  Review of Systems: Review of Systems  Constitutional: Positive for appetite change and fatigue.  HENT:  Negative.   Eyes: Negative.   Respiratory: Positive for hemoptysis and shortness  of breath.   Cardiovascular: Negative.   Gastrointestinal: Positive for constipation.  Endocrine: Negative.   Genitourinary: Negative.    Musculoskeletal: Positive for myalgias and neck pain.  Skin: Negative.   Neurological: Negative.   Hematological: Negative.   Psychiatric/Behavioral: Negative.     Physical Exam:  oxygen saturation is 97%.   Wt Readings from Last 3 Encounters:  10/29/18 131 lb 5 oz (59.6 kg)  09/16/18 134 lb 3.2 oz (60.9 kg)  09/10/18 130 lb (59 kg)    Physical Exam  Constitutional: He is oriented to person, place, and time.  HENT:  Head: Normocephalic and atraumatic.  Mouth/Throat: Oropharynx is clear and moist.  Eyes: Pupils are equal, round, and reactive to light. EOM are normal.  Neck: Normal range of motion.  Cardiovascular: Normal rate, regular rhythm and normal heart sounds.  Pulmonary/Chest: Effort normal and breath sounds normal.  Abdominal: Soft. Bowel sounds are normal.  Musculoskeletal: Normal range of motion. He exhibits no edema, tenderness or deformity.  Lymphadenopathy:    He has no cervical adenopathy.  Neurological: He is alert and oriented to person, place, and time.  Skin: Skin is warm and dry. No rash noted. No erythema.  Psychiatric: He has a normal mood and affect. His behavior is normal. Judgment and thought content normal.  Vitals reviewed.    Lab Results  Component Value Date   WBC 7.8 10/29/2018   HGB 13.7 10/29/2018   HCT 42.5 10/29/2018   MCV 93.2 10/29/2018   PLT 196 10/29/2018     Chemistry      Component Value Date/Time   NA  135 10/29/2018 1503   K 3.5 10/29/2018 1503   CL 106 10/29/2018 1503   CO2 27 10/29/2018 1503   BUN 13 10/29/2018 1503   CREATININE 1.20 10/29/2018 1503   CREATININE 1.31 (H) 11/09/2015 1528      Component Value Date/Time   CALCIUM 10.0 10/29/2018 1503   ALKPHOS 96 (H) 10/29/2018 1503   AST 204 (HH) 10/29/2018 1503   ALT 183 (H) 10/29/2018 1503   BILITOT 0.5 10/29/2018 1503      Impression and Plan: Mr. Arth is a 72 year old African-American male.  He has locally advanced, inoperable, bronchogenic carcinoma.  He is not operable from my point of view.  We will have to see what the PET scan shows.  I have to believe that the PET scan will show a very nice response.  Hopefully, this is a response that might be substantial and possibly even "curative."  We will then get him started on immunotherapy with Durvalumab.  I will have this started in about 2 or 3 weeks.  I spent about 40 minutes with the and his daughter.  70% of the time was spent face-to-face with them.  I counseled them and help coordinate his care and his treatments today.     Volanda Napoleon, MD 10/30/20193:55 PM

## 2018-10-30 ENCOUNTER — Other Ambulatory Visit: Payer: Self-pay | Admitting: *Deleted

## 2018-10-30 ENCOUNTER — Telehealth: Payer: Self-pay | Admitting: Hematology & Oncology

## 2018-10-30 LAB — IRON AND TIBC
Iron: 94 ug/dL (ref 42–163)
Saturation Ratios: 29 % (ref 20–55)
TIBC: 323 ug/dL (ref 202–409)
UIBC: 229 ug/dL (ref 117–376)

## 2018-10-30 LAB — FERRITIN: Ferritin: 906 ng/mL — ABNORMAL HIGH (ref 24–336)

## 2018-10-30 MED ORDER — OMEPRAZOLE 20 MG PO CPDR: 20.0000 mg | DELAYED_RELEASE_CAPSULE | Freq: Every day | ORAL | 4 refills | Status: DC

## 2018-10-30 NOTE — Telephone Encounter (Signed)
Spoke with pt to inform him of next appt 12/5 at 12 pm per 10/30 los

## 2018-11-12 ENCOUNTER — Telehealth: Payer: Self-pay | Admitting: Hematology & Oncology

## 2018-11-12 NOTE — Telephone Encounter (Signed)
I spoke with patient regarding appt for PET scan that is scheduled for 11/22 @ 2:00 and he is to be NPO after 8:00.  I also sent IB message to [facility] regarding med refills

## 2018-11-18 ENCOUNTER — Other Ambulatory Visit: Payer: Self-pay | Admitting: *Deleted

## 2018-11-18 MED ORDER — OXYCODONE HCL 10 MG PO TABS: 10.0000 mg | ORAL_TABLET | Freq: Four times a day (QID) | ORAL | 0 refills | Status: DC | PRN

## 2018-11-21 ENCOUNTER — Encounter (HOSPITAL_COMMUNITY)
Admission: RE | Admit: 2018-11-21 | Discharge: 2018-11-21 | Disposition: A | Discharge status: Home / Self Care | Payer: Medicare Other | Source: Ambulatory Visit | Attending: Hematology & Oncology | Admitting: Hematology & Oncology

## 2018-11-21 DIAGNOSIS — C3431 Malignant neoplasm of lower lobe, right bronchus or lung: Secondary | ICD-10-CM | POA: Insufficient documentation

## 2018-11-21 DIAGNOSIS — C3491 Malignant neoplasm of unspecified part of right bronchus or lung: Secondary | ICD-10-CM | POA: Diagnosis present

## 2018-11-21 LAB — GLUCOSE, CAPILLARY: GLUCOSE-CAPILLARY: 100 mg/dL — AB (ref 70–99)

## 2018-11-21 MED ORDER — FLUDEOXYGLUCOSE F - 18 (FDG) INJECTION
7.1600 | Freq: Once | INTRAVENOUS | Status: AC | PRN
  Administered 2018-11-21: 7.16 via INTRAVENOUS

## 2018-11-24 ENCOUNTER — Encounter: Payer: Self-pay | Admitting: Nutrition

## 2018-11-24 NOTE — Progress Notes (Signed)
Patient has received his final case of Ensure Enlive.

## 2018-12-01 ENCOUNTER — Telehealth: Payer: Self-pay | Admitting: Radiation Oncology

## 2018-12-01 ENCOUNTER — Ambulatory Visit
Admission: RE | Admit: 2018-12-01 | Discharge: 2018-12-01 | Disposition: A | Discharge status: Home / Self Care | Payer: Medicare Other | Source: Ambulatory Visit | Attending: Radiation Oncology | Admitting: Radiation Oncology

## 2018-12-01 NOTE — Telephone Encounter (Signed)
The patient missed his 1 month appt with me. I called to check on him and he's doing pretty well. He continues to have a cough but denies any progressive SOB, purulent productive mucous, or fevers. He is to see Dr. Marin Olp later this week to review imaging and plans for continuing immunotherapy. We will see him back as needed. He was given precautions to call if he has questions or concerns regarding his prior treatment.    Carola Rhine, PAC

## 2018-12-04 ENCOUNTER — Inpatient Hospital Stay: Payer: Medicare Other | Attending: Hematology & Oncology | Admitting: Hematology & Oncology

## 2018-12-04 ENCOUNTER — Inpatient Hospital Stay: Payer: Medicare Other

## 2018-12-04 DIAGNOSIS — Z923 Personal history of irradiation: Secondary | ICD-10-CM | POA: Insufficient documentation

## 2018-12-04 DIAGNOSIS — Z79899 Other long term (current) drug therapy: Secondary | ICD-10-CM | POA: Insufficient documentation

## 2018-12-04 DIAGNOSIS — C3491 Malignant neoplasm of unspecified part of right bronchus or lung: Secondary | ICD-10-CM | POA: Insufficient documentation

## 2018-12-04 DIAGNOSIS — Z9221 Personal history of antineoplastic chemotherapy: Secondary | ICD-10-CM | POA: Insufficient documentation

## 2018-12-04 DIAGNOSIS — K5900 Constipation, unspecified: Secondary | ICD-10-CM | POA: Insufficient documentation

## 2018-12-15 ENCOUNTER — Telehealth: Payer: Self-pay | Admitting: Hematology & Oncology

## 2018-12-15 NOTE — Telephone Encounter (Signed)
Spoke with pt to confirm 12/20 appts per 12/13 sch msg.nd to bring all medications for MD to review. pt wiil call back if unable to arrange transportation.

## 2018-12-18 ENCOUNTER — Other Ambulatory Visit: Payer: Medicare Other

## 2018-12-18 ENCOUNTER — Ambulatory Visit: Payer: Medicare Other

## 2018-12-18 ENCOUNTER — Ambulatory Visit: Payer: Medicare Other | Admitting: Hematology & Oncology

## 2018-12-19 ENCOUNTER — Inpatient Hospital Stay: Payer: Medicare Other

## 2018-12-19 ENCOUNTER — Inpatient Hospital Stay (HOSPITAL_BASED_OUTPATIENT_CLINIC_OR_DEPARTMENT_OTHER): Payer: Medicare Other | Admitting: Hematology & Oncology

## 2018-12-19 ENCOUNTER — Encounter: Payer: Self-pay | Admitting: Hematology & Oncology

## 2018-12-19 ENCOUNTER — Other Ambulatory Visit: Payer: Self-pay

## 2018-12-19 VITALS — BP 98/69 | HR 118 | Temp 98.8°F | Resp 19 | Wt 127.0 lb

## 2018-12-19 DIAGNOSIS — C3431 Malignant neoplasm of lower lobe, right bronchus or lung: Secondary | ICD-10-CM

## 2018-12-19 DIAGNOSIS — C3491 Malignant neoplasm of unspecified part of right bronchus or lung: Secondary | ICD-10-CM | POA: Diagnosis not present

## 2018-12-19 DIAGNOSIS — Z923 Personal history of irradiation: Secondary | ICD-10-CM | POA: Diagnosis not present

## 2018-12-19 DIAGNOSIS — K5900 Constipation, unspecified: Secondary | ICD-10-CM

## 2018-12-19 DIAGNOSIS — Z9221 Personal history of antineoplastic chemotherapy: Secondary | ICD-10-CM

## 2018-12-19 DIAGNOSIS — Z79899 Other long term (current) drug therapy: Secondary | ICD-10-CM

## 2018-12-19 DIAGNOSIS — J9859 Other diseases of mediastinum, not elsewhere classified: Secondary | ICD-10-CM

## 2018-12-19 DIAGNOSIS — R042 Hemoptysis: Secondary | ICD-10-CM

## 2018-12-19 LAB — CBC WITH DIFFERENTIAL (CANCER CENTER ONLY)
Abs Immature Granulocytes: 0.04 10*3/uL (ref 0.00–0.07)
Basophils Absolute: 0.1 10*3/uL (ref 0.0–0.1)
Basophils Relative: 1 %
EOS PCT: 2 %
Eosinophils Absolute: 0.1 10*3/uL (ref 0.0–0.5)
HEMATOCRIT: 46 % (ref 39.0–52.0)
HEMOGLOBIN: 15.3 g/dL (ref 13.0–17.0)
Immature Granulocytes: 1 %
LYMPHS ABS: 1 10*3/uL (ref 0.7–4.0)
LYMPHS PCT: 14 %
MCH: 31.5 pg (ref 26.0–34.0)
MCHC: 33.3 g/dL (ref 30.0–36.0)
MCV: 94.7 fL (ref 80.0–100.0)
MONO ABS: 0.8 10*3/uL (ref 0.1–1.0)
Monocytes Relative: 11 %
Neutro Abs: 5.4 10*3/uL (ref 1.7–7.7)
Neutrophils Relative %: 71 %
Platelet Count: 216 10*3/uL (ref 150–400)
RBC: 4.86 MIL/uL (ref 4.22–5.81)
RDW: 12.6 % (ref 11.5–15.5)
WBC: 7.5 10*3/uL (ref 4.0–10.5)
nRBC: 0 % (ref 0.0–0.2)

## 2018-12-19 LAB — CMP (CANCER CENTER ONLY)
ALK PHOS: 88 U/L (ref 38–126)
ALT: 54 U/L — ABNORMAL HIGH (ref 0–44)
AST: 53 U/L — ABNORMAL HIGH (ref 15–41)
Albumin: 3.5 g/dL (ref 3.5–5.0)
Anion gap: 5 (ref 5–15)
BUN: 18 mg/dL (ref 8–23)
CALCIUM: 9.9 mg/dL (ref 8.9–10.3)
CO2: 29 mmol/L (ref 22–32)
CREATININE: 1.07 mg/dL (ref 0.61–1.24)
Chloride: 101 mmol/L (ref 98–111)
GFR, Estimated: >60 mL/min (ref >60)
GLUCOSE: 129 mg/dL — AB (ref 70–99)
Potassium: 4.2 mmol/L (ref 3.5–5.1)
SODIUM: 135 mmol/L (ref 135–145)
Total Bilirubin: 0.4 mg/dL (ref 0.3–1.2)
Total Protein: 8.9 g/dL — ABNORMAL HIGH (ref 6.5–8.1)

## 2018-12-19 LAB — LACTATE DEHYDROGENASE: LDH: 202 U/L — ABNORMAL HIGH (ref 98–192)

## 2018-12-19 MED ORDER — OXYCODONE HCL 10 MG PO TABS: 10.0000 mg | ORAL_TABLET | Freq: Four times a day (QID) | ORAL | 0 refills | Status: DC | PRN

## 2018-12-19 MED ORDER — OXYCODONE ER 18 MG PO C12A: 18.0000 mg | EXTENDED_RELEASE_CAPSULE | Freq: Two times a day (BID) | ORAL | 0 refills | Status: DC

## 2018-12-19 NOTE — Progress Notes (Signed)
Hematology and Oncology Follow Up Visit  Ryan Houston [id number] 02-21-1946 72 y.o. 12/19/2018   Principle Diagnosis:   Stage IIIB Bronchogenic carcinoma of the right lung -- probable adenocarcinoma  Past Therapy:    XRT -- s/p 6600 rad -- completed on 10/20/2018       Carbo/Taxol -- q week -- s/p cycle #1   Current Therapy:         Durvalumab -- q 2wk cycles --start on 01/08/2019    Interim History:  Ryan Houston is back for for follow-up.  It is been a while since I last saw him.  I think we last saw him back in late October.  He completed his radiation therapy back on 10/20/2018.  He received a total of 66 Gy.  I cannot do chemotherapy with the radiation therapy.  He just was not going to tolerate this.  He had a PET scan done recently.  This was done on 11/21/2018.  This showed increase in a right lower lobe infrahilar mass that now has an SUV of 8.8.  He has a subcarinal lymph node that now measures 11.9 SUV.  There is a right lower lobe opacity that is 11 mm thick but with a SUV of 6.7.  There is no evidence of disease elsewhere.  He says that he just does not have much of an appetite.  He cannot get Marinol.  His insurance will not let us use this.  He is trying to smoke some marijuana which I think is fine.  I think that we have to try to continue him on therapy.  Hopefully, immunotherapy will be able to help [place].  Unfortunately, we are little bit "behind" as we were not able to get good biopsies when he was hospitalized.  As such, we have not been able to get molecular profiling.  I would like to hope that with immunotherapy, that we will be able to have some response.  He still has not had any treatment for the hepatitis C.  I am not sure if this will ever happen.  I think this is going to hamper our treatment for his lung cancer.  He is doing okay with respect to pain.  His pain regimen seems to be working fairly well.  He has had no bleeding.  He has little bit  of a cough.  This might be from the radiation.  He is not coughing up any blood.  He is a little constipated.  I told him to take some over-the-counter stool softeners.  He has had no leg swelling.  Overall, his performance status is ECOG 2.   Medications:  Current Outpatient Medications:  .  benzonatate (TESSALON) 100 MG capsule, Take 2 capsules (200 mg total) by mouth 3 (three) times daily as needed for cough., Disp: 100 capsule, Rfl: 3 .  Cholecalciferol (VITAMIN D-3) 1000 UNITS CAPS, Take 1,000 Units by mouth daily. , Disp: , Rfl:  .  gabapentin (NEURONTIN) 300 MG capsule, Take 1 capsule (300 mg total) by mouth 3 (three) times daily., Disp: 90 capsule, Rfl: 6 .  hydrOXYzine (ATARAX/VISTARIL) 25 MG tablet, Take 1 tablet (25 mg total) by mouth every 6 (six) hours., Disp: 60 tablet, Rfl: 6 .  mirabegron ER (MYRBETRIQ) 25 MG TB24 tablet, Take 1 tablet (25 mg total) by mouth daily., Disp: 30 tablet, Rfl: 4 .  montelukast (SINGULAIR) 10 MG tablet, Take 10 mg by mouth., Disp: , Rfl:  .  nicotine (NICODERM CQ - DOSED IN  MG/24 HOURS) 21 mg/24hr patch, Place 1 patch (21 mg total) onto the skin daily., Disp: 28 patch, Rfl: 2 .  omeprazole (PRILOSEC) 20 MG capsule, Take 1 capsule (20 mg total) by mouth daily., Disp: 30 capsule, Rfl: 4 .  ondansetron (ZOFRAN) 4 MG tablet, Take 1 tablet (4 mg total) by mouth every 6 (six) hours as needed for nausea., Disp: 30 tablet, Rfl: 3 .  oxyCODONE ER (XTAMPZA ER) 18 MG C12A, Take 18 mg by mouth every 12 (twelve) hours., Disp: 60 each, Rfl: 0 .  Oxycodone HCl 10 MG TABS, Take 1 tablet (10 mg total) by mouth every 6 (six) hours as needed., Disp: 90 tablet, Rfl: 0 .  polyethylene glycol (MIRALAX / GLYCOLAX) packet, Take 17 g by mouth 2 (two) times daily., Disp: 100 each, Rfl: 4 .  senna-docusate (SENOKOT-S) 8.6-50 MG tablet, Take 1 tablet by mouth 2 (two) times daily., Disp: 120 tablet, Rfl: 3 .  vitamin B-12 (CYANOCOBALAMIN) 1000 MCG tablet, Take 1 tablet (1,000  mcg total) by mouth every other day., Disp: 30 tablet, Rfl: 6  Allergies: No Known Allergies  Past Medical History, Surgical history, Social history, and Family History were reviewed and updated.  Review of Systems: Review of Systems  Constitutional: Positive for appetite change and fatigue.  HENT:  Negative.   Eyes: Negative.   Respiratory: Positive for hemoptysis and shortness of breath.   Cardiovascular: Negative.   Gastrointestinal: Positive for constipation.  Endocrine: Negative.   Genitourinary: Negative.    Musculoskeletal: Positive for myalgias and neck pain.  Skin: Negative.   Neurological: Negative.   Hematological: Negative.   Psychiatric/Behavioral: Negative.     Physical Exam:  weight is 127 lb (57.6 kg). His oral temperature is 98.8 F (37.1 C). His blood pressure is 98/69 and his pulse is 118 (abnormal). His respiration is 19 and oxygen saturation is 98%.   Wt Readings from Last 3 Encounters:  12/19/18 127 lb (57.6 kg)  10/29/18 131 lb 5 oz (59.6 kg)  09/16/18 134 lb 3.2 oz (60.9 kg)    Physical Exam Vitals signs reviewed.  HENT:     Head: Normocephalic and atraumatic.  Eyes:     Pupils: Pupils are equal, round, and reactive to light.  Neck:     Musculoskeletal: Normal range of motion.  Cardiovascular:     Rate and Rhythm: Normal rate and regular rhythm.     Heart sounds: Normal heart sounds.  Pulmonary:     Effort: Pulmonary effort is normal.     Breath sounds: Normal breath sounds.  Abdominal:     General: Bowel sounds are normal.     Palpations: Abdomen is soft.  Musculoskeletal: Normal range of motion.        General: No tenderness or deformity.  Lymphadenopathy:     Cervical: No cervical adenopathy.  Skin:    General: Skin is warm and dry.     Findings: No erythema or rash.  Neurological:     Mental Status: He is alert and oriented to person, place, and time.  Psychiatric:        Behavior: Behavior normal.        Thought Content:  Thought content normal.        Judgment: Judgment normal.      Lab Results  Component Value Date   WBC 7.5 12/19/2018   HGB 15.3 12/19/2018   HCT 46.0 12/19/2018   MCV 94.7 12/19/2018   PLT 216 12/19/2018     Chemistry  Component Value Date/Time   NA 135 12/19/2018 1345   K 4.2 12/19/2018 1345   CL 101 12/19/2018 1345   CO2 29 12/19/2018 1345   BUN 18 12/19/2018 1345   CREATININE 1.07 12/19/2018 1345   CREATININE 1.31 (H) 11/09/2015 1528      Component Value Date/Time   CALCIUM 9.9 12/19/2018 1345   ALKPHOS 88 12/19/2018 1345   AST 53 (H) 12/19/2018 1345   ALT 54 (H) 12/19/2018 1345   BILITOT 0.4 12/19/2018 1345      Impression and Plan: Mr. Koral is a 72 year old African-American male.  He has locally advanced, inoperable, bronchogenic carcinoma.  He is not operable from my point of view.  Hopefully, again, we will be able to have some impact with immunotherapy.  I spent about 45 minutes with him.  A friend came in with him.  I really told him that he has to try to get the hepatitis C treated.  This will help [place] with respect to his lung cancer.  He does have the Port-A-Cath in place still which will help.  We will start his Durvalumab therapy in early January.  I will give him 4 cycles and then repeat his PET scan.  If we do not see a improvement on the PET scan, then we will have to incorporate chemotherapy.       Volanda Napoleon, MD 12/20/20192:28 PM

## 2018-12-19 NOTE — Patient Instructions (Signed)

## 2019-01-01 ENCOUNTER — Other Ambulatory Visit: Payer: Medicare Other

## 2019-01-01 ENCOUNTER — Ambulatory Visit: Payer: Medicare Other | Admitting: Hematology & Oncology

## 2019-01-01 ENCOUNTER — Ambulatory Visit: Payer: Medicare Other

## 2019-01-06 ENCOUNTER — Telehealth: Payer: Self-pay | Admitting: Hematology & Oncology

## 2019-01-06 NOTE — Telephone Encounter (Signed)
Medical records sent to  [facility] Reporting Retrieval Attn: Zannie [place]: [phone] F: [phone]  Path: [phone].[facility]: [id number].[place]

## 2019-01-08 ENCOUNTER — Inpatient Hospital Stay: Payer: Medicare Other

## 2019-01-08 ENCOUNTER — Encounter: Payer: Self-pay | Admitting: Hematology & Oncology

## 2019-01-08 ENCOUNTER — Inpatient Hospital Stay: Payer: Medicare Other | Attending: Hematology & Oncology | Admitting: Hematology & Oncology

## 2019-01-08 ENCOUNTER — Other Ambulatory Visit: Payer: Self-pay

## 2019-01-08 VITALS — BP 111/63 | HR 101 | Temp 97.5°F | Resp 17

## 2019-01-08 DIAGNOSIS — C3431 Malignant neoplasm of lower lobe, right bronchus or lung: Secondary | ICD-10-CM | POA: Insufficient documentation

## 2019-01-08 DIAGNOSIS — M791 Myalgia, unspecified site: Secondary | ICD-10-CM | POA: Diagnosis not present

## 2019-01-08 DIAGNOSIS — K5900 Constipation, unspecified: Secondary | ICD-10-CM | POA: Diagnosis not present

## 2019-01-08 DIAGNOSIS — Z79899 Other long term (current) drug therapy: Secondary | ICD-10-CM | POA: Insufficient documentation

## 2019-01-08 DIAGNOSIS — Z5112 Encounter for antineoplastic immunotherapy: Secondary | ICD-10-CM | POA: Diagnosis not present

## 2019-01-08 DIAGNOSIS — B182 Chronic viral hepatitis C: Secondary | ICD-10-CM

## 2019-01-08 DIAGNOSIS — R042 Hemoptysis: Secondary | ICD-10-CM

## 2019-01-08 LAB — CBC WITH DIFFERENTIAL (CANCER CENTER ONLY)
ABS IMMATURE GRANULOCYTES: 0.03 10*3/uL (ref 0.00–0.07)
BASOS PCT: 1 %
Basophils Absolute: 0 10*3/uL (ref 0.0–0.1)
Eosinophils Absolute: 0.1 10*3/uL (ref 0.0–0.5)
Eosinophils Relative: 2 %
HEMATOCRIT: 41.5 % (ref 39.0–52.0)
Hemoglobin: 13.9 g/dL (ref 13.0–17.0)
Immature Granulocytes: 1 %
LYMPHS ABS: 0.6 10*3/uL — AB (ref 0.7–4.0)
Lymphocytes Relative: 10 %
MCH: 31.9 pg (ref 26.0–34.0)
MCHC: 33.5 g/dL (ref 30.0–36.0)
MCV: 95.2 fL (ref 80.0–100.0)
MONO ABS: 0.8 10*3/uL (ref 0.1–1.0)
MONOS PCT: 12 %
NEUTROS ABS: 4.8 10*3/uL (ref 1.7–7.7)
Neutrophils Relative %: 74 %
PLATELETS: 220 10*3/uL (ref 150–400)
RBC: 4.36 MIL/uL (ref 4.22–5.81)
RDW: 13.2 % (ref 11.5–15.5)
WBC Count: 6.4 10*3/uL (ref 4.0–10.5)
nRBC: 0 % (ref 0.0–0.2)

## 2019-01-08 LAB — CMP (CANCER CENTER ONLY)
ALK PHOS: 57 U/L (ref 38–126)
ALT: 14 U/L (ref 0–44)
AST: 25 U/L (ref 15–41)
Albumin: 3.2 g/dL — ABNORMAL LOW (ref 3.5–5.0)
Anion gap: 4 — ABNORMAL LOW (ref 5–15)
BILIRUBIN TOTAL: 0.5 mg/dL (ref 0.3–1.2)
BUN: 15 mg/dL (ref 8–23)
CALCIUM: 9.3 mg/dL (ref 8.9–10.3)
CO2: 28 mmol/L (ref 22–32)
CREATININE: 1.05 mg/dL (ref 0.61–1.24)
Chloride: 102 mmol/L (ref 98–111)
Glucose, Bld: 140 mg/dL — ABNORMAL HIGH (ref 70–99)
Potassium: 3.5 mmol/L (ref 3.5–5.1)
Sodium: 134 mmol/L — ABNORMAL LOW (ref 135–145)
TOTAL PROTEIN: 8.7 g/dL — AB (ref 6.5–8.1)

## 2019-01-08 MED ORDER — LIDOCAINE-PRILOCAINE 2.5-2.5 % EX CREA: TOPICAL_CREAM | CUTANEOUS | 3 refills | Status: DC

## 2019-01-08 MED ORDER — SODIUM CHLORIDE 0.9% FLUSH
10.0000 mL | INTRAVENOUS | Status: DC | PRN
  Administered 2019-01-08: 10 mL
  Filled 2019-01-08: qty 10

## 2019-01-08 MED ORDER — HEPARIN SOD (PORK) LOCK FLUSH 100 UNIT/ML IV SOLN
500.0000 [IU] | Freq: Once | INTRAVENOUS | Status: AC | PRN
  Administered 2019-01-08: 500 [IU]
  Filled 2019-01-08: qty 5

## 2019-01-08 MED ORDER — SODIUM CHLORIDE 0.9 % IV SOLN
10.0000 mg/kg | Freq: Once | INTRAVENOUS | Status: DC
  Filled 2019-01-08: qty 12

## 2019-01-08 MED ORDER — SODIUM CHLORIDE 0.9 % IV SOLN
Freq: Once | INTRAVENOUS | Status: AC
  Administered 2019-01-08: 14:00:00 via INTRAVENOUS
  Filled 2019-01-08: qty 250

## 2019-01-08 MED ORDER — SODIUM CHLORIDE 0.9 % IV SOLN
10.4000 mg/kg | Freq: Once | INTRAVENOUS | Status: AC
  Administered 2019-01-08: 620 mg via INTRAVENOUS
  Filled 2019-01-08: qty 2.4

## 2019-01-08 NOTE — Patient Instructions (Signed)
Durvalumab injection What is this medicine? DURVALUMAB (dur VAL ue mab) is a monoclonal antibody. It is used to treat urothelial cancer and lung cancer. This medicine may be used for other purposes; ask your health care provider or pharmacist if you have questions. COMMON BRAND NAME(S): [id number] What should I tell my health care provider before I take this medicine? They need to know if you have any of these conditions: -diabetes -immune system problems -infection -inflammatory bowel disease -kidney disease -liver disease -lung or breathing disease -lupus -organ transplant -stomach or intestine problems -thyroid disease -an unusual or allergic reaction to durvalumab, other medicines, foods, dyes, or preservatives -pregnant or trying to get pregnant -breast-feeding How should I use this medicine? This medicine is for infusion into a vein. It is given by a health care professional in a hospital or clinic setting. A special MedGuide will be given to you before each treatment. Be sure to read this information carefully each time. Talk to your pediatrician regarding the use of this medicine in children. Special care may be needed. Overdosage: If you think you have taken too much of this medicine contact a poison control center or emergency room at once. NOTE: This medicine is only for you. Do not share this medicine with others. What if I miss a dose? It is important not to miss your dose. Call your doctor or health care professional if you are unable to keep an appointment. What may interact with this medicine? Interactions have not been studied. This list may not describe all possible interactions. Give your health care provider a list of all the medicines, herbs, non-prescription drugs, or dietary supplements you use. Also tell them if you smoke, drink alcohol, or use illegal drugs. Some items may interact with your medicine. What should I watch for while using this medicine? This drug  may make you feel generally unwell. Continue your course of treatment even though you feel ill unless your doctor tells you to stop. You may need blood work done while you are taking this medicine. Do not become pregnant while taking this medicine or for 3 months after stopping it. Women should inform their doctor if they wish to become pregnant or think they might be pregnant. There is a potential for serious side effects to an unborn child. Talk to your health care professional or pharmacist for more information. Do not breast-feed an infant while taking this medicine or for 3 months after stopping it. What side effects may I notice from receiving this medicine? Side effects that you should report to your doctor or health care professional as soon as possible: -allergic reactions like skin rash, itching or hives, swelling of the face, lips, or tongue -black, tarry stools -bloody or watery diarrhea -breathing problems -change in emotions or moods -change in sex drive -changes in vision -chest pain or chest tightness -chills -confusion -cough -facial flushing -fever -headache -signs and symptoms of high blood sugar such as dizziness; dry mouth; dry skin; fruity breath; nausea; stomach pain; increased hunger or thirst; increased urination -signs and symptoms of liver injury like dark yellow or brown urine; general ill feeling or flu-like symptoms; light-colored stools; loss of appetite; nausea; right upper belly pain; unusually weak or tired; yellowing of the eyes or skin -stomach pain -trouble passing urine or change in the amount of urine -weight gain or weight loss Side effects that usually do not require medical attention (report these to your doctor or health care professional if they continue or are   bothersome): -bone pain -constipation -loss of appetite -muscle pain -nausea -swelling of the ankles, feet, hands -tiredness This list may not describe all possible side effects. Call  your doctor for medical advice about side effects. You may report side effects to FDA at 1-800-FDA-1088. Where should I keep my medicine? This drug is given in a hospital or clinic and will not be stored at home. NOTE: This sheet is a summary. It may not cover all possible information. If you have questions about this medicine, talk to your doctor, pharmacist, or health care provider.  2019 Elsevier/Gold Standard (2017-02-26 19:25:04)

## 2019-01-08 NOTE — Progress Notes (Signed)
Hematology and Oncology Follow Up Visit  Ryan Houston [id number] [birth year] 73 y.o. 01/08/2019   Principle Diagnosis:   Stage IIIB Bronchogenic carcinoma of the right lung -- probable adenocarcinoma  Past Therapy:    XRT -- s/p 6600 rad -- completed on 10/20/2018       Carbo/Taxol -- q week -- s/p cycle #1   Current Therapy:         Durvalumab -- q 2wk cycles -- cycle #1 on 01/08/2019    Interim History:  Ryan Houston is back for for follow-up.  He actually looks a little bit better.  I think that he is eating a little better.  His pain seems to be doing better now that we have him back on pain medication.  He will start his Durvalumab today.  Hopefully, we will find that this will help him.  He has had some discomfort over on the right side of his chest.  Is hard to say and what exacerbates this.  It just seems to come on without any provocation.  He has had no change in bowel or bladder habits.  He has had no diarrhea.  He is not coughing as much.  There is been no leg swelling.  Overall, his performance status is ECOG 1-2.   Medications:  Current Outpatient Medications:  .  Cholecalciferol (VITAMIN D-3) 1000 UNITS CAPS, Take 1,000 Units by mouth daily. , Disp: , Rfl:  .  gabapentin (NEURONTIN) 300 MG capsule, Take 1 capsule (300 mg total) by mouth 3 (three) times daily., Disp: 90 capsule, Rfl: 6 .  lidocaine-prilocaine (EMLA) cream, Apply to affected area once, Disp: 30 g, Rfl: 3 .  mirabegron ER (MYRBETRIQ) 25 MG TB24 tablet, Take 1 tablet (25 mg total) by mouth daily., Disp: 30 tablet, Rfl: 4 .  montelukast (SINGULAIR) 10 MG tablet, Take 10 mg by mouth., Disp: , Rfl:  .  omeprazole (PRILOSEC) 20 MG capsule, Take 1 capsule (20 mg total) by mouth daily., Disp: 30 capsule, Rfl: 4 .  oxyCODONE ER (XTAMPZA ER) 18 MG C12A, Take 18 mg by mouth every 12 (twelve) hours., Disp: 60 each, Rfl: 0 .  Oxycodone HCl 10 MG TABS, Take 1 tablet (10 mg total) by mouth every 6 (six)  hours as needed., Disp: 90 tablet, Rfl: 0 .  senna-docusate (SENOKOT-S) 8.6-50 MG tablet, Take 1 tablet by mouth 2 (two) times daily., Disp: 120 tablet, Rfl: 3 .  vitamin B-12 (CYANOCOBALAMIN) 1000 MCG tablet, Take 1 tablet (1,000 mcg total) by mouth every other day., Disp: 30 tablet, Rfl: 6 No current facility-administered medications for this visit.   Facility-Administered Medications Ordered in Other Visits:  .  sodium chloride flush (NS) 0.9 % injection 10 mL, 10 mL, Intracatheter, PRN, Volanda Napoleon, MD, 10 mL at 01/08/19 1524  Allergies: No Known Allergies  Past Medical History, Surgical history, Social history, and Family History were reviewed and updated.  Review of Systems: Review of Systems  Constitutional: Positive for appetite change and fatigue.  HENT:  Negative.   Eyes: Negative.   Respiratory: Positive for hemoptysis and shortness of breath.   Cardiovascular: Negative.   Gastrointestinal: Positive for constipation.  Endocrine: Negative.   Genitourinary: Negative.    Musculoskeletal: Positive for myalgias and neck pain.  Skin: Negative.   Neurological: Negative.   Hematological: Negative.   Psychiatric/Behavioral: Negative.     Physical Exam:  oral temperature is 97.5 F (36.4 C) (abnormal). His blood pressure is 111/63 and his pulse is  101 (abnormal). His respiration is 17 and oxygen saturation is 95%.   Wt Readings from Last 3 Encounters:  01/08/19 130 lb 12 oz (59.3 kg)  12/19/18 127 lb (57.6 kg)  10/29/18 131 lb 5 oz (59.6 kg)    Physical Exam Vitals signs reviewed.  HENT:     Head: Normocephalic and atraumatic.  Eyes:     Pupils: Pupils are equal, round, and reactive to light.  Neck:     Musculoskeletal: Normal range of motion.  Cardiovascular:     Rate and Rhythm: Normal rate and regular rhythm.     Heart sounds: Normal heart sounds.  Pulmonary:     Effort: Pulmonary effort is normal.     Breath sounds: Normal breath sounds.  Abdominal:      General: Bowel sounds are normal.     Palpations: Abdomen is soft.  Musculoskeletal: Normal range of motion.        General: No tenderness or deformity.  Lymphadenopathy:     Cervical: No cervical adenopathy.  Skin:    General: Skin is warm and dry.     Findings: No erythema or rash.  Neurological:     Mental Status: He is alert and oriented to person, place, and time.  Psychiatric:        Behavior: Behavior normal.        Thought Content: Thought content normal.        Judgment: Judgment normal.      Lab Results  Component Value Date   WBC 6.4 01/08/2019   HGB 13.9 01/08/2019   HCT 41.5 01/08/2019   MCV 95.2 01/08/2019   PLT 220 01/08/2019     Chemistry      Component Value Date/Time   NA 134 (L) 01/08/2019 1244   K 3.5 01/08/2019 1244   CL 102 01/08/2019 1244   CO2 28 01/08/2019 1244   BUN 15 01/08/2019 1244   CREATININE 1.05 01/08/2019 1244   CREATININE 1.31 (H) 11/09/2015 1528      Component Value Date/Time   CALCIUM 9.3 01/08/2019 1244   ALKPHOS 57 01/08/2019 1244   AST 25 01/08/2019 1244   ALT 14 01/08/2019 1244   BILITOT 0.5 01/08/2019 1244      Impression and Plan: Ryan Houston is a 73 year old African-American male.  He has locally advanced, inoperable, bronchogenic carcinoma.    We will proceed with Durvalumab now.  I think this would be the only therapy that he would be able to tolerate.  We are sort of "hamstrung" because we just cannot get enough tissue for adequate molecular profiling.  We will proceed with the Durvalumab.  I will give him 4 cycles and then we will repeat a PET scan and see how everything looks.  Hopefully, he will continue to gain some weight.  I think this would certainly be an encouraging indicator that he is responding.  I have not yet found that he is having anything done for the hepatitis C infection.  I am not sure at all as to whether or not he will ever see a infectious disease specialist for this.         Volanda Napoleon, MD 1/9/20204:56 PM

## 2019-01-08 NOTE — Patient Instructions (Signed)

## 2019-01-15 ENCOUNTER — Other Ambulatory Visit: Payer: Self-pay | Admitting: Hematology & Oncology

## 2019-01-15 ENCOUNTER — Ambulatory Visit: Payer: Medicare Other | Admitting: Hematology & Oncology

## 2019-01-15 ENCOUNTER — Other Ambulatory Visit: Payer: Self-pay | Admitting: *Deleted

## 2019-01-15 ENCOUNTER — Other Ambulatory Visit: Payer: Medicare Other

## 2019-01-15 ENCOUNTER — Ambulatory Visit: Payer: Medicare Other

## 2019-01-15 DIAGNOSIS — R042 Hemoptysis: Secondary | ICD-10-CM

## 2019-01-15 DIAGNOSIS — J9859 Other diseases of mediastinum, not elsewhere classified: Secondary | ICD-10-CM

## 2019-01-15 DIAGNOSIS — C3491 Malignant neoplasm of unspecified part of right bronchus or lung: Secondary | ICD-10-CM

## 2019-01-15 MED ORDER — OXYCODONE ER 18 MG PO C12A: 18.0000 mg | EXTENDED_RELEASE_CAPSULE | Freq: Two times a day (BID) | ORAL | 0 refills | Status: DC

## 2019-01-15 MED ORDER — OXYCODONE HCL 10 MG PO TABS: 10.0000 mg | ORAL_TABLET | Freq: Four times a day (QID) | ORAL | 0 refills | Status: DC | PRN

## 2019-01-22 ENCOUNTER — Inpatient Hospital Stay: Payer: Medicare Other

## 2019-01-22 ENCOUNTER — Inpatient Hospital Stay: Payer: Medicare Other | Admitting: Hematology & Oncology

## 2019-02-05 ENCOUNTER — Inpatient Hospital Stay: Payer: Medicare Other

## 2019-02-05 ENCOUNTER — Inpatient Hospital Stay: Payer: Medicare Other | Attending: Hematology & Oncology | Admitting: Hematology & Oncology

## 2019-02-05 DIAGNOSIS — Z923 Personal history of irradiation: Secondary | ICD-10-CM | POA: Insufficient documentation

## 2019-02-05 DIAGNOSIS — C3431 Malignant neoplasm of lower lobe, right bronchus or lung: Secondary | ICD-10-CM | POA: Insufficient documentation

## 2019-02-05 DIAGNOSIS — Z79899 Other long term (current) drug therapy: Secondary | ICD-10-CM | POA: Insufficient documentation

## 2019-02-05 DIAGNOSIS — R630 Anorexia: Secondary | ICD-10-CM | POA: Insufficient documentation

## 2019-02-05 DIAGNOSIS — Z5112 Encounter for antineoplastic immunotherapy: Secondary | ICD-10-CM | POA: Insufficient documentation

## 2019-02-06 ENCOUNTER — Telehealth: Payer: Self-pay | Admitting: *Deleted

## 2019-02-06 ENCOUNTER — Other Ambulatory Visit: Payer: Self-pay | Admitting: *Deleted

## 2019-02-06 ENCOUNTER — Telehealth: Payer: Self-pay | Admitting: Hematology & Oncology

## 2019-02-06 DIAGNOSIS — R918 Other nonspecific abnormal finding of lung field: Secondary | ICD-10-CM

## 2019-02-06 DIAGNOSIS — J9859 Other diseases of mediastinum, not elsewhere classified: Secondary | ICD-10-CM

## 2019-02-06 DIAGNOSIS — R042 Hemoptysis: Secondary | ICD-10-CM

## 2019-02-06 DIAGNOSIS — C3491 Malignant neoplasm of unspecified part of right bronchus or lung: Secondary | ICD-10-CM

## 2019-02-06 MED ORDER — OXYCODONE ER 18 MG PO C12A: 18.0000 mg | EXTENDED_RELEASE_CAPSULE | Freq: Two times a day (BID) | ORAL | 0 refills | Status: DC

## 2019-02-06 MED ORDER — OXYCODONE HCL 10 MG PO TABS: 10.0000 mg | ORAL_TABLET | Freq: Four times a day (QID) | ORAL | 0 refills | Status: DC | PRN

## 2019-02-06 NOTE — Telephone Encounter (Signed)
I called patient's cell # and VM was full could not accept any messages.  I then called home number and it just rings busy.  I then called Delores and LMVM for her to advise patient of upcoming appointments that were r/s from 2/6.  I asked her to call me back to confirm these appointments per 2/7 staff message

## 2019-02-06 NOTE — Telephone Encounter (Signed)
Patient called and left a voice mail message stating,"I had several appointments yesterday, 02/05/2019, but transportation never showed up to pick me up. I need to reschedule all of my appointments. Return phone number is [phone]."

## 2019-02-09 ENCOUNTER — Ambulatory Visit: Payer: Medicare Other

## 2019-02-09 ENCOUNTER — Other Ambulatory Visit: Payer: Medicare Other

## 2019-02-09 ENCOUNTER — Ambulatory Visit: Payer: Medicare Other | Admitting: Hematology & Oncology

## 2019-02-12 ENCOUNTER — Encounter: Payer: Medicare Other | Admitting: Infectious Diseases

## 2019-02-13 ENCOUNTER — Other Ambulatory Visit: Payer: Medicare Other

## 2019-02-13 ENCOUNTER — Ambulatory Visit: Payer: Medicare Other | Admitting: Hematology & Oncology

## 2019-02-13 ENCOUNTER — Ambulatory Visit: Payer: Medicare Other

## 2019-02-17 ENCOUNTER — Telehealth: Payer: Self-pay | Admitting: Hematology & Oncology

## 2019-02-17 NOTE — Telephone Encounter (Signed)
Spoke with pt to confirm 2/24 appts r/s due to transportation issues. Pt will call transportation to schedule a ride for this date/time

## 2019-02-19 ENCOUNTER — Ambulatory Visit: Payer: Medicare Other

## 2019-02-19 ENCOUNTER — Other Ambulatory Visit: Payer: Medicare Other

## 2019-02-19 ENCOUNTER — Encounter: Payer: Self-pay | Admitting: Nutrition

## 2019-02-19 NOTE — Progress Notes (Signed)
Provided one case of Ensure Enlive.

## 2019-02-23 ENCOUNTER — Inpatient Hospital Stay: Payer: Medicare Other

## 2019-02-23 ENCOUNTER — Inpatient Hospital Stay: Payer: Medicare Other | Admitting: Family

## 2019-02-23 ENCOUNTER — Ambulatory Visit: Payer: Medicare Other

## 2019-02-25 ENCOUNTER — Inpatient Hospital Stay (HOSPITAL_BASED_OUTPATIENT_CLINIC_OR_DEPARTMENT_OTHER): Payer: Medicare Other | Admitting: Hematology & Oncology

## 2019-02-25 ENCOUNTER — Inpatient Hospital Stay: Payer: Medicare Other

## 2019-02-25 ENCOUNTER — Telehealth: Payer: Self-pay | Admitting: Hematology & Oncology

## 2019-02-25 ENCOUNTER — Other Ambulatory Visit: Payer: Self-pay

## 2019-02-25 VITALS — BP 100/62 | HR 113 | Temp 97.8°F | Resp 22 | Wt 127.8 lb

## 2019-02-25 DIAGNOSIS — J9859 Other diseases of mediastinum, not elsewhere classified: Secondary | ICD-10-CM

## 2019-02-25 DIAGNOSIS — B182 Chronic viral hepatitis C: Secondary | ICD-10-CM

## 2019-02-25 DIAGNOSIS — Z923 Personal history of irradiation: Secondary | ICD-10-CM | POA: Diagnosis not present

## 2019-02-25 DIAGNOSIS — C3431 Malignant neoplasm of lower lobe, right bronchus or lung: Secondary | ICD-10-CM

## 2019-02-25 DIAGNOSIS — R630 Anorexia: Secondary | ICD-10-CM | POA: Diagnosis not present

## 2019-02-25 DIAGNOSIS — Z5112 Encounter for antineoplastic immunotherapy: Secondary | ICD-10-CM

## 2019-02-25 DIAGNOSIS — R918 Other nonspecific abnormal finding of lung field: Secondary | ICD-10-CM

## 2019-02-25 DIAGNOSIS — Z79899 Other long term (current) drug therapy: Secondary | ICD-10-CM

## 2019-02-25 DIAGNOSIS — R042 Hemoptysis: Secondary | ICD-10-CM

## 2019-02-25 LAB — CMP (CANCER CENTER ONLY)
ALBUMIN: 3.5 g/dL (ref 3.5–5.0)
ALT: 56 U/L — ABNORMAL HIGH (ref 0–44)
ANION GAP: 7 (ref 5–15)
AST: 67 U/L — AB (ref 15–41)
Alkaline Phosphatase: 77 U/L (ref 38–126)
BUN: 21 mg/dL (ref 8–23)
CHLORIDE: 95 mmol/L — AB (ref 98–111)
CO2: 33 mmol/L — ABNORMAL HIGH (ref 22–32)
Calcium: 10.8 mg/dL — ABNORMAL HIGH (ref 8.9–10.3)
Creatinine: 1.34 mg/dL — ABNORMAL HIGH (ref 0.61–1.24)
GFR, EST NON AFRICAN AMERICAN: 52 mL/min — AB (ref >60)
GFR, Est AFR Am: >60 mL/min (ref >60)
GLUCOSE: 221 mg/dL — AB (ref 70–99)
POTASSIUM: 3.8 mmol/L (ref 3.5–5.1)
Sodium: 135 mmol/L (ref 135–145)
Total Bilirubin: 0.6 mg/dL (ref 0.3–1.2)
Total Protein: 9.8 g/dL — ABNORMAL HIGH (ref 6.5–8.1)

## 2019-02-25 LAB — CBC WITH DIFFERENTIAL (CANCER CENTER ONLY)
ABS IMMATURE GRANULOCYTES: 0.05 10*3/uL (ref 0.00–0.07)
Basophils Absolute: 0.1 10*3/uL (ref 0.0–0.1)
Basophils Relative: 1 %
EOS PCT: 1 %
Eosinophils Absolute: 0.1 10*3/uL (ref 0.0–0.5)
HCT: 43.7 % (ref 39.0–52.0)
HEMOGLOBIN: 14.6 g/dL (ref 13.0–17.0)
Immature Granulocytes: 1 %
LYMPHS PCT: 16 %
Lymphs Abs: 1.5 10*3/uL (ref 0.7–4.0)
MCH: 31.3 pg (ref 26.0–34.0)
MCHC: 33.4 g/dL (ref 30.0–36.0)
MCV: 93.6 fL (ref 80.0–100.0)
MONO ABS: 1 10*3/uL (ref 0.1–1.0)
MONOS PCT: 10 %
NEUTROS ABS: 7.1 10*3/uL (ref 1.7–7.7)
Neutrophils Relative %: 71 %
PLATELETS: 259 10*3/uL (ref 150–400)
RBC: 4.67 MIL/uL (ref 4.22–5.81)
RDW: 12.8 % (ref 11.5–15.5)
WBC Count: 9.8 10*3/uL (ref 4.0–10.5)
nRBC: 0 % (ref 0.0–0.2)

## 2019-02-25 LAB — TSH: TSH: 1.474 u[IU]/mL (ref 0.320–4.118)

## 2019-02-25 LAB — IRON AND TIBC
IRON: 78 ug/dL (ref 42–163)
SATURATION RATIOS: 23 % (ref 20–55)
TIBC: 335 ug/dL (ref 202–409)
UIBC: 257 ug/dL (ref 117–376)

## 2019-02-25 LAB — FERRITIN: Ferritin: 450 ng/mL — ABNORMAL HIGH (ref 24–336)

## 2019-02-25 MED ORDER — HEPARIN SOD (PORK) LOCK FLUSH 100 UNIT/ML IV SOLN
500.0000 [IU] | Freq: Once | INTRAVENOUS | Status: AC | PRN
  Administered 2019-02-25: 500 [IU]
  Filled 2019-02-25: qty 5

## 2019-02-25 MED ORDER — SODIUM CHLORIDE 0.9% FLUSH
10.0000 mL | INTRAVENOUS | Status: DC | PRN
  Administered 2019-02-25: 10 mL
  Filled 2019-02-25: qty 10

## 2019-02-25 MED ORDER — SODIUM CHLORIDE 0.9 % IV SOLN
620.0000 mg | Freq: Once | INTRAVENOUS | Status: AC
  Administered 2019-02-25: 620 mg via INTRAVENOUS
  Filled 2019-02-25: qty 2.4

## 2019-02-25 MED ORDER — SODIUM CHLORIDE 0.9 % IV SOLN
Freq: Once | INTRAVENOUS | Status: AC
  Administered 2019-02-25: 09:00:00 via INTRAVENOUS
  Filled 2019-02-25: qty 250

## 2019-02-25 MED ORDER — OXYCODONE HCL 10 MG PO TABS: 10.0000 mg | ORAL_TABLET | Freq: Four times a day (QID) | ORAL | 0 refills | Status: DC | PRN

## 2019-02-25 MED ORDER — IPRATROPIUM-ALBUTEROL 0.5-2.5 (3) MG/3ML IN SOLN
3.0000 mL | Freq: Four times a day (QID) | RESPIRATORY_TRACT | Status: DC
  Administered 2019-02-25: 3 mL via RESPIRATORY_TRACT

## 2019-02-25 MED ORDER — IPRATROPIUM-ALBUTEROL 0.5-2.5 (3) MG/3ML IN SOLN
RESPIRATORY_TRACT | Status: AC
  Filled 2019-02-25: qty 3

## 2019-02-25 NOTE — Telephone Encounter (Signed)
sched appt per 2/26 LOS. Unable to place pt on PE 3/11 sch due to no open appts. If any cancellation will add appt

## 2019-02-25 NOTE — Telephone Encounter (Signed)
Appointments scheduled AVS/Calendar printed per 2/26 los

## 2019-02-25 NOTE — Progress Notes (Signed)
Ok to treat today per MD Marin Olp, he is aware of HR too and a nebulizer has been ordered and given

## 2019-02-25 NOTE — Progress Notes (Signed)
Hematology and Oncology Follow Up Visit  Ryan Houston [id number] 1946-12-14 73 y.o. 02/25/2019   Principle Diagnosis:   Stage IIIB Bronchogenic carcinoma of the right lung -- probable adenocarcinoma  Past Therapy:    XRT -- s/p 6600 rad -- completed on 10/20/2018       Carbo/Taxol -- q week -- s/p cycle #1   Current Therapy:         Durvalumab -- q 2wk cycles -- s/p cycle #1    Interim History:  Ryan Houston is back for for follow-up.  Unfortunately, he has not been here for about 6 weeks.  I told him that we really are not going to be able to help him out if he cannot make his appointments.  He is dependent upon family members to bring him to the office.  Hopefully, he will be brought to the office on schedule.  He says he is coughing up a little bit of blood.  I suppose this probably should not surprise me.  He says his appetite is not all that good.  He does have access to some marijuana.  He says he is really not hurting all that much.  He is on Xtampza.  He says that he "misplaced" his oxycodone.  I told him that I cannot refill this until it is due on March 6.  He has had no problems with bowels or bladder.  I would have to say overall his performance status is probably ECOG 2.   Medications:  Current Outpatient Medications:  .  Cholecalciferol (VITAMIN D-3) 1000 UNITS CAPS, Take 1,000 Units by mouth daily. , Disp: , Rfl:  .  gabapentin (NEURONTIN) 300 MG capsule, Take 1 capsule (300 mg total) by mouth 3 (three) times daily., Disp: 90 capsule, Rfl: 6 .  hydrOXYzine (ATARAX/VISTARIL) 25 MG tablet, , Disp: , Rfl:  .  lidocaine-prilocaine (EMLA) cream, Apply to affected area once, Disp: 30 g, Rfl: 3 .  montelukast (SINGULAIR) 10 MG tablet, Take 10 mg by mouth., Disp: , Rfl:  .  omeprazole (PRILOSEC) 20 MG capsule, Take 1 capsule (20 mg total) by mouth daily., Disp: 30 capsule, Rfl: 4 .  oxyCODONE ER (XTAMPZA ER) 18 MG C12A, Take 18 mg by mouth every 12 (twelve) hours.,  Disp: 60 each, Rfl: 0 .  Oxycodone HCl 10 MG TABS, Take 1 tablet (10 mg total) by mouth every 6 (six) hours as needed., Disp: 90 tablet, Rfl: 0 .  vitamin B-12 (CYANOCOBALAMIN) 1000 MCG tablet, Take 1 tablet (1,000 mcg total) by mouth every other day., Disp: 30 tablet, Rfl: 6 .  mirabegron ER (MYRBETRIQ) 25 MG TB24 tablet, Take 1 tablet (25 mg total) by mouth daily., Disp: 30 tablet, Rfl: 4 .  senna-docusate (SENOKOT-S) 8.6-50 MG tablet, Take 1 tablet by mouth 2 (two) times daily. (Patient not taking: Reported on 02/25/2019), Disp: 120 tablet, Rfl: 3  Allergies: No Known Allergies  Past Medical History, Surgical history, Social history, and Family History were reviewed and updated.  Review of Systems: Review of Systems  Constitutional: Positive for appetite change and fatigue.  HENT:  Negative.   Eyes: Negative.   Respiratory: Positive for hemoptysis and shortness of breath.   Cardiovascular: Negative.   Gastrointestinal: Positive for constipation.  Endocrine: Negative.   Genitourinary: Negative.    Musculoskeletal: Positive for myalgias and neck pain.  Skin: Negative.   Neurological: Negative.   Hematological: Negative.   Psychiatric/Behavioral: Negative.     Physical Exam:  weight is 127  lb 12 oz (57.9 kg). His oral temperature is 97.8 F (36.6 C). His blood pressure is 100/62 and his pulse is 113 (abnormal). His respiration is 22 (abnormal) and oxygen saturation is 96%.   Wt Readings from Last 3 Encounters:  02/25/19 127 lb 12 oz (57.9 kg)  01/08/19 130 lb 12 oz (59.3 kg)  12/19/18 127 lb (57.6 kg)    Physical Exam Vitals signs reviewed.  HENT:     Head: Normocephalic and atraumatic.  Eyes:     Pupils: Pupils are equal, round, and reactive to light.  Neck:     Musculoskeletal: Normal range of motion.  Cardiovascular:     Rate and Rhythm: Normal rate and regular rhythm.     Heart sounds: Normal heart sounds.  Pulmonary:     Effort: Pulmonary effort is normal.      Breath sounds: Normal breath sounds.  Abdominal:     General: Bowel sounds are normal.     Palpations: Abdomen is soft.  Musculoskeletal: Normal range of motion.        General: No tenderness or deformity.  Lymphadenopathy:     Cervical: No cervical adenopathy.  Skin:    General: Skin is warm and dry.     Findings: No erythema or rash.  Neurological:     Mental Status: He is alert and oriented to person, place, and time.  Psychiatric:        Behavior: Behavior normal.        Thought Content: Thought content normal.        Judgment: Judgment normal.      Lab Results  Component Value Date   WBC 9.8 02/25/2019   HGB 14.6 02/25/2019   HCT 43.7 02/25/2019   MCV 93.6 02/25/2019   PLT 259 02/25/2019     Chemistry      Component Value Date/Time   NA 134 (L) 01/08/2019 1244   K 3.5 01/08/2019 1244   CL 102 01/08/2019 1244   CO2 28 01/08/2019 1244   BUN 15 01/08/2019 1244   CREATININE 1.05 01/08/2019 1244   CREATININE 1.31 (H) 11/09/2015 1528      Component Value Date/Time   CALCIUM 9.3 01/08/2019 1244   ALKPHOS 57 01/08/2019 1244   AST 25 01/08/2019 1244   ALT 14 01/08/2019 1244   BILITOT 0.5 01/08/2019 1244      Impression and Plan: Ryan Houston is a 73 year old African-American male.  He has locally advanced, inoperable, bronchogenic carcinoma.    Taking care of Ryan Houston is certainly somewhat a challenge.  Hopefully, he will be able to make his appointments as scheduled.  Hopefully, we will have him come back in 2 weeks for his next treatment.  I will give him a nebulizer treatment today.  We will plan to have him come back in 2 weeks.Marland Kitchen         Volanda Napoleon, MD 2/26/20208:09 AM

## 2019-02-25 NOTE — Patient Instructions (Signed)
Durvalumab injection What is this medicine? DURVALUMAB (dur VAL ue mab) is a monoclonal antibody. It is used to treat urothelial cancer and lung cancer. This medicine may be used for other purposes; ask your health care provider or pharmacist if you have questions. COMMON BRAND NAME(S): [id number] What should I tell my health care provider before I take this medicine? They need to know if you have any of these conditions: -diabetes -immune system problems -infection -inflammatory bowel disease -kidney disease -liver disease -lung or breathing disease -lupus -organ transplant -stomach or intestine problems -thyroid disease -an unusual or allergic reaction to durvalumab, other medicines, foods, dyes, or preservatives -pregnant or trying to get pregnant -breast-feeding How should I use this medicine? This medicine is for infusion into a vein. It is given by a health care professional in a hospital or clinic setting. A special MedGuide will be given to you before each treatment. Be sure to read this information carefully each time. Talk to your pediatrician regarding the use of this medicine in children. Special care may be needed. Overdosage: If you think you have taken too much of this medicine contact a poison control center or emergency room at once. NOTE: This medicine is only for you. Do not share this medicine with others. What if I miss a dose? It is important not to miss your dose. Call your doctor or health care professional if you are unable to keep an appointment. What may interact with this medicine? Interactions have not been studied. This list may not describe all possible interactions. Give your health care provider a list of all the medicines, herbs, non-prescription drugs, or dietary supplements you use. Also tell them if you smoke, drink alcohol, or use illegal drugs. Some items may interact with your medicine. What should I watch for while using this medicine? This drug  may make you feel generally unwell. Continue your course of treatment even though you feel ill unless your doctor tells you to stop. You may need blood work done while you are taking this medicine. Do not become pregnant while taking this medicine or for 3 months after stopping it. Women should inform their doctor if they wish to become pregnant or think they might be pregnant. There is a potential for serious side effects to an unborn child. Talk to your health care professional or pharmacist for more information. Do not breast-feed an infant while taking this medicine or for 3 months after stopping it. What side effects may I notice from receiving this medicine? Side effects that you should report to your doctor or health care professional as soon as possible: -allergic reactions like skin rash, itching or hives, swelling of the face, lips, or tongue -black, tarry stools -bloody or watery diarrhea -breathing problems -change in emotions or moods -change in sex drive -changes in vision -chest pain or chest tightness -chills -confusion -cough -facial flushing -fever -headache -signs and symptoms of high blood sugar such as dizziness; dry mouth; dry skin; fruity breath; nausea; stomach pain; increased hunger or thirst; increased urination -signs and symptoms of liver injury like dark yellow or brown urine; general ill feeling or flu-like symptoms; light-colored stools; loss of appetite; nausea; right upper belly pain; unusually weak or tired; yellowing of the eyes or skin -stomach pain -trouble passing urine or change in the amount of urine -weight gain or weight loss Side effects that usually do not require medical attention (report these to your doctor or health care professional if they continue or are   bothersome): -bone pain -constipation -loss of appetite -muscle pain -nausea -swelling of the ankles, feet, hands -tiredness This list may not describe all possible side effects. Call  your doctor for medical advice about side effects. You may report side effects to FDA at 1-800-FDA-1088. Where should I keep my medicine? This drug is given in a hospital or clinic and will not be stored at home. NOTE: This sheet is a summary. It may not cover all possible information. If you have questions about this medicine, talk to your doctor, pharmacist, or health care provider.  2019 Elsevier/Gold Standard (2017-02-26 19:25:04)

## 2019-02-26 LAB — HEPATITIS C ANTIBODY: HCV Ab: >11 s/co ratio — ABNORMAL HIGH (ref 0.0–0.9)

## 2019-02-27 LAB — HCV RNA QUANT RFLX ULTRA OR GENOTYP

## 2019-02-27 LAB — HCV RNA (INTERNATIONAL UNITS)
HCV log10: 7.863 log10 IU/mL
Hcv Rna (International Units): 73000000 IU/mL

## 2019-02-27 LAB — HEPATITIS C GENOTYPE

## 2019-03-05 ENCOUNTER — Ambulatory Visit: Payer: Medicare Other

## 2019-03-05 ENCOUNTER — Ambulatory Visit: Payer: Medicare Other | Admitting: Hematology & Oncology

## 2019-03-05 ENCOUNTER — Other Ambulatory Visit: Payer: Medicare Other

## 2019-03-11 ENCOUNTER — Inpatient Hospital Stay (HOSPITAL_BASED_OUTPATIENT_CLINIC_OR_DEPARTMENT_OTHER): Payer: Medicare Other | Admitting: Hematology & Oncology

## 2019-03-11 ENCOUNTER — Inpatient Hospital Stay: Payer: Medicare Other

## 2019-03-11 ENCOUNTER — Inpatient Hospital Stay: Payer: Medicare Other | Attending: Hematology & Oncology

## 2019-03-11 ENCOUNTER — Other Ambulatory Visit: Payer: Self-pay

## 2019-03-11 VITALS — HR 92

## 2019-03-11 DIAGNOSIS — R05 Cough: Secondary | ICD-10-CM | POA: Insufficient documentation

## 2019-03-11 DIAGNOSIS — Z5112 Encounter for antineoplastic immunotherapy: Secondary | ICD-10-CM

## 2019-03-11 DIAGNOSIS — C3431 Malignant neoplasm of lower lobe, right bronchus or lung: Secondary | ICD-10-CM

## 2019-03-11 DIAGNOSIS — Z79899 Other long term (current) drug therapy: Secondary | ICD-10-CM | POA: Diagnosis not present

## 2019-03-11 LAB — CBC WITH DIFFERENTIAL (CANCER CENTER ONLY)
Abs Immature Granulocytes: 0.04 10*3/uL (ref 0.00–0.07)
Basophils Absolute: 0.1 10*3/uL (ref 0.0–0.1)
Basophils Relative: 1 %
EOS ABS: 0.1 10*3/uL (ref 0.0–0.5)
Eosinophils Relative: 1 %
HCT: 40.8 % (ref 39.0–52.0)
Hemoglobin: 13.8 g/dL (ref 13.0–17.0)
Immature Granulocytes: 0 %
Lymphocytes Relative: 10 %
Lymphs Abs: 1.1 10*3/uL (ref 0.7–4.0)
MCH: 31.6 pg (ref 26.0–34.0)
MCHC: 33.8 g/dL (ref 30.0–36.0)
MCV: 93.4 fL (ref 80.0–100.0)
Monocytes Absolute: 1 10*3/uL (ref 0.1–1.0)
Monocytes Relative: 9 %
Neutro Abs: 8 10*3/uL — ABNORMAL HIGH (ref 1.7–7.7)
Neutrophils Relative %: 79 %
PLATELETS: 220 10*3/uL (ref 150–400)
RBC: 4.37 MIL/uL (ref 4.22–5.81)
RDW: 12.3 % (ref 11.5–15.5)
WBC: 10.2 10*3/uL (ref 4.0–10.5)
nRBC: 0 % (ref 0.0–0.2)

## 2019-03-11 LAB — CMP (CANCER CENTER ONLY)
ALT: 31 U/L (ref 0–44)
ANION GAP: 5 (ref 5–15)
AST: 40 U/L (ref 15–41)
Albumin: 3.3 g/dL — ABNORMAL LOW (ref 3.5–5.0)
Alkaline Phosphatase: 76 U/L (ref 38–126)
BUN: 14 mg/dL (ref 8–23)
CO2: 29 mmol/L (ref 22–32)
Calcium: 9.7 mg/dL (ref 8.9–10.3)
Chloride: 99 mmol/L (ref 98–111)
Creatinine: 1.04 mg/dL (ref 0.61–1.24)
GFR, Est AFR Am: >60 mL/min (ref >60)
GFR, Estimated: >60 mL/min (ref >60)
Glucose, Bld: 128 mg/dL — ABNORMAL HIGH (ref 70–99)
Potassium: 3.8 mmol/L (ref 3.5–5.1)
SODIUM: 133 mmol/L — AB (ref 135–145)
Total Bilirubin: 0.4 mg/dL (ref 0.3–1.2)
Total Protein: 9 g/dL — ABNORMAL HIGH (ref 6.5–8.1)

## 2019-03-11 MED ORDER — HEPARIN SOD (PORK) LOCK FLUSH 100 UNIT/ML IV SOLN
500.0000 [IU] | Freq: Once | INTRAVENOUS | Status: AC | PRN
  Administered 2019-03-11: 500 [IU]
  Filled 2019-03-11: qty 5

## 2019-03-11 MED ORDER — SODIUM CHLORIDE 0.9 % IV SOLN
620.0000 mg | Freq: Once | INTRAVENOUS | Status: AC
  Administered 2019-03-11: 620 mg via INTRAVENOUS
  Filled 2019-03-11: qty 2.4

## 2019-03-11 MED ORDER — SODIUM CHLORIDE 0.9% FLUSH
10.0000 mL | INTRAVENOUS | Status: DC | PRN
  Administered 2019-03-11: 10 mL
  Filled 2019-03-11: qty 10

## 2019-03-11 MED ORDER — SODIUM CHLORIDE 0.9 % IV SOLN
Freq: Once | INTRAVENOUS | Status: AC
  Administered 2019-03-11: 13:00:00 via INTRAVENOUS
  Filled 2019-03-11: qty 250

## 2019-03-11 NOTE — Progress Notes (Deleted)
Patient Name: Ryan Houston  Date of Birth: 06/23/1946  MRN: [id number]  PCP: Rogers Blocker, MD  Referring Provider: Rogers Blocker, MD   Patient Active Problem List   Diagnosis Date Noted  . Goals of care, counseling/discussion 08/22/2018  . Mediastinal adenopathy   . Primary malignant neoplasm of bronchus of right lower lobe ([place])   . Fever   . Malnutrition of moderate degree 08/08/2018  . Mediastinal mass 08/06/2018  . Hemoptysis 08/06/2018  . Hypercalcemia of malignancy 08/06/2018  . Lung mass 08/06/2018  . Chronic hepatitis C without hepatic coma ([place]) 06/13/2007  . ANXIETY 06/13/2007  . HEMORRHOIDS 06/13/2007  . GERD 06/13/2007  . DERMATITIS, CONTACT, NOS 06/13/2007  . SPONDYLOSIS, LUMBOSACRAL 06/13/2007  . HERNIATED DISC 06/13/2007   CC:  New patient for initial evaluation and management of chronic hepatitis c.   HPI/ROS:  Ryan Houston is a 73 y.o. male with pmhx inoperable Stage IIIB bronchogenic carcinoma of R lung ([place]) s/p XRT completed 10/20/2018 Durvalumab current chemo regimen.   Patient tested positive 2016 and again with positive and quite high viral load 73,000,000 copies in February of 2020. Genotype 1a. He has never had treatment for his hep c in the past. He has had some variable LFTs since summer 2019 with test results yesterday revealing normal AST/ALT. PLT 220K. CT scan of the abd/pel in Aug-2019 mild diffuse hepatitc steatosis, PET 45-6256 w/o hypermetabolic activity w/in the liver.   Hepatitis C-associated risk factors present are: {hep c risks pos:13207}. Patient denies {hep c risks neg:13208}. Patient {has/not:15037} had other studies performed. Results: {hep c studies:13209}. Patient {has/not:15037} had prior treatment for Hepatitis C. Patient {does/do/not:33181} have a past history of liver disease. Patient {does/do/not:33181} have a family history of liver disease. Patient {does/do/not:33181}  have associated signs or symptoms related to  liver disease.  Labs reviewed and confirm chronic hepatitis C with a positive viral load.   Records reviewed from ***  ***  Patient {does/does not:19866} have documented immunity to Hepatitis A. Patient {does/does not:19867} have documented immunity to Hepatitis B.    {ros - complete:22885} All other systems reviewed and are negative      Past Medical History:  Diagnosis Date  . Arthritis   . Cough with hemoptysis 08/06/2018  . GERD (gastroesophageal reflux disease)   . Goals of care, counseling/discussion 08/22/2018  . Hepatitis C   . Hypercalcemia of malignancy 08/06/2018  . Hypertension   . Mediastinal mass 08/06/2018    Prior to Admission medications   Medication Sig Start Date End Date Taking? Authorizing Provider  Cholecalciferol (VITAMIN D-3) 1000 UNITS CAPS Take 1,000 Units by mouth daily.     [provider]  gabapentin (NEURONTIN) 300 MG capsule Take 1 capsule (300 mg total) by mouth 3 (three) times daily. 10/29/18   Volanda Napoleon, MD  hydrOXYzine (ATARAX/VISTARIL) 25 MG tablet  02/08/19   [provider]  lidocaine-prilocaine (EMLA) cream Apply to affected area once 01/08/19   Volanda Napoleon, MD  mirabegron ER (MYRBETRIQ) 25 MG TB24 tablet Take 1 tablet (25 mg total) by mouth daily. 10/29/18   Volanda Napoleon, MD  montelukast (SINGULAIR) 10 MG tablet Take 10 mg by mouth. 12/01/18   [provider]  omeprazole (PRILOSEC) 20 MG capsule Take 1 capsule (20 mg total) by mouth daily. 10/30/18   Volanda Napoleon, MD  oxyCODONE ER (XTAMPZA ER) 18 MG C12A Take 18 mg by mouth every 12 (twelve) hours. 02/06/19  Volanda Napoleon, MD  Oxycodone HCl 10 MG TABS Take 1 tablet (10 mg total) by mouth every 6 (six) hours as needed. 03/06/19   Volanda Napoleon, MD  senna-docusate (SENOKOT-S) 8.6-50 MG tablet Take 1 tablet by mouth 2 (two) times daily. Patient not taking: Reported on 02/25/2019 10/29/18   Volanda Napoleon, MD  vitamin B-12 (CYANOCOBALAMIN) 1000 MCG tablet  Take 1 tablet (1,000 mcg total) by mouth every other day. 10/29/18   Volanda Napoleon, MD  prochlorperazine (COMPAZINE) 10 MG tablet Take 1 tablet (10 mg total) by mouth every 6 (six) hours as needed for nausea or vomiting. 09/03/18 10/29/18  Volanda Napoleon, MD    No Known Allergies  Social History   Tobacco Use  . Smoking status: Current Every Day Smoker    Packs/day: 1.00    Types: Cigarettes  . Smokeless tobacco: Never Used  Substance Use Topics  . Alcohol use: Yes    Alcohol/week: 0.0 standard drinks    Comment: beer a day  . Drug use: Yes    Types: Marijuana    Comment: 1-2x wk    No family history on file. ***  Objective:  There were no vitals filed for this visit. Constitutional: {EXAM; GENERAL APPEARANCE:5021} Eyes: anicteric Cardiovascular: {Mis exam cardio:32073} Respiratory: {Exam; lungs brief:12271} Gastrointestinal: {Exam; abdomen:5794::"Bowel sounds are normal","liver is not enlarged","spleen is not enlarged"} Musculoskeletal: {extremities:315109::"peripheral pulses normal, no pedal edema, no clubbing or cyanosis"} Skin: {Skin exam gi:12013}; no porphyria cutanea tarda Lymphatic: no cervical lymphadenopathy   Laboratory: Genotype:  Lab Results  Component Value Date   HCVGENOTYPE 1a 11/09/2015   HCV viral load:  Lab Results  Component Value Date   HCVQUANT 86,767,209 (H) 11/09/2015   Lab Results  Component Value Date   WBC 10.2 03/11/2019   HGB 13.8 03/11/2019   HCT 40.8 03/11/2019   MCV 93.4 03/11/2019   PLT 220 03/11/2019    Lab Results  Component Value Date   CREATININE 1.04 03/11/2019   BUN 14 03/11/2019   NA 133 (L) 03/11/2019   K 3.8 03/11/2019   CL 99 03/11/2019   CO2 29 03/11/2019    Lab Results  Component Value Date   ALT 31 03/11/2019   AST 40 03/11/2019   ALKPHOS 76 03/11/2019    Lab Results  Component Value Date   INR 1.17 08/08/2018   BILITOT 0.4 03/11/2019   ALBUMIN 3.3 (L) 03/11/2019    Labs and history reviewed  and show CHILD-PUGH *** 5-6 points: Child class A 7-9 points: Child class B 10-15 points: Child class C  Imaging:  Assessment & Plan:   Problem List Items Addressed This Visit    None      I spent 45 minutes with the patient including greater than 70% of time in face to face counsel of the patient re hepatitis c and the details described above and in coordination of their care.  Janene Madeira, MSN, NP-C [facility] [email or web address] Pager: [phone] Office: [phone]

## 2019-03-11 NOTE — Patient Instructions (Signed)

## 2019-03-11 NOTE — Progress Notes (Signed)
Hematology and Oncology Follow Up Visit  Ryan Houston [id number] June 13, 1946 73 y.o. 03/11/2019   Principle Diagnosis:   Stage IIIB Bronchogenic carcinoma of the right lung -- probable adenocarcinoma  Past Therapy:    XRT -- s/p 6600 rad -- completed on 10/20/2018       Carbo/Taxol -- q week -- s/p cycle #1   Current Therapy:         Durvalumab -- q 2wk cycles -- s/p cycle #2    Interim History:  Ryan Houston is back for for follow-up.  He comes in for his next cycle of Durvalumab.  He is holding his own.  He says he still does cough up a little bit of blood.  His appetite is a little bit better.  He does smoke a little bit of marijuana on occasion.  He is not having any issues with pain.  There is been no issues with nausea or vomiting.  He has had no headache.  Overall his performance status is probably ECOG 2.   Medications:  Current Outpatient Medications:  .  Cholecalciferol (VITAMIN D-3) 1000 UNITS CAPS, Take 1,000 Units by mouth daily. , Disp: , Rfl:  .  gabapentin (NEURONTIN) 300 MG capsule, Take 1 capsule (300 mg total) by mouth 3 (three) times daily., Disp: 90 capsule, Rfl: 6 .  hydrOXYzine (ATARAX/VISTARIL) 25 MG tablet, , Disp: , Rfl:  .  lidocaine-prilocaine (EMLA) cream, Apply to affected area once, Disp: 30 g, Rfl: 3 .  mirabegron ER (MYRBETRIQ) 25 MG TB24 tablet, Take 1 tablet (25 mg total) by mouth daily., Disp: 30 tablet, Rfl: 4 .  montelukast (SINGULAIR) 10 MG tablet, Take 10 mg by mouth., Disp: , Rfl:  .  omeprazole (PRILOSEC) 20 MG capsule, Take 1 capsule (20 mg total) by mouth daily., Disp: 30 capsule, Rfl: 4 .  oxyCODONE ER (XTAMPZA ER) 18 MG C12A, Take 18 mg by mouth every 12 (twelve) hours., Disp: 60 each, Rfl: 0 .  Oxycodone HCl 10 MG TABS, Take 1 tablet (10 mg total) by mouth every 6 (six) hours as needed., Disp: 90 tablet, Rfl: 0 .  senna-docusate (SENOKOT-S) 8.6-50 MG tablet, Take 1 tablet by mouth 2 (two) times daily. (Patient not taking:  Reported on 02/25/2019), Disp: 120 tablet, Rfl: 3 .  vitamin B-12 (CYANOCOBALAMIN) 1000 MCG tablet, Take 1 tablet (1,000 mcg total) by mouth every other day., Disp: 30 tablet, Rfl: 6 No current facility-administered medications for this visit.   Facility-Administered Medications Ordered in Other Visits:  .  sodium chloride flush (NS) 0.9 % injection 10 mL, 10 mL, Intracatheter, PRN, Volanda Napoleon, MD, 10 mL at 03/11/19 1410  Allergies: No Known Allergies  Past Medical History, Surgical history, Social history, and Family History were reviewed and updated.  Review of Systems: Review of Systems  Constitutional: Positive for appetite change and fatigue.  HENT:  Negative.   Eyes: Negative.   Respiratory: Positive for hemoptysis and shortness of breath.   Cardiovascular: Negative.   Gastrointestinal: Positive for constipation.  Endocrine: Negative.   Genitourinary: Negative.    Musculoskeletal: Positive for myalgias and neck pain.  Skin: Negative.   Neurological: Negative.   Hematological: Negative.   Psychiatric/Behavioral: Negative.     Physical Exam:  vitals were not taken for this visit.   Wt Readings from Last 3 Encounters:  02/25/19 127 lb 12 oz (57.9 kg)  01/08/19 130 lb 12 oz (59.3 kg)  12/19/18 127 lb (57.6 kg)    Physical Exam  Vitals signs reviewed.  HENT:     Head: Normocephalic and atraumatic.  Eyes:     Pupils: Pupils are equal, round, and reactive to light.  Neck:     Musculoskeletal: Normal range of motion.  Cardiovascular:     Rate and Rhythm: Normal rate and regular rhythm.     Heart sounds: Normal heart sounds.  Pulmonary:     Effort: Pulmonary effort is normal.     Breath sounds: Normal breath sounds.  Abdominal:     General: Bowel sounds are normal.     Palpations: Abdomen is soft.  Musculoskeletal: Normal range of motion.        General: No tenderness or deformity.  Lymphadenopathy:     Cervical: No cervical adenopathy.  Skin:    General:  Skin is warm and dry.     Findings: No erythema or rash.  Neurological:     Mental Status: He is alert and oriented to person, place, and time.  Psychiatric:        Behavior: Behavior normal.        Thought Content: Thought content normal.        Judgment: Judgment normal.      Lab Results  Component Value Date   WBC 10.2 03/11/2019   HGB 13.8 03/11/2019   HCT 40.8 03/11/2019   MCV 93.4 03/11/2019   PLT 220 03/11/2019     Chemistry      Component Value Date/Time   NA 133 (L) 03/11/2019 1040   K 3.8 03/11/2019 1040   CL 99 03/11/2019 1040   CO2 29 03/11/2019 1040   BUN 14 03/11/2019 1040   CREATININE 1.04 03/11/2019 1040   CREATININE 1.31 (H) 11/09/2015 1528      Component Value Date/Time   CALCIUM 9.7 03/11/2019 1040   ALKPHOS 76 03/11/2019 1040   AST 40 03/11/2019 1040   ALT 31 03/11/2019 1040   BILITOT 0.4 03/11/2019 1040      Impression and Plan: Ryan Houston is a 73 year old African-American male.  He has locally advanced, inoperable, bronchogenic carcinoma.    We will treat today with Durvalumab.  This will be his second treatment.  I really would like to try to get him 3 cycles of Durvalumab if I could before we need to rescan him.  At least, he did come back on schedule.  This I think is helpful.  I will plan to see him back in 2 more weeks.           Volanda Napoleon, MD 3/11/20204:39 PM

## 2019-03-12 ENCOUNTER — Telehealth: Payer: Self-pay | Admitting: Pharmacy Technician

## 2019-03-12 ENCOUNTER — Encounter: Payer: Medicare Other | Admitting: Infectious Diseases

## 2019-03-12 NOTE — Telephone Encounter (Signed)
[facility] Patient Advocate Encounter    Findings of the benefits investigation conducted this morning via test claims for the patient's upcoming appointment on 03/12/2019 @ 11:00am are as follows:   Insurance: [employer] active status Estimated copay amount will be determined once labs return and medication selected Prior Authorization: will begin insurance process once medication is prescribed Insurance [id number]  [facility] Patient Advocate will follow up once patient arrives for their appointment.  Bartholomew Crews, CPhT [facility] Patient [facility] Phone: [phone] Fax: [phone] 03/12/2019 10:38 AM

## 2019-03-18 ENCOUNTER — Encounter: Payer: Self-pay | Admitting: Nutrition

## 2019-03-18 NOTE — Progress Notes (Signed)
Patient was provided with one case of Ensure Enlive.

## 2019-03-24 ENCOUNTER — Telehealth: Payer: Self-pay | Admitting: Family

## 2019-03-24 NOTE — Telephone Encounter (Signed)
Prescreened No Symptoms- NO visitors patient is ok

## 2019-03-25 ENCOUNTER — Inpatient Hospital Stay: Payer: Medicare Other

## 2019-03-25 ENCOUNTER — Encounter: Payer: Self-pay | Admitting: Family

## 2019-03-25 ENCOUNTER — Inpatient Hospital Stay (HOSPITAL_BASED_OUTPATIENT_CLINIC_OR_DEPARTMENT_OTHER): Payer: Medicare Other | Admitting: Family

## 2019-03-25 ENCOUNTER — Other Ambulatory Visit: Payer: Self-pay | Admitting: Family

## 2019-03-25 ENCOUNTER — Telehealth: Payer: Self-pay | Admitting: Hematology & Oncology

## 2019-03-25 ENCOUNTER — Other Ambulatory Visit: Payer: Self-pay

## 2019-03-25 VITALS — BP 100/61 | HR 102 | Temp 98.6°F | Resp 19 | Ht 69.0 in | Wt 129.0 lb

## 2019-03-25 VITALS — HR 94

## 2019-03-25 DIAGNOSIS — R05 Cough: Secondary | ICD-10-CM | POA: Diagnosis not present

## 2019-03-25 DIAGNOSIS — Z79899 Other long term (current) drug therapy: Secondary | ICD-10-CM

## 2019-03-25 DIAGNOSIS — J9859 Other diseases of mediastinum, not elsewhere classified: Secondary | ICD-10-CM

## 2019-03-25 DIAGNOSIS — R042 Hemoptysis: Secondary | ICD-10-CM

## 2019-03-25 DIAGNOSIS — C3431 Malignant neoplasm of lower lobe, right bronchus or lung: Secondary | ICD-10-CM | POA: Diagnosis not present

## 2019-03-25 DIAGNOSIS — C3491 Malignant neoplasm of unspecified part of right bronchus or lung: Secondary | ICD-10-CM

## 2019-03-25 DIAGNOSIS — D500 Iron deficiency anemia secondary to blood loss (chronic): Secondary | ICD-10-CM

## 2019-03-25 DIAGNOSIS — Z5112 Encounter for antineoplastic immunotherapy: Secondary | ICD-10-CM | POA: Diagnosis not present

## 2019-03-25 HISTORY — DX: Iron deficiency anemia secondary to blood loss (chronic): D50.0

## 2019-03-25 LAB — CMP (CANCER CENTER ONLY)
ALT: 33 U/L (ref 0–44)
AST: 47 U/L — ABNORMAL HIGH (ref 15–41)
Albumin: 3.2 g/dL — ABNORMAL LOW (ref 3.5–5.0)
Alkaline Phosphatase: 71 U/L (ref 38–126)
Anion gap: 7 (ref 5–15)
BUN: 14 mg/dL (ref 8–23)
CALCIUM: 9.2 mg/dL (ref 8.9–10.3)
CO2: 27 mmol/L (ref 22–32)
CREATININE: 0.99 mg/dL (ref 0.61–1.24)
Chloride: 98 mmol/L (ref 98–111)
GFR, Est AFR Am: >60 mL/min (ref >60)
GFR, Estimated: >60 mL/min (ref >60)
Glucose, Bld: 122 mg/dL — ABNORMAL HIGH (ref 70–99)
Potassium: 4 mmol/L (ref 3.5–5.1)
Sodium: 132 mmol/L — ABNORMAL LOW (ref 135–145)
Total Bilirubin: 0.4 mg/dL (ref 0.3–1.2)
Total Protein: 8.5 g/dL — ABNORMAL HIGH (ref 6.5–8.1)

## 2019-03-25 LAB — CBC WITH DIFFERENTIAL (CANCER CENTER ONLY)
ABS IMMATURE GRANULOCYTES: 0.06 10*3/uL (ref 0.00–0.07)
BASOS PCT: 1 %
Basophils Absolute: 0.1 10*3/uL (ref 0.0–0.1)
Eosinophils Absolute: 0.2 10*3/uL (ref 0.0–0.5)
Eosinophils Relative: 2 %
HCT: 41.1 % (ref 39.0–52.0)
Hemoglobin: 13.5 g/dL (ref 13.0–17.0)
Immature Granulocytes: 1 %
Lymphocytes Relative: 12 %
Lymphs Abs: 1.2 10*3/uL (ref 0.7–4.0)
MCH: 30.3 pg (ref 26.0–34.0)
MCHC: 32.8 g/dL (ref 30.0–36.0)
MCV: 92.4 fL (ref 80.0–100.0)
Monocytes Absolute: 0.9 10*3/uL (ref 0.1–1.0)
Monocytes Relative: 10 %
Neutro Abs: 7.3 10*3/uL (ref 1.7–7.7)
Neutrophils Relative %: 74 %
Platelet Count: 257 10*3/uL (ref 150–400)
RBC: 4.45 MIL/uL (ref 4.22–5.81)
RDW: 12.2 % (ref 11.5–15.5)
WBC Count: 9.8 10*3/uL (ref 4.0–10.5)
nRBC: 0 % (ref 0.0–0.2)

## 2019-03-25 LAB — FERRITIN: FERRITIN: 357 ng/mL — AB (ref 24–336)

## 2019-03-25 LAB — LACTATE DEHYDROGENASE: LDH: 231 U/L — ABNORMAL HIGH (ref 98–192)

## 2019-03-25 LAB — IRON AND TIBC
Iron: 43 ug/dL (ref 42–163)
Saturation Ratios: 16 % — ABNORMAL LOW (ref 20–55)
TIBC: 273 ug/dL (ref 202–409)
UIBC: 230 ug/dL (ref 117–376)

## 2019-03-25 MED ORDER — SODIUM CHLORIDE 0.9% FLUSH
10.0000 mL | INTRAVENOUS | Status: DC | PRN
  Administered 2019-03-25: 10 mL
  Filled 2019-03-25: qty 10

## 2019-03-25 MED ORDER — SODIUM CHLORIDE 0.9 % IV SOLN
Freq: Once | INTRAVENOUS | Status: DC
  Filled 2019-03-25: qty 250

## 2019-03-25 MED ORDER — SODIUM CHLORIDE 0.9 % IV SOLN
Freq: Once | INTRAVENOUS | Status: AC
  Administered 2019-03-25: 12:00:00 via INTRAVENOUS
  Filled 2019-03-25: qty 250

## 2019-03-25 MED ORDER — BENZONATATE 100 MG PO CAPS: 100.0000 mg | ORAL_CAPSULE | Freq: Three times a day (TID) | ORAL | 2 refills | Status: DC | PRN

## 2019-03-25 MED ORDER — SODIUM CHLORIDE 0.9 % IV SOLN
620.0000 mg | Freq: Once | INTRAVENOUS | Status: AC
  Administered 2019-03-25: 620 mg via INTRAVENOUS
  Filled 2019-03-25: qty 2.4

## 2019-03-25 MED ORDER — IPRATROPIUM-ALBUTEROL 0.5-2.5 (3) MG/3ML IN SOLN: 3.0000 mL | Freq: Three times a day (TID) | RESPIRATORY_TRACT | 2 refills | Status: DC

## 2019-03-25 MED ORDER — SODIUM CHLORIDE 0.9 % IV SOLN
750.0000 mg | Freq: Once | INTRAVENOUS | Status: DC
  Filled 2019-03-25: qty 15

## 2019-03-25 MED ORDER — HEPARIN SOD (PORK) LOCK FLUSH 100 UNIT/ML IV SOLN
500.0000 [IU] | Freq: Once | INTRAVENOUS | Status: AC | PRN
  Administered 2019-03-25: 500 [IU]
  Filled 2019-03-25: qty 5

## 2019-03-25 MED ORDER — OXYCODONE ER 18 MG PO C12A: 18.0000 mg | EXTENDED_RELEASE_CAPSULE | Freq: Two times a day (BID) | ORAL | 0 refills | Status: DC

## 2019-03-25 NOTE — Progress Notes (Signed)
Hematology and Oncology Follow Up Visit  Ryan Houston [id number] 03-26-1946 73 y.o. 03/25/2019   Principle Diagnosis:  Stage IIIB Bronchogenic carcinoma of the right lung -- probable adenocarcinoma  Past Therapy:             XRT - s/p 6600 rad - completed on 10/20/2018 Carbo/Taxol - q week - s/p cycle 1     Current Therapy:   Durvalumab - q 2 wk cycles - s/p cycle 3   Interim History:  Ryan Houston is here today for follow-up and cycle 4 of treatment.  He still has the cough and states that with this his chest gets sore. He has had minimal blood in his sputum.  He uses his nebulizer as needed and states that he needs this refilled.  He has 2L [place] supplemental O2 to use at home as needed. He states that his SOB has been minimal.  He states that he stays up during the night and sleeps throughout the day.  No fever, n/v, rash, dizziness, chest pain, palpitations, abdominal pain or changes in bowel or bladder habits.  He has an overactive bladder and takes Myrbetriq.  He will take a stool softener as needed for constipation.  His appetite comes and goes but he states that he is staying well hydrated. His weight is stable and up 2 lbs since his last visit.   ECOG Performance Status: 1 - Symptomatic but completely ambulatory  Medications:  Allergies as of 03/25/2019   No Known Allergies     Medication List       Accurate as of March 25, 2019 10:45 AM. Always use your most recent med list.        gabapentin 300 MG capsule Commonly known as:  NEURONTIN Take 1 capsule (300 mg total) by mouth 3 (three) times daily.   hydrOXYzine 25 MG tablet Commonly known as:  ATARAX/VISTARIL   lidocaine-prilocaine cream Commonly known as:  EMLA Apply to affected area once   mirabegron ER 25 MG Tb24 tablet Commonly known as:  MYRBETRIQ Take 1 tablet (25 mg total) by mouth daily.   montelukast 10 MG tablet Commonly known as:  SINGULAIR Take 10 mg by mouth.   omeprazole 20 MG  capsule Commonly known as:  PRILOSEC Take 1 capsule (20 mg total) by mouth daily.   oxyCODONE ER 18 MG C12a Commonly known as:  Xtampza ER Take 18 mg by mouth every 12 (twelve) hours.   Oxycodone HCl 10 MG Tabs Take 1 tablet (10 mg total) by mouth every 6 (six) hours as needed.   senna-docusate 8.6-50 MG tablet Commonly known as:  Senokot-S Take 1 tablet by mouth 2 (two) times daily.   vitamin B-12 1000 MCG tablet Commonly known as:  CYANOCOBALAMIN Take 1 tablet (1,000 mcg total) by mouth every other day.   Vitamin D-3 25 MCG (1000 UT) Caps Take 1,000 Units by mouth daily.       Allergies: No Known Allergies  Past Medical History, Surgical history, Social history, and Family History were reviewed and updated.  Review of Systems: All other 10 point review of systems is negative.   Physical Exam:  height is 5\' 9"  (1.753 m) and weight is 129 lb (58.5 kg). His oral temperature is 98.6 F (37 C). His blood pressure is 100/61 and his pulse is 102 (abnormal). His respiration is 19 and oxygen saturation is 97%.   Wt Readings from Last 3 Encounters:  03/25/19 129 lb (58.5 kg)  03/25/19 129  lb (58.5 kg)  02/25/19 127 lb 12 oz (57.9 kg)    Ocular: Sclerae unicteric, pupils equal, round and reactive to light Ear-nose-throat: Oropharynx clear, dentition fair Lymphatic: No cervical, supraclavicular or axillary adenopathy Lungs no rales or rhonchi, good excursion bilaterally Heart regular rate and rhythm, no murmur appreciated Abd soft, nontender, positive bowel sounds, no liver or spleen tip palpated on exam, no fluid wave  MSK no focal spinal tenderness, no joint edema Neuro: non-focal, well-oriented, appropriate affect Breasts: Deferred   Lab Results  Component Value Date   WBC 9.8 03/25/2019   HGB 13.5 03/25/2019   HCT 41.1 03/25/2019   MCV 92.4 03/25/2019   PLT 257 03/25/2019   Lab Results  Component Value Date   FERRITIN 450 (H) 02/25/2019   IRON 78 02/25/2019    TIBC 335 02/25/2019   UIBC 257 02/25/2019   IRONPCTSAT 23 02/25/2019   Lab Results  Component Value Date   RBC 4.45 03/25/2019   No results found for: KPAFRELGTCHN, LAMBDASER, KAPLAMBRATIO No results found for: IGGSERUM, IGA, IGMSERUM No results found for: Kathrynn Ducking, MSPIKE, SPEI   Chemistry      Component Value Date/Time   NA 132 (L) 03/25/2019 1010   K 4.0 03/25/2019 1010   CL 98 03/25/2019 1010   CO2 27 03/25/2019 1010   BUN 14 03/25/2019 1010   CREATININE 0.99 03/25/2019 1010   CREATININE 1.31 (H) 11/09/2015 1528      Component Value Date/Time   CALCIUM 9.2 03/25/2019 1010   ALKPHOS 71 03/25/2019 1010   AST 47 (H) 03/25/2019 1010   ALT 33 03/25/2019 1010   BILITOT 0.4 03/25/2019 1010       Impression and Plan: Ryan Houston is a 73 yo African American male with locally advanced, inoperable, bronchogenic carcinoma.   We will proceed with treatment today as planned.  We will repeat a PET scan thew Monday before his follow-up and next cycle in 2 weeks on Wednesday.  Tessalon Perles ordered for his cough.  Oxycodone ER refilled today as well as his duoneb.  We will see him back in 2 weeks.  He will contact our office with any questions or concerns. We can certainly see him sooner if need be.     Laverna Peace, NP 3/25/202010:45 AM

## 2019-03-25 NOTE — Telephone Encounter (Signed)
I called and spoke with patient and gave him all information for PET Scan, instructions/date/time/location.  I also mailed a letter with this information as well along with the phone number to Central Scheduling should he have any questions.  He voiced understanding of this information

## 2019-03-25 NOTE — Patient Instructions (Signed)
Durvalumab injection What is this medicine? DURVALUMAB (dur VAL ue mab) is a monoclonal antibody. It is used to treat urothelial cancer and lung cancer. This medicine may be used for other purposes; ask your health care provider or pharmacist if you have questions. COMMON BRAND NAME(S): [id number] What should I tell my health care provider before I take this medicine? They need to know if you have any of these conditions: -diabetes -immune system problems -infection -inflammatory bowel disease -kidney disease -liver disease -lung or breathing disease -lupus -organ transplant -stomach or intestine problems -thyroid disease -an unusual or allergic reaction to durvalumab, other medicines, foods, dyes, or preservatives -pregnant or trying to get pregnant -breast-feeding How should I use this medicine? This medicine is for infusion into a vein. It is given by a health care professional in a hospital or clinic setting. A special MedGuide will be given to you before each treatment. Be sure to read this information carefully each time. Talk to your pediatrician regarding the use of this medicine in children. Special care may be needed. Overdosage: If you think you have taken too much of this medicine contact a poison control center or emergency room at once. NOTE: This medicine is only for you. Do not share this medicine with others. What if I miss a dose? It is important not to miss your dose. Call your doctor or health care professional if you are unable to keep an appointment. What may interact with this medicine? Interactions have not been studied. This list may not describe all possible interactions. Give your health care provider a list of all the medicines, herbs, non-prescription drugs, or dietary supplements you use. Also tell them if you smoke, drink alcohol, or use illegal drugs. Some items may interact with your medicine. What should I watch for while using this medicine? This drug  may make you feel generally unwell. Continue your course of treatment even though you feel ill unless your doctor tells you to stop. You may need blood work done while you are taking this medicine. Do not become pregnant while taking this medicine or for 3 months after stopping it. Women should inform their doctor if they wish to become pregnant or think they might be pregnant. There is a potential for serious side effects to an unborn child. Talk to your health care professional or pharmacist for more information. Do not breast-feed an infant while taking this medicine or for 3 months after stopping it. What side effects may I notice from receiving this medicine? Side effects that you should report to your doctor or health care professional as soon as possible: -allergic reactions like skin rash, itching or hives, swelling of the face, lips, or tongue -black, tarry stools -bloody or watery diarrhea -breathing problems -change in emotions or moods -change in sex drive -changes in vision -chest pain or chest tightness -chills -confusion -cough -facial flushing -fever -headache -signs and symptoms of high blood sugar such as dizziness; dry mouth; dry skin; fruity breath; nausea; stomach pain; increased hunger or thirst; increased urination -signs and symptoms of liver injury like dark yellow or brown urine; general ill feeling or flu-like symptoms; light-colored stools; loss of appetite; nausea; right upper belly pain; unusually weak or tired; yellowing of the eyes or skin -stomach pain -trouble passing urine or change in the amount of urine -weight gain or weight loss Side effects that usually do not require medical attention (report these to your doctor or health care professional if they continue or are   bothersome): -bone pain -constipation -loss of appetite -muscle pain -nausea -swelling of the ankles, feet, hands -tiredness This list may not describe all possible side effects. Call  your doctor for medical advice about side effects. You may report side effects to FDA at 1-800-FDA-1088. Where should I keep my medicine? This drug is given in a hospital or clinic and will not be stored at home. NOTE: This sheet is a summary. It may not cover all possible information. If you have questions about this medicine, talk to your doctor, pharmacist, or health care provider.  2019 Elsevier/Gold Standard (2017-02-26 19:25:04)

## 2019-03-25 NOTE — Progress Notes (Signed)
Okay to get Injectafer today per Otilio Carpen.

## 2019-03-31 ENCOUNTER — Other Ambulatory Visit: Payer: Self-pay | Admitting: *Deleted

## 2019-03-31 DIAGNOSIS — R918 Other nonspecific abnormal finding of lung field: Secondary | ICD-10-CM

## 2019-03-31 DIAGNOSIS — J9859 Other diseases of mediastinum, not elsewhere classified: Secondary | ICD-10-CM

## 2019-03-31 MED ORDER — OXYCODONE HCL 10 MG PO TABS: 10.0000 mg | ORAL_TABLET | Freq: Four times a day (QID) | ORAL | 0 refills | Status: DC | PRN

## 2019-04-07 ENCOUNTER — Ambulatory Visit (HOSPITAL_COMMUNITY): Payer: Medicare Other

## 2019-04-08 ENCOUNTER — Telehealth: Payer: Self-pay | Admitting: *Deleted

## 2019-04-08 ENCOUNTER — Inpatient Hospital Stay: Payer: Medicare Other

## 2019-04-08 ENCOUNTER — Inpatient Hospital Stay: Payer: Medicare Other | Admitting: Hematology & Oncology

## 2019-04-08 ENCOUNTER — Ambulatory Visit: Payer: Medicare Other

## 2019-04-08 ENCOUNTER — Encounter (HOSPITAL_COMMUNITY): Payer: Self-pay

## 2019-04-08 ENCOUNTER — Emergency Department (HOSPITAL_COMMUNITY): Payer: Medicare Other

## 2019-04-08 ENCOUNTER — Other Ambulatory Visit: Payer: Self-pay

## 2019-04-08 ENCOUNTER — Inpatient Hospital Stay (HOSPITAL_COMMUNITY)
Admission: EM | Admit: 2019-04-08 | Discharge: 2019-04-12 | DRG: 194 | Disposition: A | Discharge status: Hospice | Payer: Medicare Other | Attending: Internal Medicine | Admitting: Internal Medicine

## 2019-04-08 DIAGNOSIS — F17210 Nicotine dependence, cigarettes, uncomplicated: Secondary | ICD-10-CM | POA: Diagnosis not present

## 2019-04-08 DIAGNOSIS — Z03818 Encounter for observation for suspected exposure to other biological agents ruled out: Secondary | ICD-10-CM

## 2019-04-08 DIAGNOSIS — C779 Secondary and unspecified malignant neoplasm of lymph node, unspecified: Secondary | ICD-10-CM | POA: Diagnosis present

## 2019-04-08 DIAGNOSIS — C782 Secondary malignant neoplasm of pleura: Secondary | ICD-10-CM | POA: Diagnosis present

## 2019-04-08 DIAGNOSIS — R112 Nausea with vomiting, unspecified: Secondary | ICD-10-CM | POA: Diagnosis present

## 2019-04-08 DIAGNOSIS — Z9119 Patient's noncompliance with other medical treatment and regimen: Secondary | ICD-10-CM

## 2019-04-08 DIAGNOSIS — J910 Malignant pleural effusion: Secondary | ICD-10-CM | POA: Diagnosis present

## 2019-04-08 DIAGNOSIS — Z20828 Contact with and (suspected) exposure to other viral communicable diseases: Secondary | ICD-10-CM | POA: Diagnosis present

## 2019-04-08 DIAGNOSIS — E440 Moderate protein-calorie malnutrition: Secondary | ICD-10-CM | POA: Diagnosis present

## 2019-04-08 DIAGNOSIS — J188 Other pneumonia, unspecified organism: Principal | ICD-10-CM | POA: Diagnosis present

## 2019-04-08 DIAGNOSIS — Z66 Do not resuscitate: Secondary | ICD-10-CM | POA: Diagnosis present

## 2019-04-08 DIAGNOSIS — J181 Lobar pneumonia, unspecified organism: Secondary | ICD-10-CM

## 2019-04-08 DIAGNOSIS — J90 Pleural effusion, not elsewhere classified: Secondary | ICD-10-CM

## 2019-04-08 DIAGNOSIS — Z681 Body mass index (BMI) 19 or less, adult: Secondary | ICD-10-CM

## 2019-04-08 DIAGNOSIS — C3431 Malignant neoplasm of lower lobe, right bronchus or lung: Secondary | ICD-10-CM

## 2019-04-08 DIAGNOSIS — E871 Hypo-osmolality and hyponatremia: Secondary | ICD-10-CM | POA: Diagnosis present

## 2019-04-08 DIAGNOSIS — K219 Gastro-esophageal reflux disease without esophagitis: Secondary | ICD-10-CM | POA: Diagnosis present

## 2019-04-08 DIAGNOSIS — B182 Chronic viral hepatitis C: Secondary | ICD-10-CM | POA: Diagnosis not present

## 2019-04-08 DIAGNOSIS — J189 Pneumonia, unspecified organism: Secondary | ICD-10-CM | POA: Diagnosis not present

## 2019-04-08 DIAGNOSIS — I2584 Coronary atherosclerosis due to calcified coronary lesion: Secondary | ICD-10-CM | POA: Diagnosis present

## 2019-04-08 DIAGNOSIS — I10 Essential (primary) hypertension: Secondary | ICD-10-CM | POA: Diagnosis present

## 2019-04-08 DIAGNOSIS — R64 Cachexia: Secondary | ICD-10-CM | POA: Diagnosis present

## 2019-04-08 DIAGNOSIS — C3491 Malignant neoplasm of unspecified part of right bronchus or lung: Secondary | ICD-10-CM | POA: Diagnosis not present

## 2019-04-08 DIAGNOSIS — Y95 Nosocomial condition: Secondary | ICD-10-CM | POA: Diagnosis present

## 2019-04-08 DIAGNOSIS — Z79899 Other long term (current) drug therapy: Secondary | ICD-10-CM | POA: Diagnosis not present

## 2019-04-08 DIAGNOSIS — I2510 Atherosclerotic heart disease of native coronary artery without angina pectoris: Secondary | ICD-10-CM | POA: Diagnosis present

## 2019-04-08 DIAGNOSIS — M1990 Unspecified osteoarthritis, unspecified site: Secondary | ICD-10-CM | POA: Diagnosis present

## 2019-04-08 HISTORY — DX: Malignant (primary) neoplasm, unspecified: C80.1

## 2019-04-08 LAB — CBC WITH DIFFERENTIAL/PLATELET
Abs Immature Granulocytes: 0.11 10*3/uL — ABNORMAL HIGH (ref 0.00–0.07)
Basophils Absolute: 0 10*3/uL (ref 0.0–0.1)
Basophils Relative: 0 %
Eosinophils Absolute: 0.1 10*3/uL (ref 0.0–0.5)
Eosinophils Relative: 1 %
HCT: 35.3 % — ABNORMAL LOW (ref 39.0–52.0)
Hemoglobin: 11.4 g/dL — ABNORMAL LOW (ref 13.0–17.0)
Immature Granulocytes: 1 %
Lymphocytes Relative: 9 %
Lymphs Abs: 1 10*3/uL (ref 0.7–4.0)
MCH: 30.3 pg (ref 26.0–34.0)
MCHC: 32.3 g/dL (ref 30.0–36.0)
MCV: 93.9 fL (ref 80.0–100.0)
Monocytes Absolute: 1.3 10*3/uL — ABNORMAL HIGH (ref 0.1–1.0)
Monocytes Relative: 12 %
Neutro Abs: 8.5 10*3/uL — ABNORMAL HIGH (ref 1.7–7.7)
Neutrophils Relative %: 77 %
Platelets: 269 10*3/uL (ref 150–400)
RBC: 3.76 MIL/uL — ABNORMAL LOW (ref 4.22–5.81)
RDW: 12.4 % (ref 11.5–15.5)
WBC: 11.1 10*3/uL — ABNORMAL HIGH (ref 4.0–10.5)
nRBC: 0 % (ref 0.0–0.2)

## 2019-04-08 LAB — COMPREHENSIVE METABOLIC PANEL
ALT: 15 U/L (ref 0–44)
AST: 28 U/L (ref 15–41)
Albumin: 2.2 g/dL — ABNORMAL LOW (ref 3.5–5.0)
Alkaline Phosphatase: 70 U/L (ref 38–126)
Anion gap: 6 (ref 5–15)
BUN: 12 mg/dL (ref 8–23)
CO2: 28 mmol/L (ref 22–32)
Calcium: 8.2 mg/dL — ABNORMAL LOW (ref 8.9–10.3)
Chloride: 98 mmol/L (ref 98–111)
Creatinine, Ser: 0.85 mg/dL (ref 0.61–1.24)
GFR calc Af Amer: >60 mL/min (ref >60)
GFR calc non Af Amer: >60 mL/min (ref >60)
Glucose, Bld: 148 mg/dL — ABNORMAL HIGH (ref 70–99)
Potassium: 4 mmol/L (ref 3.5–5.1)
Sodium: 132 mmol/L — ABNORMAL LOW (ref 135–145)
Total Bilirubin: 0.4 mg/dL (ref 0.3–1.2)
Total Protein: 7.7 g/dL (ref 6.5–8.1)

## 2019-04-08 LAB — URINALYSIS, ROUTINE W REFLEX MICROSCOPIC
Bilirubin Urine: NEGATIVE
Glucose, UA: NEGATIVE mg/dL
Hgb urine dipstick: NEGATIVE
Ketones, ur: NEGATIVE mg/dL
Leukocytes,Ua: NEGATIVE
Nitrite: NEGATIVE
Protein, ur: NEGATIVE mg/dL
Specific Gravity, Urine: 1.013 (ref 1.005–1.030)
pH: 6 (ref 5.0–8.0)

## 2019-04-08 LAB — LIPASE, BLOOD: Lipase: 33 U/L (ref 11–51)

## 2019-04-08 LAB — TROPONIN I: Troponin I: <0.03 ng/mL (ref <0.03)

## 2019-04-08 MED ORDER — VANCOMYCIN HCL IN DEXTROSE 1-5 GM/200ML-% IV SOLN
1000.0000 mg | INTRAVENOUS | Status: DC
  Administered 2019-04-10: 1000 mg via INTRAVENOUS
  Filled 2019-04-08: qty 200

## 2019-04-08 MED ORDER — CHLORPROMAZINE HCL 25 MG PO TABS
25.0000 mg | ORAL_TABLET | Freq: Once | ORAL | Status: AC
  Administered 2019-04-08: 25 mg via ORAL
  Filled 2019-04-08: qty 1

## 2019-04-08 MED ORDER — SODIUM CHLORIDE 0.9 % IV BOLUS
1000.0000 mL | Freq: Once | INTRAVENOUS | Status: AC
  Administered 2019-04-08: 1000 mL via INTRAVENOUS

## 2019-04-08 MED ORDER — PIPERACILLIN-TAZOBACTAM 3.375 G IVPB 30 MIN
3.3750 g | Freq: Once | INTRAVENOUS | Status: AC
  Administered 2019-04-08: 3.375 g via INTRAVENOUS
  Filled 2019-04-08: qty 50

## 2019-04-08 MED ORDER — VANCOMYCIN HCL 10 G IV SOLR
1250.0000 mg | Freq: Once | INTRAVENOUS | Status: AC
  Administered 2019-04-08: 1250 mg via INTRAVENOUS
  Filled 2019-04-08: qty 1250

## 2019-04-08 MED ORDER — LIDOCAINE VISCOUS HCL 2 % MT SOLN
15.0000 mL | Freq: Once | OROMUCOSAL | Status: AC
  Administered 2019-04-08: 15 mL via ORAL
  Filled 2019-04-08: qty 15

## 2019-04-08 MED ORDER — ONDANSETRON HCL 4 MG/2ML IJ SOLN
4.0000 mg | Freq: Once | INTRAMUSCULAR | Status: AC
  Administered 2019-04-08: 4 mg via INTRAVENOUS
  Filled 2019-04-08: qty 2

## 2019-04-08 MED ORDER — ALUM & MAG HYDROXIDE-SIMETH 200-200-20 MG/5ML PO SUSP
30.0000 mL | Freq: Once | ORAL | Status: AC
  Administered 2019-04-08: 30 mL via ORAL
  Filled 2019-04-08: qty 30

## 2019-04-08 NOTE — ED Provider Notes (Signed)
[facility] DEPT Provider Note   CSN: [id number] Arrival date & time: 04/08/19  1628    History   Chief Complaint Chief Complaint  Patient presents with  . Fever  . Shortness of Breath    Cancer pt, also with cough    HPI Ryan Houston is a 73 y.o. male.     73 yo M with a chief complaint of difficulty breathing.  Happens every so often.  He has a sensation that he is having trouble breathing and has to clear his throat and then is able to breathe okay again.  That having more frequently.  Going on for at least the past week.  He has had a chronic cough for many many months that is unchanged.  He had a fever yesterday of 102.  Has had some nausea and vomiting going on for about a week as well.  He felt too unwell to go to immunotherapy today and called his oncology office who suggested he come to the ED for evaluation.  Has chronic shortness of breath he feels this not significantly changed.  On 2 L of oxygen when he needs it at home.  The history is provided by the patient.  Fever  Associated symptoms: chest pain, nausea and vomiting   Associated symptoms: no chills, no confusion, no congestion, no diarrhea, no headaches, no myalgias and no rash   Shortness of Breath  Associated symptoms: chest pain, fever and vomiting   Associated symptoms: no abdominal pain, no headaches and no rash   Illness  Severity:  Mild Onset quality:  Gradual Duration:  1 week Timing:  Constant Progression:  Worsening Chronicity:  New Associated symptoms: chest pain, fever, nausea, shortness of breath and vomiting   Associated symptoms: no abdominal pain, no congestion, no diarrhea, no headaches, no myalgias and no rash     Past Medical History:  Diagnosis Date  . Arthritis   . Cancer ([place])    lung  . Cough with hemoptysis 08/06/2018  . GERD (gastroesophageal reflux disease)   . Goals of care, counseling/discussion 08/22/2018  . Hepatitis C   . Hypercalcemia of  malignancy 08/06/2018  . Hypertension   . Iron deficiency anemia due to chronic blood loss 03/25/2019  . Mediastinal mass 08/06/2018    Patient Active Problem List   Diagnosis Date Noted  . Iron deficiency anemia due to chronic blood loss 03/25/2019  . Goals of care, counseling/discussion 08/22/2018  . Mediastinal adenopathy   . Primary malignant neoplasm of bronchus of right lower lobe ([place])   . Fever   . Malnutrition of moderate degree 08/08/2018  . Mediastinal mass 08/06/2018  . Cough with hemoptysis 08/06/2018  . Hypercalcemia of malignancy 08/06/2018  . Lung mass 08/06/2018  . Chronic hepatitis C without hepatic coma ([place]) 06/13/2007  . ANXIETY 06/13/2007  . HEMORRHOIDS 06/13/2007  . GERD 06/13/2007  . DERMATITIS, CONTACT, NOS 06/13/2007  . SPONDYLOSIS, LUMBOSACRAL 06/13/2007  . HERNIATED DISC 06/13/2007    Past Surgical History:  Procedure Laterality Date  . BACK SURGERY    . BRONCHIAL WASHINGS  08/13/2018   Procedure: BRONCHIAL WASHINGS;  Surgeon: Garner Nash, DO;  Location: [facility];  Service: Cardiopulmonary;;  . ENDOBRONCHIAL ULTRASOUND Bilateral 08/13/2018   Procedure: ENDOBRONCHIAL ULTRASOUND;  Surgeon: Garner Nash, DO;  Location: [facility];  Service: Cardiopulmonary;  Laterality: Bilateral;  . FINE NEEDLE ASPIRATION  08/13/2018   Procedure: FINE NEEDLE ASPIRATION (FNA) LINEAR;  Surgeon: Garner Nash, DO;  Location: [facility];  Service: Cardiopulmonary;;  . IR IMAGING GUIDED PORT INSERTION  08/08/2018  . VIDEO BRONCHOSCOPY Bilateral 08/07/2018   Procedure: VIDEO BRONCHOSCOPY WITH FLUORO;  Surgeon: Chesley Mires, MD;  Location: [facility];  Service: Endoscopy;  Laterality: Bilateral;  . VIDEO BRONCHOSCOPY  08/13/2018   Procedure: VIDEO BRONCHOSCOPY;  Surgeon: Garner Nash, DO;  Location: [facility];  Service: Cardiopulmonary;;        Home Medications    Prior to Admission medications   Medication Sig Start Date End Date Taking? Authorizing  Provider  Cholecalciferol (VITAMIN D-3) 1000 UNITS CAPS Take 2,000 Units by mouth daily.    Yes [provider]  gabapentin (NEURONTIN) 300 MG capsule Take 1 capsule (300 mg total) by mouth 3 (three) times daily. 10/29/18  Yes Volanda Napoleon, MD  hydrOXYzine (ATARAX/VISTARIL) 25 MG tablet Take 25 mg by mouth every 8 (eight) hours as needed for anxiety.  02/08/19  Yes [provider]  ipratropium-albuterol (DUONEB) 0.5-2.5 (3) MG/3ML SOLN USE 3 ML VIA NEBULIZER THREE TIMES DAILY Patient taking differently: Take 3 mLs by nebulization every 2 (two) hours as needed (wheezing/sob).  03/25/19  Yes [place], Holli Humbles, NP  lidocaine-prilocaine (EMLA) cream Apply to affected area once 01/08/19  Yes Ennever, Rudell Cobb, MD  mirabegron ER (MYRBETRIQ) 25 MG TB24 tablet Take 1 tablet (25 mg total) by mouth daily. 10/29/18  Yes Volanda Napoleon, MD  montelukast (SINGULAIR) 10 MG tablet Take 10 mg by mouth. 12/01/18  Yes [provider]  omeprazole (PRILOSEC) 20 MG capsule Take 1 capsule (20 mg total) by mouth daily. 10/30/18  Yes Volanda Napoleon, MD  oxyCODONE ER [facility] ER) 18 MG C12A Take 18 mg by mouth every 12 (twelve) hours. 03/25/19  Yes [place], Holli Humbles, NP  Oxycodone HCl 10 MG TABS Take 1 tablet (10 mg total) by mouth every 6 (six) hours as needed. Patient taking differently: Take 10 mg by mouth every 6 (six) hours as needed (pain).  03/31/19  Yes Ennever, Rudell Cobb, MD  senna-docusate (SENOKOT-S) 8.6-50 MG tablet Take 1 tablet by mouth 2 (two) times daily. 10/29/18  Yes Ennever, Rudell Cobb, MD  vitamin B-12 (CYANOCOBALAMIN) 1000 MCG tablet Take 1 tablet (1,000 mcg total) by mouth every other day. 10/29/18  Yes Ennever, Rudell Cobb, MD  benzonatate (TESSALON PERLES) 100 MG capsule Take 1-2 capsules (100-200 mg total) by mouth 3 (three) times daily as needed for cough. 03/25/19   [place], Holli Humbles, NP  prochlorperazine (COMPAZINE) 10 MG tablet Take 1 tablet (10 mg total) by mouth every 6  (six) hours as needed for nausea or vomiting. 09/03/18 10/29/18  Volanda Napoleon, MD    Family History No family history on file.  Social History Social History   Tobacco Use  . Smoking status: Light Tobacco Smoker    Packs/day: 1.00    Types: Cigarettes  . Smokeless tobacco: Never Used  Substance Use Topics  . Alcohol use: Yes    Alcohol/week: 0.0 standard drinks    Comment: beer a day  . Drug use: Yes    Types: Marijuana    Comment: 1-2x wk     Allergies   Patient has no known allergies.   Review of Systems Review of Systems  Constitutional: Positive for fever. Negative for chills.  HENT: Negative for congestion and facial swelling.   Eyes: Negative for discharge and visual disturbance.  Respiratory: Positive for shortness of breath.   Cardiovascular: Positive for chest pain. Negative  for palpitations.  Gastrointestinal: Positive for nausea and vomiting. Negative for abdominal pain and diarrhea.  Musculoskeletal: Negative for arthralgias and myalgias.  Skin: Negative for color change and rash.  Neurological: Negative for tremors, syncope and headaches.  Psychiatric/Behavioral: Negative for confusion and dysphoric mood.     Physical Exam Updated Vital Signs BP 98/61   Pulse (!) 102   Temp 98.9 F (37.2 C) (Oral)   Resp (!) 21   Ht 5\' 9"  (1.753 m)   Wt 58.5 kg   SpO2 97%   BMI 19.05 kg/m   Physical Exam Vitals signs and nursing note reviewed.  Constitutional:      Appearance: He is well-developed.  HENT:     Head: Normocephalic and atraumatic.  Eyes:     Pupils: Pupils are equal, round, and reactive to light.  Neck:     Musculoskeletal: Normal range of motion and neck supple.     Vascular: No JVD.  Cardiovascular:     Rate and Rhythm: Normal rate and regular rhythm.     Heart sounds: No murmur. No friction rub. No gallop.   Pulmonary:     Effort: No respiratory distress.     Breath sounds: No wheezing.  Abdominal:     General: There is no  distension.     Palpations: Abdomen is soft.     Tenderness: There is no abdominal tenderness. There is no guarding or rebound.     Comments: Benign abdominal exam  Musculoskeletal: Normal range of motion.  Skin:    Coloration: Skin is not pale.     Findings: No rash.  Neurological:     Mental Status: He is alert and oriented to person, place, and time.  Psychiatric:        Behavior: Behavior normal.      ED Treatments / Results  Labs (all labs ordered are listed, but only abnormal results are displayed) Labs Reviewed  COMPREHENSIVE METABOLIC PANEL - Abnormal; Notable for the following components:      Result Value   Sodium 132 (*)    Glucose, Bld 148 (*)    Calcium 8.2 (*)    Albumin 2.2 (*)    All other components within normal limits  URINALYSIS, ROUTINE W REFLEX MICROSCOPIC - Abnormal; Notable for the following components:   Color, Urine AMBER (*)    All other components within normal limits  CBC WITH DIFFERENTIAL/PLATELET - Abnormal; Notable for the following components:   WBC 11.1 (*)    RBC 3.76 (*)    Hemoglobin 11.4 (*)    HCT 35.3 (*)    Neutro Abs 8.5 (*)    Monocytes Absolute 1.3 (*)    Abs Immature Granulocytes 0.11 (*)    All other components within normal limits  CULTURE, BLOOD (ROUTINE X 2)  CULTURE, BLOOD (ROUTINE X 2)  URINE CULTURE  TROPONIN I  LIPASE, BLOOD  CBC WITH DIFFERENTIAL/PLATELET    EKG EKG Interpretation  Date/Time:  Wednesday April 08 2019 17:27:24 EDT Ventricular Rate:  98 PR Interval:    QRS Duration: 73 QT Interval:  334 QTC Calculation: 427 R Axis:   69 Text Interpretation:  Sinus tachycardia Multiple premature complexes, vent & supraven Probable left atrial enlargement Probable anteroseptal infarct, old Minimal ST elevation, inferior leads Otherwise no significant change Confirmed by Deno Etienne [phone]) on 04/08/2019 6:49:22 PM   Radiology Dg Chest Port 1 View  Result Date: 04/08/2019 CLINICAL DATA:  Worsening shortness of  breath and fever. History of lung  cancer on chemotherapy. EXAM: PORTABLE CHEST 1 VIEW COMPARISON:  PET-CT 11/21/2018. Chest radiograph 08/12/2018. FINDINGS: A right jugular Port-A-Cath remains in place terminating over the lower SVC. Aortic atherosclerosis is noted the cardiomediastinal silhouette is unchanged from the prior radiograph. Underlying emphysema is again seen as well as evidence of some right lung volume loss. There is a small to moderate right pleural effusion which has enlarged from the prior radiograph and is new from the interval CT. Increasing airspace and interstitial opacity are present throughout the right lung base and right mid lung. Known calcified pleural plaques are most conspicuous in the left mid hemithorax. No acute osseous abnormality is seen. IMPRESSION: Enlarging, small to moderate-sized right pleural effusion with worsening right lung airspace and interstitial opacities concerning for pneumonia. Asymmetric edema and lymphangitic tumor are also possible. Electronically Signed   By: Logan Bores M.D.   On: 04/08/2019 18:43    Procedures Procedures (including critical care time)  Medications Ordered in ED Medications  sodium chloride 0.9 % bolus 1,000 mL (0 mLs Intravenous Stopped 04/08/19 2019)  ondansetron (ZOFRAN) injection 4 mg (4 mg Intravenous Given 04/08/19 1800)  alum & mag hydroxide-simeth (MAALOX/MYLANTA) 200-200-20 MG/5ML suspension 30 mL (30 mLs Oral Given 04/08/19 1800)    And  lidocaine (XYLOCAINE) 2 % viscous mouth solution 15 mL (15 mLs Oral Given 04/08/19 1800)  vancomycin (VANCOCIN) 1,250 mg in sodium chloride 0.9 % 250 mL IVPB (1,250 mg Intravenous New Bag/Given 04/08/19 2013)  piperacillin-tazobactam (ZOSYN) IVPB 3.375 g (0 g Intravenous Stopped 04/08/19 2044)  chlorproMAZINE (THORAZINE) tablet 25 mg (25 mg Oral Given 04/08/19 2030)     Initial Impression / Assessment and Plan / ED Course  I have reviewed the triage vital signs and the nursing notes.   Pertinent labs & imaging results that were available during my care of the patient were reviewed by me and considered in my medical decision making (see chart for details).        73 yo M with a cc of fever.  102 yesterday.  No antipyretics.  Chronic cough unchanged.  The patient is normally on 2 L of oxygen as needed and has been wearing it continually for the past couple days.  88% on room air initially.  N/v over the past week or so. Denies wounds, denies dysuria.    Has episodes where he has to clear his throat.  Tolerating secretions without difficulty.  Will obtain labs, cxr, ua.  Afebrile here.   CXR with large R sided opacity, read as possible pna.  Patient reassessed and feels very short of breath still.  In no distress.  Given HCAP abx.  Awaiting cbc.   CBC finally returned, mild leukocytosis anemia appears to be at baseline.  The patients results and plan were reviewed and discussed.   Any x-rays performed were independently reviewed by myself.   Differential diagnosis were considered with the presenting HPI.  Medications  sodium chloride 0.9 % bolus 1,000 mL (0 mLs Intravenous Stopped 04/08/19 2019)  ondansetron (ZOFRAN) injection 4 mg (4 mg Intravenous Given 04/08/19 1800)  alum & mag hydroxide-simeth (MAALOX/MYLANTA) 200-200-20 MG/5ML suspension 30 mL (30 mLs Oral Given 04/08/19 1800)    And  lidocaine (XYLOCAINE) 2 % viscous mouth solution 15 mL (15 mLs Oral Given 04/08/19 1800)  vancomycin (VANCOCIN) 1,250 mg in sodium chloride 0.9 % 250 mL IVPB (1,250 mg Intravenous New Bag/Given 04/08/19 2013)  piperacillin-tazobactam (ZOSYN) IVPB 3.375 g (0 g Intravenous Stopped 04/08/19 2044)  chlorproMAZINE (THORAZINE)  tablet 25 mg (25 mg Oral Given 04/08/19 2030)    Vitals:   04/08/19 1900 04/08/19 1930 04/08/19 1946 04/08/19 2000  BP: 107/68 108/71 108/71 98/61  Pulse:  (!) 101 (!) 103 (!) 102  Resp:  14 16 (!) 21  Temp:      TempSrc:      SpO2:  96% 96% 97%  Weight:      Height:         Final diagnoses:  HCAP (healthcare-associated pneumonia)    Admission/ observation were discussed with the admitting physician, patient and/or family and they are comfortable with the plan.    Final Clinical Impressions(s) / ED Diagnoses   Final diagnoses:  HCAP (healthcare-associated pneumonia)    ED Discharge Orders    None       Deno Etienne, DO 04/08/19 2149

## 2019-04-08 NOTE — ED Notes (Signed)
Bed: QI29 Expected date:  Expected time:  Means of arrival:  Comments: EMS-lung cancer/fever

## 2019-04-08 NOTE — H&P (Signed)
History and Physical   Ryan Houston:[id number] DOB: [birth year] DOA: 04/08/2019  Referring MD/NP/PA: Dr. Tyrone Nine  PCP: Rogers Blocker, MD   Outpatient Specialists: Dr. Marin Olp, oncology  Patient coming from: Home  Chief Complaint: Shortness of breath  HPI: Ryan Houston is a 73 y.o. male with medical history significant of malignant neoplasm of the right bronchus, GERD, hepatitis C, hypertension, iron deficiency anemia who presents with progressive shortness of breath and cough for the last 3 days.Patient reports recurrent symptoms on and off that has gotten worse now.  He also had a fever yesterday up to 102.  He has been feeling nauseated and had one episode of vomiting.  Patient is on immunotherapy but could not go due to generalized weakness and shortness of breath so he came to the ER to be evaluated.  He is on 2 L of oxygen at home but now requiring 4 L here.  He denied any abdominal pain.  Denied any contact with somebody having Covid 19.  In the ER patient's oxygen sat was 88% on his baseline.  Currently 96% on 2 L.  He has been in and out of chemo and immunotherapy hence is considered imminent compromise.  Low risk for COVID-19 infection he is being admitted with pneumonia based on chest x-ray findings..  ED Course: Temperature is 98.9 blood pressure 91/59 pulse 114 respiratory 26 oxygen sat 88% on 2 L.  Sodium 132 potassium 4.0 chloride 98 CO2 of 28 glucose 148.  Albumin is 2.2.  White count 11.1 hemoglobin 11.4 and platelets 269.  Relatively normal differentials. Chest x-ray showed enlarging small-to-moderate sized right pleural effusion with worsening right lung airspace opacities concerning for pneumonia.  Patient initiated on antibiotics and being admitted for work-up.  Review of Systems: As per HPI otherwise 10 point review of systems negative.    Past Medical History:  Diagnosis Date  . Arthritis   . Cancer ([place])    lung  . Cough with hemoptysis 08/06/2018  . GERD  (gastroesophageal reflux disease)   . Goals of care, counseling/discussion 08/22/2018  . Hepatitis C   . Hypercalcemia of malignancy 08/06/2018  . Hypertension   . Iron deficiency anemia due to chronic blood loss 03/25/2019  . Mediastinal mass 08/06/2018    Past Surgical History:  Procedure Laterality Date  . BACK SURGERY    . BRONCHIAL WASHINGS  08/13/2018   Procedure: BRONCHIAL WASHINGS;  Surgeon: Garner Nash, DO;  Location: [facility];  Service: Cardiopulmonary;;  . ENDOBRONCHIAL ULTRASOUND Bilateral 08/13/2018   Procedure: ENDOBRONCHIAL ULTRASOUND;  Surgeon: Garner Nash, DO;  Location: [facility];  Service: Cardiopulmonary;  Laterality: Bilateral;  . FINE NEEDLE ASPIRATION  08/13/2018   Procedure: FINE NEEDLE ASPIRATION (FNA) LINEAR;  Surgeon: Garner Nash, DO;  Location: [facility];  Service: Cardiopulmonary;;  . IR IMAGING GUIDED PORT INSERTION  08/08/2018  . VIDEO BRONCHOSCOPY Bilateral 08/07/2018   Procedure: VIDEO BRONCHOSCOPY WITH FLUORO;  Surgeon: Chesley Mires, MD;  Location: [facility];  Service: Endoscopy;  Laterality: Bilateral;  . VIDEO BRONCHOSCOPY  08/13/2018   Procedure: VIDEO BRONCHOSCOPY;  Surgeon: Garner Nash, DO;  Location: [facility];  Service: Cardiopulmonary;;     reports that he has been smoking cigarettes. He has been smoking about 1.00 pack per day. He has never used smokeless tobacco. He reports current alcohol use. He reports current drug use. Drug: Marijuana.  No Known Allergies  No family history on file.   Prior to Admission medications  Medication Sig Start Date End Date Taking? Authorizing Provider  Cholecalciferol (VITAMIN D-3) 1000 UNITS CAPS Take 2,000 Units by mouth daily.    Yes [provider]  gabapentin (NEURONTIN) 300 MG capsule Take 1 capsule (300 mg total) by mouth 3 (three) times daily. 10/29/18  Yes Volanda Napoleon, MD  hydrOXYzine (ATARAX/VISTARIL) 25 MG tablet Take 25 mg by mouth every 8 (eight) hours as  needed for anxiety.  02/08/19  Yes [provider]  ipratropium-albuterol (DUONEB) 0.5-2.5 (3) MG/3ML SOLN USE 3 ML VIA NEBULIZER THREE TIMES DAILY Patient taking differently: Take 3 mLs by nebulization every 2 (two) hours as needed (wheezing/sob).  03/25/19  Yes [place], Holli Humbles, NP  lidocaine-prilocaine (EMLA) cream Apply to affected area once 01/08/19  Yes Ennever, Rudell Cobb, MD  mirabegron ER (MYRBETRIQ) 25 MG TB24 tablet Take 1 tablet (25 mg total) by mouth daily. 10/29/18  Yes Volanda Napoleon, MD  montelukast (SINGULAIR) 10 MG tablet Take 10 mg by mouth. 12/01/18  Yes [provider]  omeprazole (PRILOSEC) 20 MG capsule Take 1 capsule (20 mg total) by mouth daily. 10/30/18  Yes Volanda Napoleon, MD  oxyCODONE ER [facility] ER) 18 MG C12A Take 18 mg by mouth every 12 (twelve) hours. 03/25/19  Yes [place], Holli Humbles, NP  Oxycodone HCl 10 MG TABS Take 1 tablet (10 mg total) by mouth every 6 (six) hours as needed. Patient taking differently: Take 10 mg by mouth every 6 (six) hours as needed (pain).  03/31/19  Yes Ennever, Rudell Cobb, MD  senna-docusate (SENOKOT-S) 8.6-50 MG tablet Take 1 tablet by mouth 2 (two) times daily. 10/29/18  Yes Ennever, Rudell Cobb, MD  vitamin B-12 (CYANOCOBALAMIN) 1000 MCG tablet Take 1 tablet (1,000 mcg total) by mouth every other day. 10/29/18  Yes Ennever, Rudell Cobb, MD  benzonatate (TESSALON PERLES) 100 MG capsule Take 1-2 capsules (100-200 mg total) by mouth 3 (three) times daily as needed for cough. 03/25/19   [place], Holli Humbles, NP  prochlorperazine (COMPAZINE) 10 MG tablet Take 1 tablet (10 mg total) by mouth every 6 (six) hours as needed for nausea or vomiting. 09/03/18 10/29/18  Volanda Napoleon, MD    Physical Exam: Vitals:   04/08/19 2030 04/08/19 2100 04/08/19 2130 04/08/19 2200  BP: 109/67 96/64 (!) 91/59 105/71  Pulse: 100 (!) 111 (!) 113 (!) 114  Resp: 16 (!) 26 13 19   Temp:      TempSrc:      SpO2: 97% 100% 92% 100%  Weight:      Height:           Constitutional: NAD, cachectic, chronically ill looking Vitals:   04/08/19 2030 04/08/19 2100 04/08/19 2130 04/08/19 2200  BP: 109/67 96/64 (!) 91/59 105/71  Pulse: 100 (!) 111 (!) 113 (!) 114  Resp: 16 (!) 26 13 19   Temp:      TempSrc:      SpO2: 97% 100% 92% 100%  Weight:      Height:       Eyes: PERRL, lids and conjunctivae normal ENMT: Mucous membranes are moist. Posterior pharynx clear of any exudate or lesions.Normal dentition.  Neck: normal, supple, no masses, no thyromegaly Respiratory: Decreased air entry especially right lung with crackles, no wheezing,.  Increased respiratory effort. No accessory muscle use.  Cardiovascular: Regular rate and rhythm, no murmurs / rubs / gallops. No extremity edema. 2+ pedal pulses. No carotid bruits.  Abdomen: no tenderness, no masses palpated. No hepatosplenomegaly. Bowel sounds positive.  Musculoskeletal: no clubbing / cyanosis. No joint deformity upper and lower extremities. Good ROM, no contractures. Normal muscle tone.  Skin: no rashes, lesions, ulcers. No induration Neurologic: CN 2-12 grossly intact. Sensation intact, DTR normal. Strength 5/5 in all 4.  Psychiatric: Normal judgment and insight. Alert and oriented x 3. Normal mood.     Labs on Admission: I have personally reviewed following labs and imaging studies  CBC: Recent Labs  Lab 04/08/19 1912  WBC 11.1*  NEUTROABS 8.5*  HGB 11.4*  HCT 35.3*  MCV 93.9  PLT 921   Basic Metabolic Panel: Recent Labs  Lab 04/08/19 1812  NA 132*  K 4.0  CL 98  CO2 28  GLUCOSE 148*  BUN 12  CREATININE 0.85  CALCIUM 8.2*   GFR: Estimated Creatinine Clearance: 64 mL/min (by C-G formula based on SCr of 0.85 mg/dL). Liver Function Tests: Recent Labs  Lab 04/08/19 1812  AST 28  ALT 15  ALKPHOS 70  BILITOT 0.4  PROT 7.7  ALBUMIN 2.2*   Recent Labs  Lab 04/08/19 1812  LIPASE 33   No results for input(s): AMMONIA in the last 168 hours. Coagulation Profile: No  results for input(s): INR, PROTIME in the last 168 hours. Cardiac Enzymes: Recent Labs  Lab 04/08/19 1812  TROPONINI <0.03   BNP (last 3 results) No results for input(s): PROBNP in the last 8760 hours. HbA1C: No results for input(s): HGBA1C in the last 72 hours. CBG: No results for input(s): GLUCAP in the last 168 hours. Lipid Profile: No results for input(s): CHOL, HDL, LDLCALC, TRIG, CHOLHDL, LDLDIRECT in the last 72 hours. Thyroid Function Tests: No results for input(s): TSH, T4TOTAL, FREET4, T3FREE, THYROIDAB in the last 72 hours. Anemia Panel: No results for input(s): VITAMINB12, FOLATE, FERRITIN, TIBC, IRON, RETICCTPCT in the last 72 hours. Urine analysis:    Component Value Date/Time   COLORURINE AMBER (A) 04/08/2019 1706   APPEARANCEUR CLEAR 04/08/2019 1706   LABSPEC 1.013 04/08/2019 1706   PHURINE 6.0 04/08/2019 1706   GLUCOSEU NEGATIVE 04/08/2019 1706   HGBUR NEGATIVE 04/08/2019 1706   BILIRUBINUR NEGATIVE 04/08/2019 1706   KETONESUR NEGATIVE 04/08/2019 1706   PROTEINUR NEGATIVE 04/08/2019 1706   NITRITE NEGATIVE 04/08/2019 1706   LEUKOCYTESUR NEGATIVE 04/08/2019 1706   Sepsis Labs: @LABRCNTIP (procalcitonin:4,lacticidven:4) )No results found for this or any previous visit (from the past 240 hour(s)).   Radiological Exams on Admission: Dg Chest Port 1 View  Result Date: 04/08/2019 CLINICAL DATA:  Worsening shortness of breath and fever. History of lung cancer on chemotherapy. EXAM: PORTABLE CHEST 1 VIEW COMPARISON:  PET-CT 11/21/2018. Chest radiograph 08/12/2018. FINDINGS: A right jugular Port-A-Cath remains in place terminating over the lower SVC. Aortic atherosclerosis is noted the cardiomediastinal silhouette is unchanged from the prior radiograph. Underlying emphysema is again seen as well as evidence of some right lung volume loss. There is a small to moderate right pleural effusion which has enlarged from the prior radiograph and is new from the interval CT.  Increasing airspace and interstitial opacity are present throughout the right lung base and right mid lung. Known calcified pleural plaques are most conspicuous in the left mid hemithorax. No acute osseous abnormality is seen. IMPRESSION: Enlarging, small to moderate-sized right pleural effusion with worsening right lung airspace and interstitial opacities concerning for pneumonia. Asymmetric edema and lymphangitic tumor are also possible. Electronically Signed   By: Logan Bores M.D.   On: 04/08/2019 18:43    Assessment/Plan Principal Problem:   Pneumonia Active Problems:  Chronic hepatitis C without hepatic coma (HCC)   GERD   Malnutrition of moderate degree   Primary malignant neoplasm of bronchus of right lower lobe (HCC)     #1 right lung pneumonia with pleural effusion: Most likely postobstructive pneumonia with malignant pleural effusion.  Patient will be admitted initiated on antibiotics.  Blood cultures have been sent and sputum cultures to be obtained.  Patient is a low risk for COVID-19 but will be placed on droplet isolation with rule out.  #2 lung cancer: Being followed by oncology.  Patient should continue per oncology with his immunotherapy when stable.  #3 chronic hepatitis C: Stable at baseline.  #4 GERD: Continue with PPIs.  #5 moderate malnutrition: Encourage intake.  Patient looks cachectic.  Nutrition consult probably.   DVT prophylaxis: Lovenox Code Status: Full code Family Communication: Discussed care with patient no family at bedside Disposition Plan: To be determined Consults called: None at the moment.  Consult oncology if needed Admission status: Inpatient  Severity of Illness: The appropriate patient status for this patient is INPATIENT. Inpatient status is judged to be reasonable and necessary in order to provide the required intensity of service to ensure the patient's safety. The patient's presenting symptoms, physical exam findings, and initial  radiographic and laboratory data in the context of their chronic comorbidities is felt to place them at high risk for further clinical deterioration. Furthermore, it is not anticipated that the patient will be medically stable for discharge from the hospital within 2 midnights of admission. The following factors support the patient status of inpatient.   " The patient's presenting symptoms include shortness of breath cough and fever. " The worrisome physical exam findings include bilateral crackles. " The initial radiographic and laboratory data are worrisome because of evidence of pneumonia with pleural effusion. " The chronic co-morbidities include lung cancer.   * I certify that at the point of admission it is my clinical judgment that the patient will require inpatient hospital care spanning beyond 2 midnights from the point of admission due to high intensity of service, high risk for further deterioration and high frequency of surveillance required.Barbette Merino MD [facility] Pager [phone]  If 7PM-7AM, please contact night-coverage [email or web address] Password [facility]  04/08/2019, 11:39 PM

## 2019-04-08 NOTE — ED Notes (Signed)
Pt aware that a urine sample is needed, pt unable at this time.

## 2019-04-08 NOTE — Telephone Encounter (Signed)
On screening call yesterday, pt stated that he was having SOB, with nausea, vomiting, diarrhea, that he was going to the ED and would not be in for scheduled appt today.  Pt did not go to the ED yesterday.  Call placed to patient and patient stated that he did not go to the ED because he did not have a ride there.  Pt states that his SOB, N/V continue.  Pt instructed to call an ambulance now to get to the ED.  Pt states that he will call an ambulance now.

## 2019-04-08 NOTE — ED Triage Notes (Signed)
Pt has a hx of lung CA, currently on chemo. Last tx was on 04/01/19, pt was supposed to go for chemo today, but did not feel well enough. Pt states he has has increasing cough, SHOB, generalized body aches for "weeks", and presented today with a temperature of 102 per EMS.

## 2019-04-08 NOTE — Progress Notes (Signed)
Pharmacy Antibiotic Note  Ryan Houston is a 73 y.o. male admitted on 04/08/2019 with pneumonia.  Pharmacy has been consulted for vancomycin dosing. He was given Zosyn 3.375 gm and vancomycin 1250 mg in the ED.  Pt w/ lung cancer on chemo.   Plan: Cefepime 1 gm IV q8h per MD Vancomycin 1250 mg IV loading dose Vancomycin 1000 mg IV Q 24 hrs. Goal AUC 400-550. Expected AUC: 478.8 Css max 34.1 Css min 10.9 SCr used: 1 F/u renal function, WBC, temp, culture data Vancomycin levels as needed  Height: 5\' 9"  (175.3 cm) Weight: 128 lb 15.5 oz (58.5 kg) IBW/kg (Calculated) : 70.7  Temp (24hrs), Avg:98.9 F (37.2 C), Min:98.9 F (37.2 C), Max:98.9 F (37.2 C)  Recent Labs  Lab 04/08/19 1812 04/08/19 1912  WBC  --  11.1*  CREATININE 0.85  --     Estimated Creatinine Clearance: 64 mL/min (by C-G formula based on SCr of 0.85 mg/dL).    No Known Allergies  Antimicrobials this admission: 4/8 Vanc>> 4/8 Zosyn x 1 4/9 Cefepime>> Dose adjustments this admission:  Microbiology results: 4/8 BCx2>>sent 4/8 Ucx>> sent 4/8 Novel coronavirus NAA>> ordered 4/8 strep pneumo Uag> ordered 4/8 HIV> ordered 4/8 sputum> ordered  Eudelia Bunch, Pharm.D [phone] 04/08/2019 10:35 PM

## 2019-04-09 ENCOUNTER — Encounter (HOSPITAL_COMMUNITY): Payer: Self-pay | Admitting: Radiology

## 2019-04-09 ENCOUNTER — Inpatient Hospital Stay (HOSPITAL_COMMUNITY): Payer: Medicare Other

## 2019-04-09 DIAGNOSIS — F17210 Nicotine dependence, cigarettes, uncomplicated: Secondary | ICD-10-CM

## 2019-04-09 DIAGNOSIS — C3491 Malignant neoplasm of unspecified part of right bronchus or lung: Secondary | ICD-10-CM

## 2019-04-09 LAB — COMPREHENSIVE METABOLIC PANEL
ALT: 15 U/L (ref 0–44)
AST: 25 U/L (ref 15–41)
Albumin: 1.9 g/dL — ABNORMAL LOW (ref 3.5–5.0)
Alkaline Phosphatase: 59 U/L (ref 38–126)
Anion gap: 6 (ref 5–15)
BUN: 11 mg/dL (ref 8–23)
CO2: 25 mmol/L (ref 22–32)
Calcium: 7.7 mg/dL — ABNORMAL LOW (ref 8.9–10.3)
Chloride: 98 mmol/L (ref 98–111)
Creatinine, Ser: 0.85 mg/dL (ref 0.61–1.24)
GFR calc Af Amer: >60 mL/min (ref >60)
GFR calc non Af Amer: >60 mL/min (ref >60)
Glucose, Bld: 141 mg/dL — ABNORMAL HIGH (ref 70–99)
Potassium: 3.5 mmol/L (ref 3.5–5.1)
Sodium: 129 mmol/L — ABNORMAL LOW (ref 135–145)
Total Bilirubin: 0.5 mg/dL (ref 0.3–1.2)
Total Protein: 6.9 g/dL (ref 6.5–8.1)

## 2019-04-09 LAB — MRSA PCR SCREENING: MRSA by PCR: NEGATIVE

## 2019-04-09 LAB — CBC WITH DIFFERENTIAL/PLATELET
Abs Immature Granulocytes: 0.09 10*3/uL — ABNORMAL HIGH (ref 0.00–0.07)
Basophils Absolute: 0.1 10*3/uL (ref 0.0–0.1)
Basophils Relative: 0 %
Eosinophils Absolute: 0.1 10*3/uL (ref 0.0–0.5)
Eosinophils Relative: 1 %
HCT: 34.8 % — ABNORMAL LOW (ref 39.0–52.0)
Hemoglobin: 11.2 g/dL — ABNORMAL LOW (ref 13.0–17.0)
Immature Granulocytes: 1 %
Lymphocytes Relative: 6 %
Lymphs Abs: 0.7 10*3/uL (ref 0.7–4.0)
MCH: 30.4 pg (ref 26.0–34.0)
MCHC: 32.2 g/dL (ref 30.0–36.0)
MCV: 94.6 fL (ref 80.0–100.0)
Monocytes Absolute: 1.3 10*3/uL — ABNORMAL HIGH (ref 0.1–1.0)
Monocytes Relative: 11 %
Neutro Abs: 9.9 10*3/uL — ABNORMAL HIGH (ref 1.7–7.7)
Neutrophils Relative %: 81 %
Platelets: 269 10*3/uL (ref 150–400)
RBC: 3.68 MIL/uL — ABNORMAL LOW (ref 4.22–5.81)
RDW: 12.3 % (ref 11.5–15.5)
WBC: 12.2 10*3/uL — ABNORMAL HIGH (ref 4.0–10.5)
nRBC: 0 % (ref 0.0–0.2)

## 2019-04-09 LAB — URINE CULTURE

## 2019-04-09 LAB — CREATININE, SERUM
Creatinine, Ser: 0.93 mg/dL (ref 0.61–1.24)
GFR calc Af Amer: >60 mL/min (ref >60)
GFR calc non Af Amer: >60 mL/min (ref >60)

## 2019-04-09 LAB — HIV ANTIBODY (ROUTINE TESTING W REFLEX): HIV Screen 4th Generation wRfx: NONREACTIVE

## 2019-04-09 LAB — STREP PNEUMONIAE URINARY ANTIGEN: Strep Pneumo Urinary Antigen: NEGATIVE

## 2019-04-09 MED ORDER — MONTELUKAST SODIUM 10 MG PO TABS
10.0000 mg | ORAL_TABLET | Freq: Every day | ORAL | Status: DC
  Administered 2019-04-09 – 2019-04-11 (×4): 10 mg via ORAL
  Filled 2019-04-09 (×4): qty 1

## 2019-04-09 MED ORDER — ENOXAPARIN SODIUM 40 MG/0.4ML ~~LOC~~ SOLN
40.0000 mg | SUBCUTANEOUS | Status: DC
  Administered 2019-04-09 – 2019-04-12 (×4): 40 mg via SUBCUTANEOUS
  Filled 2019-04-09 (×4): qty 0.4

## 2019-04-09 MED ORDER — HYDROXYZINE HCL 25 MG PO TABS
25.0000 mg | ORAL_TABLET | Freq: Three times a day (TID) | ORAL | Status: DC | PRN
  Administered 2019-04-10 – 2019-04-12 (×5): 25 mg via ORAL
  Filled 2019-04-09 (×5): qty 1

## 2019-04-09 MED ORDER — VITAMIN D3 25 MCG (1000 UNIT) PO TABS
2000.0000 [IU] | ORAL_TABLET | Freq: Every day | ORAL | Status: DC
  Administered 2019-04-09: 2000 [IU] via ORAL
  Filled 2019-04-09: qty 2

## 2019-04-09 MED ORDER — MIRABEGRON ER 25 MG PO TB24
25.0000 mg | ORAL_TABLET | Freq: Every day | ORAL | Status: DC
  Administered 2019-04-09 – 2019-04-12 (×4): 25 mg via ORAL
  Filled 2019-04-09 (×4): qty 1

## 2019-04-09 MED ORDER — OXYCODONE HCL 5 MG PO TABS
10.0000 mg | ORAL_TABLET | Freq: Four times a day (QID) | ORAL | Status: DC | PRN
  Administered 2019-04-10 – 2019-04-11 (×3): 10 mg via ORAL
  Filled 2019-04-09 (×3): qty 2

## 2019-04-09 MED ORDER — OXYCODONE HCL ER 20 MG PO T12A
20.0000 mg | EXTENDED_RELEASE_TABLET | Freq: Two times a day (BID) | ORAL | Status: DC
  Administered 2019-04-09 – 2019-04-12 (×7): 20 mg via ORAL
  Filled 2019-04-09 (×7): qty 1

## 2019-04-09 MED ORDER — LIDOCAINE-PRILOCAINE 2.5-2.5 % EX CREA: TOPICAL_CREAM | Freq: Every day | CUTANEOUS | Status: DC

## 2019-04-09 MED ORDER — PANTOPRAZOLE SODIUM 40 MG PO TBEC
40.0000 mg | DELAYED_RELEASE_TABLET | Freq: Every day | ORAL | Status: DC
  Administered 2019-04-09 – 2019-04-12 (×4): 40 mg via ORAL
  Filled 2019-04-09 (×4): qty 1

## 2019-04-09 MED ORDER — SODIUM CHLORIDE (PF) 0.9 % IJ SOLN
INTRAMUSCULAR | Status: AC
  Filled 2019-04-09: qty 50

## 2019-04-09 MED ORDER — BENZONATATE 100 MG PO CAPS: 100.0000 mg | ORAL_CAPSULE | Freq: Three times a day (TID) | ORAL | Status: DC | PRN

## 2019-04-09 MED ORDER — SODIUM CHLORIDE 0.9 % IV SOLN
INTRAVENOUS | Status: DC
  Administered 2019-04-09: 02:00:00 via INTRAVENOUS

## 2019-04-09 MED ORDER — IOHEXOL 300 MG/ML  SOLN
75.0000 mL | Freq: Once | INTRAMUSCULAR | Status: AC | PRN
  Administered 2019-04-09: 75 mL via INTRAVENOUS

## 2019-04-09 MED ORDER — VITAMIN B-12 1000 MCG PO TABS
1000.0000 ug | ORAL_TABLET | ORAL | Status: DC
  Administered 2019-04-09: 1000 ug via ORAL
  Filled 2019-04-09: qty 1

## 2019-04-09 MED ORDER — SODIUM CHLORIDE 0.9 % IV SOLN
1.0000 g | Freq: Three times a day (TID) | INTRAVENOUS | Status: DC
  Administered 2019-04-09 – 2019-04-10 (×4): 1 g via INTRAVENOUS
  Filled 2019-04-09 (×6): qty 1

## 2019-04-09 MED ORDER — GABAPENTIN 300 MG PO CAPS
300.0000 mg | ORAL_CAPSULE | Freq: Three times a day (TID) | ORAL | Status: DC
  Administered 2019-04-09 (×3): 300 mg via ORAL
  Filled 2019-04-09 (×3): qty 1

## 2019-04-09 MED ORDER — OXYCODONE ER 18 MG PO C12A: 18.0000 mg | EXTENDED_RELEASE_CAPSULE | Freq: Two times a day (BID) | ORAL | Status: DC

## 2019-04-09 NOTE — ED Notes (Signed)
ED TO INPATIENT HANDOFF REPORT  Name/Age/Gender Ryan Houston 73 y.o. male  Code Status Code Status History    Date Active Date Inactive Code Status Order ID Comments User Context   08/07/2018 0331 08/14/2018 1913 Full Code [id number]  Etta Quill, DO Inpatient    Advance Directive Documentation     Most Recent Value  Type of Advance Directive  Living will  Pre-existing out of facility DNR order (yellow form or pink MOST form)  -  "MOST" Form in Place?  -      Home/SNF/Other Home  Chief Complaint SOB/Fever/Lung recipient  Level of Care/Admitting Diagnosis ED Disposition    ED Disposition Condition [place]: [place] [[id number]]  Level of Care: Med-Surg [16]  Diagnosis: Pneumonia [227785]  Admitting Physician: Elwyn Reach [[id number]]  Attending Physician: Elwyn Reach [[id number]]  Estimated length of stay: past midnight tomorrow  Certification:: I certify this patient will need inpatient services for at least 2 midnights  Bed request comments: low risk Covid  PT Class (Do Not Modify): Inpatient [101]  PT Acc Code (Do Not Modify): Private [1]       Medical History Past Medical History:  Diagnosis Date  . Arthritis   . Cancer ([place])    lung  . Cough with hemoptysis 08/06/2018  . GERD (gastroesophageal reflux disease)   . Goals of care, counseling/discussion 08/22/2018  . Hepatitis C   . Hypercalcemia of malignancy 08/06/2018  . Hypertension   . Iron deficiency anemia due to chronic blood loss 03/25/2019  . Mediastinal mass 08/06/2018    Allergies No Known Allergies  IV Location/Drains/Wounds Patient Lines/Drains/Airways Status   Active Line/Drains/Airways    Name:   Placement date:   Placement time:   Site:   Days:   Implanted Port 08/08/18 Right Chest   08/08/18    1611    Chest   244          Labs/Imaging Results for orders placed or performed during the hospital encounter of 04/08/19 (from the past 48  hour(s))  Urinalysis, Routine w reflex microscopic (not at [facility])     Status: Abnormal   Collection Time: 04/08/19  5:06 PM  Result Value Ref Range   Color, Urine AMBER (A) YELLOW    Comment: BIOCHEMICALS MAY BE AFFECTED BY COLOR   APPearance CLEAR CLEAR   Specific Gravity, Urine 1.013 1.005 - 1.030   pH 6.0 5.0 - 8.0   Glucose, UA NEGATIVE NEGATIVE mg/dL   Hgb urine dipstick NEGATIVE NEGATIVE   Bilirubin Urine NEGATIVE NEGATIVE   Ketones, ur NEGATIVE NEGATIVE mg/dL   Protein, ur NEGATIVE NEGATIVE mg/dL   Nitrite NEGATIVE NEGATIVE   Leukocytes,Ua NEGATIVE NEGATIVE    Comment: Performed at [facility], [place] [address], [place], [place] [address]  Comprehensive metabolic panel     Status: Abnormal   Collection Time: 04/08/19  6:12 PM  Result Value Ref Range   Sodium 132 (L) 135 - 145 mmol/L   Potassium 4.0 3.5 - 5.1 mmol/L   Chloride 98 98 - 111 mmol/L   CO2 28 22 - 32 mmol/L   Glucose, Bld 148 (H) 70 - 99 mg/dL   BUN 12 8 - 23 mg/dL   Creatinine, Ser 0.85 0.61 - 1.24 mg/dL   Calcium 8.2 (L) 8.9 - 10.3 mg/dL   Total Protein 7.7 6.5 - 8.1 g/dL   Albumin 2.2 (L) 3.5 - 5.0 g/dL   AST  28 15 - 41 U/L   ALT 15 0 - 44 U/L   Alkaline Phosphatase 70 38 - 126 U/L   Total Bilirubin 0.4 0.3 - 1.2 mg/dL   GFR calc non Af Amer >60 >60 mL/min   GFR calc Af Amer >60 >60 mL/min   Anion gap 6 5 - 15    Comment: Performed at [facility], [place] [address], [place], [place] [address]  Troponin I - ONCE - STAT     Status: None   Collection Time: 04/08/19  6:12 PM  Result Value Ref Range   Troponin I <0.03 <0.03 ng/mL    Comment: Performed at [facility], [place] [address], [place], [place] [address]  Lipase, blood     Status: None   Collection Time: 04/08/19  6:12 PM  Result Value Ref Range   Lipase 33 11 - 51 U/L    Comment: Performed at [facility], [place] [address] [place], [place] [address]  CBC with  Differential/Platelet     Status: Abnormal   Collection Time: 04/08/19  7:12 PM  Result Value Ref Range   WBC 11.1 (H) 4.0 - 10.5 K/uL   RBC 3.76 (L) 4.22 - 5.81 MIL/uL   Hemoglobin 11.4 (L) 13.0 - 17.0 g/dL   HCT 35.3 (L) 39.0 - 52.0 %   MCV 93.9 80.0 - 100.0 fL   MCH 30.3 26.0 - 34.0 pg   MCHC 32.3 30.0 - 36.0 g/dL   RDW 12.4 11.5 - 15.5 %   Platelets 269 150 - 400 K/uL   nRBC 0.0 0.0 - 0.2 %   Neutrophils Relative % 77 %   Neutro Abs 8.5 (H) 1.7 - 7.7 K/uL   Lymphocytes Relative 9 %   Lymphs Abs 1.0 0.7 - 4.0 K/uL   Monocytes Relative 12 %   Monocytes Absolute 1.3 (H) 0.1 - 1.0 K/uL   Eosinophils Relative 1 %   Eosinophils Absolute 0.1 0.0 - 0.5 K/uL   Basophils Relative 0 %   Basophils Absolute 0.0 0.0 - 0.1 K/uL   Immature Granulocytes 1 %   Abs Immature Granulocytes 0.11 (H) 0.00 - 0.07 K/uL    Comment: Performed at [facility], [place] [address], [place], [place] [address]   Dg Chest Port 1 View  Result Date: 04/08/2019 CLINICAL DATA:  Worsening shortness of breath and fever. History of lung cancer on chemotherapy. EXAM: PORTABLE CHEST 1 VIEW COMPARISON:  PET-CT 11/21/2018. Chest radiograph 08/12/2018. FINDINGS: A right jugular Port-A-Cath remains in place terminating over the lower SVC. Aortic atherosclerosis is noted the cardiomediastinal silhouette is unchanged from the prior radiograph. Underlying emphysema is again seen as well as evidence of some right lung volume loss. There is a small to moderate right pleural effusion which has enlarged from the prior radiograph and is new from the interval CT. Increasing airspace and interstitial opacity are present throughout the right lung base and right mid lung. Known calcified pleural plaques are most conspicuous in the left mid hemithorax. No acute osseous abnormality is seen. IMPRESSION: Enlarging, small to moderate-sized right pleural effusion with worsening right lung airspace and interstitial opacities  concerning for pneumonia. Asymmetric edema and lymphangitic tumor are also possible. Electronically Signed   By: Logan Bores M.D.   On: 04/08/2019 18:43    Pending Labs Unresulted Labs (From admission, onward)    Start     Ordered   04/08/19 1706  CBC WITH DIFFERENTIAL  ONCE -  STAT,   STAT     04/08/19 1705   04/08/19 1706  Blood Culture (routine x 2)  BLOOD CULTURE X 2,   STAT     04/08/19 1705   04/08/19 1706  Urine culture  ONCE - STAT,   STAT     04/08/19 1705   Signed and Held  Culture, blood (routine x 2) Call MD if unable to obtain prior to antibiotics being given  BLOOD CULTURE X 2,   R    Comments:  If blood cultures drawn in Emergency Department - Do not draw and cancel order    Signed and Held   Signed and Held  Culture, sputum-assessment  Once,   R     Signed and Held   Signed and Held  Gram stain  Once,   R     Signed and Held   Signed and Held  HIV antibody (Routine Screening)  Once,   R     Signed and Held   Signed and Held  Strep pneumoniae urinary antigen  Once,   R     Signed and Held   Signed and Held  Comprehensive metabolic panel  Tomorrow morning,   R     Signed and Held   Signed and Held  CBC  (enoxaparin (LOVENOX)    CrCl >/= 30 ml/min)  Once,   R    Comments:  Baseline for enoxaparin therapy IF NOT ALREADY DRAWN.  Notify MD if PLT < 100 K.    Signed and Held   Signed and Held  Creatinine, serum  (enoxaparin (LOVENOX)    CrCl >/= 30 ml/min)  Once,   R    Comments:  Baseline for enoxaparin therapy IF NOT ALREADY DRAWN.    Signed and Held   Signed and Held  Creatinine, serum  (enoxaparin (LOVENOX)    CrCl >/= 30 ml/min)  Weekly,   R    Comments:  while on enoxaparin therapy    Signed and Held   Signed and Held  Comprehensive metabolic panel  Tomorrow morning,   R     Signed and Held   Signed and Held  CBC WITH DIFFERENTIAL  Tomorrow morning,   R     Signed and Held   Signed and Held  Novel Coronavirus, NAA (hospital order; send-out to ref lab)  (Novel  Coronavirus, NAA [facility] Order; send-out to ref lab) with precautions panel)  Once,   R    Question Answer Comment  Current symptoms Fever and Cough   Excluded other viral illnesses Yes   Exposure Risk None      Signed and Held          Vitals/Pain Today's Vitals   04/08/19 2200 04/08/19 2300 04/08/19 2330 04/09/19 0000  BP: 105/71 102/64 115/67 114/70  Pulse: (!) 114 (!) 110 (!) 105 (!) 101  Resp: 19 20 (!) 23 (!) 26  Temp:      TempSrc:      SpO2: 100% 90% 97% 95%  Weight:      Height:        Isolation Precautions No active isolations  Medications Medications  vancomycin (VANCOCIN) IVPB 1000 mg/200 mL premix (has no administration in time range)  sodium chloride 0.9 % bolus 1,000 mL (0 mLs Intravenous Stopped 04/08/19 2019)  ondansetron (ZOFRAN) injection 4 mg (4 mg Intravenous Given 04/08/19 1800)  alum & mag hydroxide-simeth (MAALOX/MYLANTA) 200-200-20 MG/5ML suspension 30 mL (30 mLs Oral Given 04/08/19 1800)  And  lidocaine (XYLOCAINE) 2 % viscous mouth solution 15 mL (15 mLs Oral Given 04/08/19 1800)  vancomycin (VANCOCIN) 1,250 mg in sodium chloride 0.9 % 250 mL IVPB (0 mg Intravenous Stopped 04/08/19 2217)  piperacillin-tazobactam (ZOSYN) IVPB 3.375 g (0 g Intravenous Stopped 04/08/19 2044)  chlorproMAZINE (THORAZINE) tablet 25 mg (25 mg Oral Given 04/08/19 2030)    Mobility walks with device

## 2019-04-09 NOTE — Progress Notes (Signed)
Patient ID: Ryan Houston, male   DOB: Jul 25, 1946, 73 y.o.   MRN: [id number] Request received for thoracentesis on patient.  Latest CT scan of chest was reviewed by Dr. Kathlene Cote.  There appears to be more tumor burden than pleural fluid on latest scan.  Dr. Kathlene Cote would not recommend thoracentesis at this time.  The fluid noted is more subpulmonic and anterior with only a narrow window for access.  Please contact Dr. Kathlene Cote at [phone] with any additional questions.

## 2019-04-09 NOTE — Consult Note (Signed)
Referral MD  Reason for Referral: Locally advanced bronchogenic carcinoma; pneumonia; weakness.  Chief Complaint  Patient presents with  . Fever  . Shortness of Breath    Cancer pt, also with cough  : I just had a hard time breathing and got weak.  HPI: [name] is well-known to me.  Is a 73 year old African-American male.  He has chronic hepatitis C.  He has never been treated for this.  He has locally advanced non-small cell lung cancer of the right lung.  He was diagnosed last year.  He was not compliant with his treatments.  He had radiation therapy.  He did not come to a lot of his chemotherapy sessions.  We now have him on immunotherapy with Durvalumab.  Again, he is not been that diligent with keeping his appointments.  I think he was due for scans to see how his tumor had responded.  I think he last received Durvalumab March 25.  He now was admitted yesterday with shortness of breath.  I think he had a fever.  He had a chest x-ray done.  He had a enlarging right pleural effusion.  He had worsening right lung airspace disease.  This could be consistent with lymphangitic tumor spread or edema.  His appetite is not that great.  He has had no hemoptysis.  There is been no diarrhea.  His labs when he came in showed a white cell count 11.1.  Hemoglobin 11.4.  Platelet count was 269,000.  His calcium is 8.2.  BUN is 12 creatinine 0.85.  I had to say performance status is probably no better than ECOG 2-3.   Past Medical History:  Diagnosis Date  . Arthritis   . Cancer ([place])    lung  . Cough with hemoptysis 08/06/2018  . GERD (gastroesophageal reflux disease)   . Goals of care, counseling/discussion 08/22/2018  . Hepatitis C   . Hypercalcemia of malignancy 08/06/2018  . Hypertension   . Iron deficiency anemia due to chronic blood loss 03/25/2019  . Mediastinal mass 08/06/2018  :  Past Surgical History:  Procedure Laterality Date  . BACK SURGERY    . BRONCHIAL WASHINGS   08/13/2018   Procedure: BRONCHIAL WASHINGS;  Surgeon: Garner Nash, DO;  Location: [facility];  Service: Cardiopulmonary;;  . ENDOBRONCHIAL ULTRASOUND Bilateral 08/13/2018   Procedure: ENDOBRONCHIAL ULTRASOUND;  Surgeon: Garner Nash, DO;  Location: [facility];  Service: Cardiopulmonary;  Laterality: Bilateral;  . FINE NEEDLE ASPIRATION  08/13/2018   Procedure: FINE NEEDLE ASPIRATION (FNA) LINEAR;  Surgeon: Garner Nash, DO;  Location: [facility];  Service: Cardiopulmonary;;  . IR IMAGING GUIDED PORT INSERTION  08/08/2018  . VIDEO BRONCHOSCOPY Bilateral 08/07/2018   Procedure: VIDEO BRONCHOSCOPY WITH FLUORO;  Surgeon: Chesley Mires, MD;  Location: [facility];  Service: Endoscopy;  Laterality: Bilateral;  . VIDEO BRONCHOSCOPY  08/13/2018   Procedure: VIDEO BRONCHOSCOPY;  Surgeon: Garner Nash, DO;  Location: [facility];  Service: Cardiopulmonary;;  :   Current Facility-Administered Medications:  .  0.9 %  sodium chloride infusion, , Intravenous, Continuous, Elwyn Reach, MD, Stopped at 04/09/19 0535 .  benzonatate (TESSALON) capsule 100-200 mg, 100-200 mg, Oral, TID PRN, Jonelle Sidle, Mohammad L, MD .  ceFEPIme (MAXIPIME) 1 g in sodium chloride 0.9 % 100 mL IVPB, 1 g, Intravenous, Q8H, Garba, Mohammad L, MD, Last Rate: 200 mL/hr at 04/09/19 0600 .  cholecalciferol (VITAMIN D) tablet 2,000 Units, 2,000 Units, Oral, Daily, Jonelle Sidle, Mohammad L, MD .  enoxaparin (LOVENOX)  injection 40 mg, 40 mg, Subcutaneous, Q24H, Garba, Mohammad L, MD .  gabapentin (NEURONTIN) capsule 300 mg, 300 mg, Oral, TID, Garba, Mohammad L, MD .  hydrOXYzine (ATARAX/VISTARIL) tablet 25 mg, 25 mg, Oral, Q8H PRN, Jonelle Sidle, Mohammad L, MD .  mirabegron ER (MYRBETRIQ) tablet 25 mg, 25 mg, Oral, Daily, Garba, Mohammad L, MD .  montelukast (SINGULAIR) tablet 10 mg, 10 mg, Oral, QHS, Garba, Mohammad L, MD, 10 mg at 04/09/19 0158 .  oxyCODONE (Oxy IR/ROXICODONE) immediate release tablet 10 mg, 10 mg, Oral, Q6H PRN,  Jonelle Sidle, Mohammad L, MD .  oxyCODONE (OXYCONTIN) 12 hr tablet 20 mg, 20 mg, Oral, Q12H, Garba, Mohammad L, MD .  pantoprazole (PROTONIX) EC tablet 40 mg, 40 mg, Oral, Daily, Garba, Mohammad L, MD .  vancomycin (VANCOCIN) IVPB 1000 mg/200 mL premix, 1,000 mg, Intravenous, Q24H, Eudelia Bunch, RPH .  vitamin B-12 (CYANOCOBALAMIN) tablet 1,000 mcg, 1,000 mcg, Oral, QODAY, Garba, Mohammad L, MD:  . cholecalciferol  2,000 Units Oral Daily  . enoxaparin (LOVENOX) injection  40 mg Subcutaneous Q24H  . gabapentin  300 mg Oral TID  . mirabegron ER  25 mg Oral Daily  . montelukast  10 mg Oral QHS  . oxyCODONE  20 mg Oral Q12H  . pantoprazole  40 mg Oral Daily  . vitamin B-12  1,000 mcg Oral QODAY  :  No Known Allergies:  No family history on file.:  Social History   Socioeconomic History  . Marital status: Single    Spouse name: Not on file  . Number of children: Not on file  . Years of education: Not on file  . Highest education level: Not on file  Occupational History  . Not on file  Social Needs  . Financial resource strain: Not on file  . Food insecurity:    Worry: Not on file    Inability: Not on file  . Transportation needs:    Medical: Not on file    Non-medical: Not on file  Tobacco Use  . Smoking status: Light Tobacco Smoker    Packs/day: 1.00    Types: Cigarettes  . Smokeless tobacco: Never Used  Substance and Sexual Activity  . Alcohol use: Yes    Alcohol/week: 0.0 standard drinks    Comment: beer a day  . Drug use: Yes    Types: Marijuana    Comment: 1-2x wk  . Sexual activity: Not on file  Lifestyle  . Physical activity:    Days per week: Not on file    Minutes per session: Not on file  . Stress: Not on file  Relationships  . Social connections:    Talks on phone: Not on file    Gets together: Not on file    Attends religious service: Not on file    Active member of club or organization: Not on file    Attends meetings of clubs or organizations: Not  on file    Relationship status: Not on file  . Intimate partner violence:    Fear of current or ex partner: Not on file    Emotionally abused: Not on file    Physically abused: Not on file    Forced sexual activity: Not on file  Other Topics Concern  . Not on file  Social History Narrative  . Not on file  :  Pertinent items are noted in HPI.  Exam: Thin elderly appearing African-American male.  Head neck exam shows no ocular or oral lesions.  There is  no mucositis.  He has no adenopathy in the neck.  Lungs are with some decreased breath sounds around the right side.  He has some scattered wheezes bilaterally.  Cardiac exam tachycardic but regular.  There are no murmurs.  Abdomen is soft.  There is no guarding or rebound tenderness.  There is no palpable liver or spleen tip.  Back exam shows no tenderness over the spine ribs or hips peer extremities shows muscle atrophy in upper lower extremities. Neurological exam shows no focal neurological deficits.  Patient Vitals for the past 24 hrs:  BP Temp Temp src Pulse Resp SpO2 Height Weight  04/09/19 0347 106/75 98.1 F (36.7 C) Oral 99 18 95 % - -  04/09/19 0136 105/65 98.1 F (36.7 C) Oral (!) 103 18 97 % - -  04/09/19 0000 114/70 - - (!) 101 (!) 26 95 % - -  04/08/19 2330 115/67 - - (!) 105 (!) 23 97 % - -  04/08/19 2300 102/64 - - (!) 110 20 90 % - -  04/08/19 2200 105/71 - - (!) 114 19 100 % - -  04/08/19 2130 (!) 91/59 - - (!) 113 13 92 % - -  04/08/19 2100 96/64 - - (!) 111 (!) 26 100 % - -  04/08/19 2030 109/67 - - 100 16 97 % - -  04/08/19 2000 98/61 - - (!) 102 (!) 21 97 % - -  04/08/19 1946 108/71 - - (!) 103 16 96 % - -  04/08/19 1930 108/71 - - (!) 101 14 96 % - -  04/08/19 1900 107/68 - - - - - - -  04/08/19 1651 - - - - - - 5\' 9"  (1.753 m) 128 lb 15.5 oz (58.5 kg)  04/08/19 1649 97/66 98.9 F (37.2 C) Oral (!) 108 18 90 % - -  04/08/19 1643 - - - - - (!) 88 % - -     Recent Labs    04/08/19 1912 04/09/19 0202   WBC 11.1* 12.2*  HGB 11.4* 11.2*  HCT 35.3* 34.8*  PLT 269 269   Recent Labs    04/08/19 1812 04/09/19 0202  NA 132* 129*  K 4.0 3.5  CL 98 98  CO2 28 25  GLUCOSE 148* 141*  BUN 12 11  CREATININE 0.85 0.85  0.93  CALCIUM 8.2* 7.7*    Blood smear review: None  Pathology: None    Assessment and Plan: Mr. Wagster is a 73 year old African-American male.  He has locally advanced non-small cell lung cancer.  He currently is on immunotherapy.  I suspect that his cancer probably is progressing.  It certainly would not surprise me if it was given that he really has not been compliant with treatments.  A CT scan I think is going to be necessary.  I probably would also consider doing a thoracentesis to remove fluid from the right lung.  That can be tested for malignancy.  I told him that if we do have evidence of cancer progression that we just are not able to give him any more treatments.  He is just not strong enough to take treatment.  I think he understands this.  I reviewed the antibiotics.  I think this is reasonable for right now.  I appreciate everybody's help with Mr. Chadderdon.  He is a nice man.  Again, it is been difficult tried to keep him on schedule with his treatments.  Lattie Haw, MD  Jeneen Rinks 1:5-7

## 2019-04-09 NOTE — Progress Notes (Signed)
PROGRESS NOTE    Ryan Houston  JIR:[id number] DOB: [birth year] DOA: 04/08/2019 PCP: Rogers Blocker, MD   Brief Narrative:72 y.o. male with medical history significant of malignant neoplasm of the right bronchus, GERD, hepatitis C, hypertension, iron deficiency anemia who presents with progressive shortness of breath and cough for the last 3 days.Patient reports recurrent symptoms on and off that has gotten worse now.  He also had a fever yesterday up to 102.  He has been feeling nauseated and had one episode of vomiting.  Patient is on immunotherapy but could not go due to generalized weakness and shortness of breath so he came to the ER to be evaluated.  He is on 2 L of oxygen at home but now requiring 4 L here.  He denied any abdominal pain.  Denied any contact with somebody having Covid 19.  In the ER patient's oxygen sat was 88% on his baseline.  Currently 96% on 2 L.  He has been in and out of chemo and immunotherapy hence is considered imminent compromise.  Low risk for COVID-19 infection he is being admitted with pneumonia based on chest x-ray findings..  ED Course: Temperature is 98.9 blood pressure 91/59 pulse 114 respiratory 26 oxygen sat 88% on 2 L.  Sodium 132 potassium 4.0 chloride 98 CO2 of 28 glucose 148.  Albumin is 2.2.  White count 11.1 hemoglobin 11.4 and platelets 269.  Relatively normal differentials. Chest x-ray showed enlarging small-to-moderate sized right pleural effusion with worsening right lung airspace opacities concerning for pneumonia.  Patient initiated on antibiotics and being admitted for work-up.  Assessment & Plan:   Principal Problem:   Pneumonia Active Problems:   Chronic hepatitis C without hepatic coma (HCC)   GERD   Malnutrition of moderate degree   Primary malignant neoplasm of bronchus of right lower lobe (HCC)   #1 right lung pneumonia with pleural effusion: Most likely postobstructive pneumonia with malignant pleural effusion.  Patient will be  admitted initiated on antibiotics.  Blood cultures have been sent and sputum cultures to be obtained.  CT of the chest shows  Extensive progression of lung carcinoma in the right chest with complete occlusion of the bronchus intermedius and tumor extending throughout the bronchial tree of the right lower lobe and right middle lobe. It is suspected that the entire right lower lobe is involved with tumor and maximal tumor dimensions are approximately 10 cm. There is also more extensive metastatic lymph node involvement with larger subcarinal lymph node mass measuring 3.5 cm and contiguous tumor extending into the right hilum and elsewhere in the mediastinum.  Pleural metastatic disease is also suspected with a small to moderate component currently of pleural fluid along the base of the hemithorax and also a component of probable partially loculated fluid laterally and towards the posterior apex.  Coronary atherosclerosis with calcified plaque in a 3 vessel distribution. Patient very noncompliant to treatments reading through Dr. Antonieta Pert notes.  scheduled for thoracentesis diagnostic and therapeutic   Patient is a low risk for COVID-19 but will be placed on droplet isolation with rule out.  #2 lung cancer: Appreciate Dr. Antonieta Pert input  #3 chronic hepatitis C:  Has not been treated in the past  #4 GERD: Continue with PPIs.  #5 moderate malnutrition: Encourage intake.  Patient looks cachectic.  Nutrition consult probably.  #6 hyponatremia DC IV fluids and monitor tomorrow   DVT prophylaxis: Lovenox Code Status: Full code Family Communication:  Disposition Plan: To be determined Consults called:  Oncology   Estimated body mass index is 19.05 kg/m as calculated from the following:   Height as of this encounter: 5\' 9"  (1.753 m).   Weight as of this encounter: 58.5 kg.    Subjective: Resting in bed on 2 L of oxygen denies any new complaints  Objective: Vitals:    04/08/19 2330 04/09/19 0000 04/09/19 0136 04/09/19 0347  BP: 115/67 114/70 105/65 106/75  Pulse: (!) 105 (!) 101 (!) 103 99  Resp: (!) 23 (!) 26 18 18   Temp:   98.1 F (36.7 C) 98.1 F (36.7 C)  TempSrc:   Oral Oral  SpO2: 97% 95% 97% 95%  Weight:      Height:        Intake/Output Summary (Last 24 hours) at 04/09/2019 1123 Last data filed at 04/09/2019 1002 Gross per 24 hour  Intake 2329.12 ml  Output 703 ml  Net 1626.12 ml   Filed Weights   04/08/19 1651  Weight: 58.5 kg    Examination:  General exam: Appears calm and comfortable  Respiratory system: Diminished breath sounds on the left more than right to auscultation. Respiratory effort normal. Cardiovascular system: S1 & S2 heard, RRR. No JVD, murmurs, rubs, gallops or clicks. No pedal edema. Gastrointestinal system: Abdomen is nondistended, soft and nontender. No organomegaly or masses felt. Normal bowel sounds heard. Central nervous system: Alert and oriented. No focal neurological deficits. Extremities: Symmetric 5 x 5 power. Skin: No rashes, lesions or ulcers Psychiatry: Judgement and insight appear normal. Mood & affect appropriate.     Data Reviewed: I have personally reviewed following labs and imaging studies  CBC: Recent Labs  Lab 04/08/19 1912 04/09/19 0202  WBC 11.1* 12.2*  NEUTROABS 8.5* 9.9*  HGB 11.4* 11.2*  HCT 35.3* 34.8*  MCV 93.9 94.6  PLT 269 825   Basic Metabolic Panel: Recent Labs  Lab 04/08/19 1812 04/09/19 0202  NA 132* 129*  K 4.0 3.5  CL 98 98  CO2 28 25  GLUCOSE 148* 141*  BUN 12 11  CREATININE 0.85 0.85  0.93  CALCIUM 8.2* 7.7*   GFR: Estimated Creatinine Clearance: 64 mL/min (by C-G formula based on SCr of 0.85 mg/dL). Liver Function Tests: Recent Labs  Lab 04/08/19 1812 04/09/19 0202  AST 28 25  ALT 15 15  ALKPHOS 70 59  BILITOT 0.4 0.5  PROT 7.7 6.9  ALBUMIN 2.2* 1.9*   Recent Labs  Lab 04/08/19 1812  LIPASE 33   No results for input(s): AMMONIA in the  last 168 hours. Coagulation Profile: No results for input(s): INR, PROTIME in the last 168 hours. Cardiac Enzymes: Recent Labs  Lab 04/08/19 1812  TROPONINI <0.03   BNP (last 3 results) No results for input(s): PROBNP in the last 8760 hours. HbA1C: No results for input(s): HGBA1C in the last 72 hours. CBG: No results for input(s): GLUCAP in the last 168 hours. Lipid Profile: No results for input(s): CHOL, HDL, LDLCALC, TRIG, CHOLHDL, LDLDIRECT in the last 72 hours. Thyroid Function Tests: No results for input(s): TSH, T4TOTAL, FREET4, T3FREE, THYROIDAB in the last 72 hours. Anemia Panel: No results for input(s): VITAMINB12, FOLATE, FERRITIN, TIBC, IRON, RETICCTPCT in the last 72 hours. Sepsis Labs: No results for input(s): PROCALCITON, LATICACIDVEN in the last 168 hours.  Recent Results (from the past 240 hour(s))  Blood Culture (routine x 2)     Status: None (Preliminary result)   Collection Time: 04/08/19  6:08 PM  Result Value Ref Range Status   Specimen  Description   Final    BLOOD Performed at [facility], [place] [address], [place], [place] [address]    Special Requests   Final    BOTTLES DRAWN AEROBIC AND ANAEROBIC Blood Culture adequate volume Performed at [place] [facility] [address] [place], [place] [address]    Culture   Final    NO GROWTH < 12 HOURS Performed at [facility] [address], [place], [place] [address]    Report Status PENDING  Incomplete  Blood Culture (routine x 2)     Status: None (Preliminary result)   Collection Time: 04/08/19  6:08 PM  Result Value Ref Range Status   Specimen Description   Final    BLOOD Performed at [place] [address], [place], [place] [address]    Special Requests   Final    BOTTLES DRAWN AEROBIC AND ANAEROBIC Blood Culture adequate volume Performed at [facility] [address], [place], [place] [address]    Culture    Final    NO GROWTH < 12 HOURS Performed at [facility] [address], [place], [place] [address]    Report Status PENDING  Incomplete  Culture, blood (routine x 2) Call MD if unable to obtain prior to antibiotics being given     Status: None (Preliminary result)   Collection Time: 04/09/19  2:02 AM  Result Value Ref Range Status   Specimen Description   Final    BLOOD RIGHT ARM Performed at [facility], [place] [address] [place], [place] [address]    Special Requests   Final    BOTTLES DRAWN AEROBIC AND ANAEROBIC Blood Culture adequate volume Performed at [facility] [address], [place], [place] [address]    Culture PENDING  Incomplete   Report Status PENDING  Incomplete         Radiology Studies: Ct Chest W Contrast  Result Date: 04/09/2019 CLINICAL DATA:  History of metastatic non-small cell carcinoma of the right lung involving the bronchus intermedius with lymph node metastasis to the subcarinal region. Prior radiation and noncompliance with chemotherapy. Admitted with cough, fever and shortness of breath. Chest x-ray demonstrates right lower lung consolidation with possible component of pleural fluid. EXAM: CT CHEST WITH CONTRAST TECHNIQUE: Multidetector CT imaging of the chest was performed during intravenous contrast administration. CONTRAST:  66mL OMNIPAQUE IOHEXOL 300 MG/ML  SOLN COMPARISON:  Chest x-ray on 04/08/2019, PET scans on 11/21/2018 and 09/04/2018 and CT of the chest on 08/06/2018 FINDINGS: Cardiovascular: The heart size is normal. No pericardial fluid identified. There is calcified coronary artery plaque in a 3 vessel distribution. Scattered calcified plaque is noted in the thoracic aorta without evidence of aneurysmal disease. Proximal visualized great vessels demonstrate approximately 50% stenosis at the origin of the left subclavian artery. Central pulmonary arteries are normal in caliber. Mediastinum/Nodes: Progressive  lymphadenopathy noted since prior PET scans. Enlarged and necrotic subcarinal lymph node mass demonstrating peripheral enhancement now measures approximately 3.5 cm in short axis compared to approximately 2.5 cm at a similar level on the most recent PET scan last November. Adjacent tumor and lymph node spread now extends into the right hilum and right mediastinum and is difficult to distinguish from contiguous tumor extending along bronchovascular pathways into the right lower lobe and right middle lobe bronchus. Lungs/Pleura: The bronchus intermedius is now completely obstructed by enhancing tumor and it is suspected that the entire right middle  lobe is likely infiltrated by tumor and is non aerated. The right middle lobe bronchus is also completely obstructed by tumor. The right upper lobe remains aerated. Maximum tumor dimensions are difficult to measure. Assuming extensive right lower lobe involvement, tumor may measure as much as 10 cm in estimated diameter. There also is suspected pleural tumor with component of basilar sub pulmonic pleural effusion and a partially loculated superior and lateral component of pleural fluid. Overall pleural fluid volume is small to moderate. No nodules or masses are seen in the contralateral left lung. There is stable emphysema. Calcified pleural plaques are present bilaterally. Density at the left lung base appears largely chronic and likely relates to chronic scarring. Upper Abdomen: No obvious metastatic disease in the visualized upper abdomen. The liver shows steatosis. Musculoskeletal: No obvious bony metastatic lesions or fractures. IMPRESSION: 1. Extensive progression of lung carcinoma in the right chest with complete occlusion of the bronchus intermedius and tumor extending throughout the bronchial tree of the right lower lobe and right middle lobe. It is suspected that the entire right lower lobe is involved with tumor and maximal tumor dimensions are approximately 10  cm. There is also more extensive metastatic lymph node involvement with larger subcarinal lymph node mass measuring 3.5 cm and contiguous tumor extending into the right hilum and elsewhere in the mediastinum. 2. Pleural metastatic disease is also suspected with a small to moderate component currently of pleural fluid along the base of the hemithorax and also a component of probable partially loculated fluid laterally and towards the posterior apex. 3. Coronary atherosclerosis with calcified plaque in a 3 vessel distribution. Electronically Signed   By: Aletta Edouard M.D.   On: 04/09/2019 11:06   Dg Chest Port 1 View  Result Date: 04/08/2019 CLINICAL DATA:  Worsening shortness of breath and fever. History of lung cancer on chemotherapy. EXAM: PORTABLE CHEST 1 VIEW COMPARISON:  PET-CT 11/21/2018. Chest radiograph 08/12/2018. FINDINGS: A right jugular Port-A-Cath remains in place terminating over the lower SVC. Aortic atherosclerosis is noted the cardiomediastinal silhouette is unchanged from the prior radiograph. Underlying emphysema is again seen as well as evidence of some right lung volume loss. There is a small to moderate right pleural effusion which has enlarged from the prior radiograph and is new from the interval CT. Increasing airspace and interstitial opacity are present throughout the right lung base and right mid lung. Known calcified pleural plaques are most conspicuous in the left mid hemithorax. No acute osseous abnormality is seen. IMPRESSION: Enlarging, small to moderate-sized right pleural effusion with worsening right lung airspace and interstitial opacities concerning for pneumonia. Asymmetric edema and lymphangitic tumor are also possible. Electronically Signed   By: Logan Bores M.D.   On: 04/08/2019 18:43        Scheduled Meds: . cholecalciferol  2,000 Units Oral Daily  . enoxaparin (LOVENOX) injection  40 mg Subcutaneous Q24H  . gabapentin  300 mg Oral TID  . mirabegron ER  25  mg Oral Daily  . montelukast  10 mg Oral QHS  . oxyCODONE  20 mg Oral Q12H  . pantoprazole  40 mg Oral Daily  . sodium chloride (PF)      . vitamin B-12  1,000 mcg Oral QODAY   Continuous Infusions: . sodium chloride 125 mL/hr at 04/09/19 0922  . ceFEPime (MAXIPIME) IV 200 mL/hr at 04/09/19 0600  . vancomycin       LOS: 1 day       Georgette Shell, MD  [facility] Pager [phone]  If 7PM-7AM, please contact night-coverage [email or web address] Password [facility] 04/09/2019, 11:23 AM

## 2019-04-10 LAB — CBC
HCT: 33.4 % — ABNORMAL LOW (ref 39.0–52.0)
Hemoglobin: 10.7 g/dL — ABNORMAL LOW (ref 13.0–17.0)
MCH: 30.2 pg (ref 26.0–34.0)
MCHC: 32 g/dL (ref 30.0–36.0)
MCV: 94.4 fL (ref 80.0–100.0)
Platelets: 288 10*3/uL (ref 150–400)
RBC: 3.54 MIL/uL — ABNORMAL LOW (ref 4.22–5.81)
RDW: 12.2 % (ref 11.5–15.5)
WBC: 11.7 10*3/uL — ABNORMAL HIGH (ref 4.0–10.5)
nRBC: 0 % (ref 0.0–0.2)

## 2019-04-10 LAB — BASIC METABOLIC PANEL
Anion gap: 6 (ref 5–15)
BUN: 10 mg/dL (ref 8–23)
CO2: 26 mmol/L (ref 22–32)
Calcium: 8 mg/dL — ABNORMAL LOW (ref 8.9–10.3)
Chloride: 98 mmol/L (ref 98–111)
Creatinine, Ser: 0.8 mg/dL (ref 0.61–1.24)
GFR calc Af Amer: >60 mL/min (ref >60)
GFR calc non Af Amer: >60 mL/min (ref >60)
Glucose, Bld: 184 mg/dL — ABNORMAL HIGH (ref 70–99)
Potassium: 3.8 mmol/L (ref 3.5–5.1)
Sodium: 130 mmol/L — ABNORMAL LOW (ref 135–145)

## 2019-04-10 NOTE — Progress Notes (Signed)
Unfortunately, it looks like the CT scan really shows is what is going on.  He has rapid growth of his tumor.  It now is enveloping a lot of his right lung.  Looks like the right middle lobe bronchus is obstructed.  I had a long talk with Ryan Houston this morning.  His brother was on the cell phone.  I told him that his cancer was progressing quickly.  Given his poor performance status, he really was not a candidate for any further treatment.  I talked to Ryan Houston about hospice.  I told him that I thought that he would not make it through the month of April.  We did discuss his CODE STATUS.  I talked to him about what he meant to be kept alive on machines and have CPR done and being intubated.  He does not want this.  I totally agree.  As such, he is a DO NOT RESUSCITATE.  He does not have a good living situation.  I really believe that he is going to need residential hospice.  He lives in [place].  I will see if [facility] will be able to take him at some point soon.  I think that [facility] would be appropriate for him.  He and his brother agree to this.  I am not sure he really needs antibiotics.  Again, looks like tumor is what the issue is.  He needs comfort at this point.  He needs respect and dignity.  And he will get this with hospice, particularly if he is at [facility].  His labs show white cell count 11.7.  Hemoglobin 10.7.  Platelet count 288,000.  His sodium is 130 potassium 3.8.  BUN 10 creatinine 0.8.  Again, I think that if he can get to [facility] this would be best for him.  I just doubt that he has much reserve left.  His albumin is only 1.9.  I am sure that his pre-albumin is going to be less than 10.  I very much appreciate the excellent care that he is gotten from everybody on [address].  Lattie Haw, MD  [facility] 23:43

## 2019-04-10 NOTE — Progress Notes (Addendum)
[facility] [id number] Referral RN note @ [phone]  Received request from Dr. Marin Olp for patient interest in [facility]. Chart reviewed and eligibility approved. Received report from bedside RN, Jarrett Soho.   Spoke with patient to confirm interest. Patient also requested that I speak with his brother, NNAEMEKA SAMSON [phone]). Explained services. Pt and brother are agreeable to transfer when Covid-19 results are returned (may transfer regardless if results are positive or negative, however, a result is needed). Cookie McGibboney, RNCM is aware.   Registration paperwork to be completed at 3pm today. Dr. Orpah Melter to assume care per family request.   Please fax discharge summary to [phone].  RN please call report to [phone] prior to patient transfer.  Thank you, Margaretmary Eddy, RN, BSN [place] [phone]  [place] are on [place].

## 2019-04-10 NOTE — TOC Initial Note (Signed)
Transition of Care [facility]) - Initial/Assessment Note    Patient Details  Name: Ryan Houston MRN: [id number] Date of Birth: Dec 16, 1946  Transition of Care [facility]) CM/SW Contact:    Wende Neighbors, LCSW Phone Number: 04/10/2019, 9:38 AM  Clinical Narrative:    CSW received phone call from [facility]) stating that she received a consult to assist patient in being place into [facility] per patients oncologist. Olivia Mackie asked if she would be able to start process with patient. CSW stated that would be okay since per MD patient is agreeable to go to [facility]. Olivia Mackie stated she will reach back out to CSW once paperwork has been completed                Expected Discharge Plan: [place] Barriers to Discharge: No Barriers Identified   Patient Goals and CMS Choice   CMS Medicare.gov Compare Post Acute Care list provided to:: Patient Choice offered to / list presented to : Patient(per hospice MD spoke to pateint about facility)  Expected Discharge Plan and Services Expected Discharge Plan: [place]     Post Acute Care Choice: Hospice Living arrangements for the past 2 months: Single Family Home                          Prior Living Arrangements/Services Living arrangements for the past 2 months: Single Family Home Lives with:: Self          Need for Family Participation in Patient Care: Yes (Comment)   Current home services: Hospice    Activities of Daily Living Home Assistive Devices/Equipment: Cane (specify quad or straight) ADL Screening (condition at time of admission) Patient's cognitive ability adequate to safely complete daily activities?: No Is the patient deaf or have difficulty hearing?: No Does the patient have difficulty seeing, even when wearing glasses/contacts?: No Does the patient have difficulty concentrating, remembering, or making decisions?: No Patient able to express need for assistance with ADLs?: Yes Does the  patient have difficulty dressing or bathing?: Yes Independently performs ADLs?: No Communication: Independent Dressing (OT): Needs assistance Is this a change from baseline?: Pre-admission baseline Grooming: Needs assistance Is this a change from baseline?: Pre-admission baseline Feeding: Independent Bathing: Needs assistance Is this a change from baseline?: Pre-admission baseline Toileting: Needs assistance Is this a change from baseline?: Pre-admission baseline In/Out Bed: Needs assistance Is this a change from baseline?: Pre-admission baseline Walks in Home: Independent with device (comment)(using cane) Does the patient have difficulty walking or climbing stairs?: Yes Weakness of Legs: Both Weakness of Arms/Hands: None  Permission Sought/Granted Permission sought to share information with : Family Supports                Emotional Assessment Appearance:: Appears stated age       Alcohol / Substance Use: Not Applicable Psych Involvement: No (comment)  Admission diagnosis:  HCAP (healthcare-associated pneumonia) [J18.9] Patient Active Problem List   Diagnosis Date Noted  . Pneumonia 04/08/2019  . Iron deficiency anemia due to chronic blood loss 03/25/2019  . Goals of care, counseling/discussion 08/22/2018  . Mediastinal adenopathy   . Primary malignant neoplasm of bronchus of right lower lobe ([place])   . Fever   . Malnutrition of moderate degree 08/08/2018  . Mediastinal mass 08/06/2018  . Cough with hemoptysis 08/06/2018  . Hypercalcemia of malignancy 08/06/2018  . Lung mass 08/06/2018  . Chronic hepatitis C without hepatic coma ([place]) 06/13/2007  .  ANXIETY 06/13/2007  . HEMORRHOIDS 06/13/2007  . GERD 06/13/2007  . DERMATITIS, CONTACT, NOS 06/13/2007  . SPONDYLOSIS, LUMBOSACRAL 06/13/2007  . HERNIATED DISC 06/13/2007   PCP:  Rogers Blocker, MD Pharmacy:   [facility] [place], [place] AT [address] [place] [address] Phone: [phone] Fax: [phone]     Social Determinants of Health (SDOH) Interventions    Readmission Risk Interventions No flowsheet data found.

## 2019-04-10 NOTE — Discharge Summary (Addendum)
Physician Discharge Summary  Ryan Houston FWY:[id number] DOB: 12/03/1946 DOA: 04/08/2019  PCP: Rogers Blocker, MD  Admit date: 04/08/2019 Discharge date: 04/12/2019 Admitted From: Home Disposition: Hospice  COVID  19 NEGATIVE  Recommendations for Outpatient Follow-up: Hospice discharge   Home Health: None Equipment/Devices: None Discharge Condition hospice discharge  CODE STATUS: DNR Diet recommendation regular diet Brief/Interim Summary:73 y.o.malewith medical history significant ofmalignant neoplasm of the right bronchus, GERD, hepatitis C, hypertension, iron deficiency anemia who presents with progressive shortness of breath and cough for the last 3 days.Patient reports recurrent symptoms on and offthat has gotten worse now. He also had a fever yesterday up to 102. He has been feeling nauseated and had one episode of vomiting. Patient is on immunotherapy but could not go due to generalized weakness and shortness of breath so he came to the ER to be evaluated. He is on 2 L of oxygen at home but now requiring 4 L here. He denied any abdominal pain. Denied any contact with somebody having [place].In the ER patient's oxygen sat was 88% on his baseline. Currently 96% on 2 L. He has been in and out of chemo and immunotherapy hence is considered imminent compromise. Low risk for COVID-19 infection he is being admitted with pneumonia based on chest x-ray findings..  ED Course:Temperature is 98.9 blood pressure 91/59 pulse 114 respiratory 26 oxygen sat 88% on 2 L. Sodium 132 potassium 4.0 chloride 98 CO2 of 28 glucose 148. Albumin is 2.2. White count 11.1 hemoglobin 11.4 and platelets 269. Relatively normal differentials. Chest x-ray showed enlarging small-to-moderate sized right pleural effusion with worsening right lung airspace opacities concerning for pneumonia.Patient initiated on antibiotics and being admitted for work-up.   Discharge Diagnoses:  Principal Problem:    Pneumonia Active Problems:   Chronic hepatitis C without hepatic coma (HCC)   GERD   Malnutrition of moderate degree   Primary malignant neoplasm of bronchus of right lower lobe (HCC)  #1 right lung pneumonia with pleural effusion: Most likely postobstructive pneumonia with malignant pleural effusion..  CT of the chest shows  Extensive progression of lung carcinoma in the right chest with complete occlusion of the bronchus intermedius and tumor extending throughout the bronchial tree of the right lower lobe and right middle lobe. It is suspected that the entire right lower lobe is involved with tumor and maximal tumor dimensions are approximately 10 cm. There is also more extensive metastatic lymph node involvement with larger subcarinal lymph node mass measuring 3.5 cm and contiguous tumor extending into the right hilum and elsewhere in the mediastinum.  Pleural metastatic disease is also suspected with a small to moderate component currently of pleural fluid along the base of the hemithorax and also a component of probable partially loculated fluid laterally and towards the posterior apex.  Coronary atherosclerosis with calcified plaque in a 3 vessel Distribution. COVID NEGATIVE Patient will be discharged with hospice to [facility] today for comfort care.  Estimated body mass index is 19.05 kg/m as calculated from the following:   Height as of this encounter: 5\' 9"  (1.753 m).   Weight as of this encounter: 58.5 kg.  Discharge Instructions  Discharge Instructions    Diet - low sodium heart healthy   Complete by:  As directed    Increase activity slowly   Complete by:  As directed      Allergies as of 04/10/2019   No Known Allergies     Medication List    STOP taking these medications  benzonatate 100 MG capsule Commonly known as:  Tessalon Perles   gabapentin 300 MG capsule Commonly known as:  NEURONTIN   hydrOXYzine 25 MG tablet Commonly known as:   ATARAX/VISTARIL   ipratropium-albuterol 0.5-2.5 (3) MG/3ML Soln Commonly known as:  DUONEB   lidocaine-prilocaine cream Commonly known as:  EMLA   mirabegron ER 25 MG Tb24 tablet Commonly known as:  MYRBETRIQ   montelukast 10 MG tablet Commonly known as:  SINGULAIR   omeprazole 20 MG capsule Commonly known as:  PRILOSEC   oxyCODONE ER 18 MG C12a Commonly known as:  Xtampza ER   Oxycodone HCl 10 MG Tabs   senna-docusate 8.6-50 MG tablet Commonly known as:  Senokot-S   vitamin B-12 1000 MCG tablet Commonly known as:  CYANOCOBALAMIN   Vitamin D-3 25 MCG (1000 UT) Caps       No Known Allergies  Consultations:  Dr. Marin Olp   Procedures/Studies: Ct Chest W Contrast  Result Date: 04/09/2019 CLINICAL DATA:  History of metastatic non-small cell carcinoma of the right lung involving the bronchus intermedius with lymph node metastasis to the subcarinal region. Prior radiation and noncompliance with chemotherapy. Admitted with cough, fever and shortness of breath. Chest x-ray demonstrates right lower lung consolidation with possible component of pleural fluid. EXAM: CT CHEST WITH CONTRAST TECHNIQUE: Multidetector CT imaging of the chest was performed during intravenous contrast administration. CONTRAST:  15mL OMNIPAQUE IOHEXOL 300 MG/ML  SOLN COMPARISON:  Chest x-ray on 04/08/2019, PET scans on 11/21/2018 and 09/04/2018 and CT of the chest on 08/06/2018 FINDINGS: Cardiovascular: The heart size is normal. No pericardial fluid identified. There is calcified coronary artery plaque in a 3 vessel distribution. Scattered calcified plaque is noted in the thoracic aorta without evidence of aneurysmal disease. Proximal visualized great vessels demonstrate approximately 50% stenosis at the origin of the left subclavian artery. Central pulmonary arteries are normal in caliber. Mediastinum/Nodes: Progressive lymphadenopathy noted since prior PET scans. Enlarged and necrotic subcarinal lymph node  mass demonstrating peripheral enhancement now measures approximately 3.5 cm in short axis compared to approximately 2.5 cm at a similar level on the most recent PET scan last November. Adjacent tumor and lymph node spread now extends into the right hilum and right mediastinum and is difficult to distinguish from contiguous tumor extending along bronchovascular pathways into the right lower lobe and right middle lobe bronchus. Lungs/Pleura: The bronchus intermedius is now completely obstructed by enhancing tumor and it is suspected that the entire right middle lobe is likely infiltrated by tumor and is non aerated. The right middle lobe bronchus is also completely obstructed by tumor. The right upper lobe remains aerated. Maximum tumor dimensions are difficult to measure. Assuming extensive right lower lobe involvement, tumor may measure as much as 10 cm in estimated diameter. There also is suspected pleural tumor with component of basilar sub pulmonic pleural effusion and a partially loculated superior and lateral component of pleural fluid. Overall pleural fluid volume is small to moderate. No nodules or masses are seen in the contralateral left lung. There is stable emphysema. Calcified pleural plaques are present bilaterally. Density at the left lung base appears largely chronic and likely relates to chronic scarring. Upper Abdomen: No obvious metastatic disease in the visualized upper abdomen. The liver shows steatosis. Musculoskeletal: No obvious bony metastatic lesions or fractures. IMPRESSION: 1. Extensive progression of lung carcinoma in the right chest with complete occlusion of the bronchus intermedius and tumor extending throughout the bronchial tree of the right lower lobe and right middle  lobe. It is suspected that the entire right lower lobe is involved with tumor and maximal tumor dimensions are approximately 10 cm. There is also more extensive metastatic lymph node involvement with larger subcarinal  lymph node mass measuring 3.5 cm and contiguous tumor extending into the right hilum and elsewhere in the mediastinum. 2. Pleural metastatic disease is also suspected with a small to moderate component currently of pleural fluid along the base of the hemithorax and also a component of probable partially loculated fluid laterally and towards the posterior apex. 3. Coronary atherosclerosis with calcified plaque in a 3 vessel distribution. Electronically Signed   By: Aletta Edouard M.D.   On: 04/09/2019 11:06   Dg Chest Port 1 View  Result Date: 04/08/2019 CLINICAL DATA:  Worsening shortness of breath and fever. History of lung cancer on chemotherapy. EXAM: PORTABLE CHEST 1 VIEW COMPARISON:  PET-CT 11/21/2018. Chest radiograph 08/12/2018. FINDINGS: A right jugular Port-A-Cath remains in place terminating over the lower SVC. Aortic atherosclerosis is noted the cardiomediastinal silhouette is unchanged from the prior radiograph. Underlying emphysema is again seen as well as evidence of some right lung volume loss. There is a small to moderate right pleural effusion which has enlarged from the prior radiograph and is new from the interval CT. Increasing airspace and interstitial opacity are present throughout the right lung base and right mid lung. Known calcified pleural plaques are most conspicuous in the left mid hemithorax. No acute osseous abnormality is seen. IMPRESSION: Enlarging, small to moderate-sized right pleural effusion with worsening right lung airspace and interstitial opacities concerning for pneumonia. Asymmetric edema and lymphangitic tumor are also possible. Electronically Signed   By: Logan Bores M.D.   On: 04/08/2019 18:43    (Echo, Carotid, EGD, Colonoscopy, ERCP)    Subjective:   Discharge Exam: Vitals:   04/09/19 0347 04/09/19 1313  BP: 106/75 93/64  Pulse: 99 96  Resp: 18 18  Temp: 98.1 F (36.7 C) 98.2 F (36.8 C)  SpO2: 95% 99%   Vitals:   04/09/19 0000 04/09/19 0136  04/09/19 0347 04/09/19 1313  BP: 114/70 105/65 106/75 93/64  Pulse: (!) 101 (!) 103 99 96  Resp: (!) 26 18 18 18   Temp:  98.1 F (36.7 C) 98.1 F (36.7 C) 98.2 F (36.8 C)  TempSrc:  Oral Oral Oral  SpO2: 95% 97% 95% 99%  Weight:      Height:        General: Pt is alert, awake, not in acute distress Cardiovascular: RRR, S1/S2 +, no rubs, no gallops Respiratory: Coarse breath sounds bilaterally, no wheezing, no rhonchi Abdominal: Soft, NT, ND, bowel sounds + Extremities: no edema, no cyanosis    The results of significant diagnostics from this hospitalization (including imaging, microbiology, ancillary and laboratory) are listed below for reference.     Microbiology: Recent Results (from the past 240 hour(s))  Urine culture     Status: Abnormal   Collection Time: 04/08/19  5:06 PM  Result Value Ref Range Status   Specimen Description   Final    URINE, CLEAN CATCH Performed at [facility], [place] [address], [place], [place] [address]    Special Requests   Final    NONE Performed at [facility], [place] [address], [place],  [address]    Culture MULTIPLE SPECIES PRESENT, SUGGEST RECOLLECTION (A)  Final   Report Status 04/09/2019 FINAL  Final  Blood Culture (routine x 2)     Status: None (Preliminary result)  Collection Time: 04/08/19  6:08 PM  Result Value Ref Range Status   Specimen Description   Final    BLOOD Performed at [facility] [address], [place], [place] [address]    Special Requests   Final    BOTTLES DRAWN AEROBIC AND ANAEROBIC Blood Culture adequate volume Performed at [facility] [address] [place], [place] [address]    Culture   Final    NO GROWTH < 24 HOURS Performed at [facility] [address], [place], [place] [address]    Report Status PENDING  Incomplete  Blood Culture (routine x 2)     Status: None (Preliminary result)   Collection Time:  04/08/19  6:08 PM  Result Value Ref Range Status   Specimen Description   Final    BLOOD SITE NOT SPECIFIED Performed at [facility], [place] [address] [place], [place] [address]    Special Requests   Final    BOTTLES DRAWN AEROBIC AND ANAEROBIC Blood Culture adequate volume Performed at [facility] [address], [place], [place] [address]    Culture   Final    NO GROWTH < 24 HOURS Performed at [facility] [address] [place], [place] [address]    Report Status PENDING  Incomplete  Culture, blood (routine x 2) Call MD if unable to obtain prior to antibiotics being given     Status: None (Preliminary result)   Collection Time: 04/09/19  2:02 AM  Result Value Ref Range Status   Specimen Description   Final    BLOOD RIGHT ARM Performed at [facility], [place] [address] [place], [place] [address]    Special Requests   Final    BOTTLES DRAWN AEROBIC AND ANAEROBIC Blood Culture adequate volume Performed at [facility] [address] [place], [place] [address]    Culture PENDING  Incomplete   Report Status PENDING  Incomplete  MRSA PCR Screening     Status: None   Collection Time: 04/09/19  4:08 PM  Result Value Ref Range Status   MRSA by PCR NEGATIVE NEGATIVE Final    Comment:        The GeneXpert MRSA Assay (FDA approved for NASAL specimens only), is one component of a comprehensive MRSA colonization surveillance program. It is not intended to diagnose MRSA infection nor to guide or monitor treatment for MRSA infections. Performed at [facility], [place] [address] [place], [place] [address]      Labs: BNP (last 3 results) No results for input(s): BNP in the last 8760 hours. Basic Metabolic Panel: Recent Labs  Lab 04/08/19 1812 04/09/19 0202 04/10/19 0351  NA 132* 129* 130*  K 4.0 3.5 3.8  CL 98 98 98  CO2 28 25 26   GLUCOSE 148* 141* 184*  BUN 12 11 10   CREATININE 0.85 0.85   0.93 0.80  CALCIUM 8.2* 7.7* 8.0*   Liver Function Tests: Recent Labs  Lab 04/08/19 1812 04/09/19 0202  AST 28 25  ALT 15 15  ALKPHOS 70 59  BILITOT 0.4 0.5  PROT 7.7 6.9  ALBUMIN 2.2* 1.9*   Recent Labs  Lab 04/08/19 1812  LIPASE 33   No results for input(s): AMMONIA in the last 168 hours. CBC: Recent Labs  Lab 04/08/19 1912 04/09/19 0202 04/10/19 0351  WBC 11.1* 12.2* 11.7*  NEUTROABS 8.5* 9.9*  --   HGB 11.4* 11.2* 10.7*  HCT 35.3* 34.8* 33.4*  MCV 93.9 94.6 94.4  PLT 269 269 288   Cardiac Enzymes: Recent Labs  Lab 04/08/19 1812  TROPONINI <0.03   BNP: Invalid input(s): POCBNP CBG: No results for input(s): GLUCAP in the last 168 hours. D-Dimer No results for input(s): DDIMER in the last 72 hours. Hgb A1c No results for input(s): HGBA1C in the last 72 hours. Lipid Profile No results for input(s): CHOL, HDL, LDLCALC, TRIG, CHOLHDL, LDLDIRECT in the last 72 hours. Thyroid function studies No results for input(s): TSH, T4TOTAL, T3FREE, THYROIDAB in the last 72 hours.  Invalid input(s): FREET3 Anemia work up No results for input(s): VITAMINB12, FOLATE, FERRITIN, TIBC, IRON, RETICCTPCT in the last 72 hours. Urinalysis    Component Value Date/Time   COLORURINE AMBER (A) 04/08/2019 1706   APPEARANCEUR CLEAR 04/08/2019 1706   LABSPEC 1.013 04/08/2019 1706   PHURINE 6.0 04/08/2019 1706   GLUCOSEU NEGATIVE 04/08/2019 1706   HGBUR NEGATIVE 04/08/2019 1706   BILIRUBINUR NEGATIVE 04/08/2019 1706   KETONESUR NEGATIVE 04/08/2019 1706   PROTEINUR NEGATIVE 04/08/2019 1706   NITRITE NEGATIVE 04/08/2019 1706   LEUKOCYTESUR NEGATIVE 04/08/2019 1706   Sepsis Labs Invalid input(s): PROCALCITONIN,  WBC,  LACTICIDVEN Microbiology Recent Results (from the past 240 hour(s))  Urine culture     Status: Abnormal   Collection Time: 04/08/19  5:06 PM  Result Value Ref Range Status   Specimen Description   Final    URINE, CLEAN CATCH Performed at [facility], [place] [address] [place], [place] [address]    Special Requests   Final    NONE Performed at [facility], [place] [address], [place], [place] [address]    Culture MULTIPLE SPECIES PRESENT, SUGGEST RECOLLECTION (A)  Final   Report Status 04/09/2019 FINAL  Final  Blood Culture (routine x 2)     Status: None (Preliminary result)   Collection Time: 04/08/19  6:08 PM  Result Value Ref Range Status   Specimen Description   Final    BLOOD Performed at [facility] [address], [place], [place] [address]    Special Requests   Final    BOTTLES DRAWN AEROBIC AND ANAEROBIC Blood Culture adequate volume Performed at [facility] [address] [place], [place] [address]    Culture   Final    NO GROWTH < 24 HOURS Performed at [facility] [address], [place], [place] [address]    Report Status PENDING  Incomplete  Blood Culture (routine x 2)     Status: None (Preliminary result)   Collection Time: 04/08/19  6:08 PM  Result Value Ref Range Status   Specimen Description   Final    BLOOD SITE NOT SPECIFIED Performed at [facility], [place] [address], [place], [place] [address]    Special Requests   Final    BOTTLES DRAWN AEROBIC AND ANAEROBIC Blood Culture adequate volume Performed at [facility] [address], [place], [place] [address]    Culture   Final    NO GROWTH < 24 HOURS Performed at [facility] [address] [place],  [address]    Report Status PENDING  Incomplete  Culture, blood (routine x 2) Call MD if unable to obtain prior to antibiotics being given     Status: None (Preliminary result)   Collection Time: 04/09/19  2:02 AM  Result Value Ref Range Status   Specimen Description  Final    BLOOD RIGHT ARM Performed at [facility], [place] [address] [place], [place] [address]    Special Requests   Final    BOTTLES DRAWN AEROBIC AND  ANAEROBIC Blood Culture adequate volume Performed at [facility] [address], [place],  [address]    Culture PENDING  Incomplete   Report Status PENDING  Incomplete  MRSA PCR Screening     Status: None   Collection Time: 04/09/19  4:08 PM  Result Value Ref Range Status   MRSA by PCR NEGATIVE NEGATIVE Final    Comment:        The GeneXpert MRSA Assay (FDA approved for NASAL specimens only), is one component of a comprehensive MRSA colonization surveillance program. It is not intended to diagnose MRSA infection nor to guide or monitor treatment for MRSA infections. Performed at [facility], [place] [address] [place],  [address]      Time coordinating discharge: 34  minutes  SIGNED:   Georgette Shell, MD  [facility] 04/10/2019, 9:45 AM Pager   If 7PM-7AM, please contact night-coverage [email or web address] Password [email or web address]

## 2019-04-10 NOTE — Progress Notes (Signed)
PROGRESS NOTE    Ryan Houston  VXB:[id number] DOB: 07/03/1946 DOA: 04/08/2019 PCP: Rogers Blocker, MD   Brief Narrative: 73 y.o.malewith medical history significant ofmalignant neoplasm of the right bronchus, GERD, hepatitis C, hypertension, iron deficiency anemia who presents with progressive shortness of breath and cough for the last 3 days.Patient reports recurrent symptoms on and offthat has gotten worse now. He also had a fever yesterday up to 102. He has been feeling nauseated and had one episode of vomiting. Patient is on immunotherapy but could not go due to generalized weakness and shortness of breath so he came to the ER to be evaluated. He is on 2 L of oxygen at home but now requiring 4 L here. He denied any abdominal pain. Denied any contact with somebody having [place].In the ER patient's oxygen sat was 88% on his baseline. Currently 96% on 2 L. He has been in and out of chemo and immunotherapy hence is considered imminent compromise. Low risk for COVID-19 infection he is being admitted with pneumonia based on chest x-ray findings..  ED Course:Temperature is 98.9 blood pressure 91/59 pulse 114 respiratory 26 oxygen sat 88% on 2 L. Sodium 132 potassium 4.0 chloride 98 CO2 of 28 glucose 148. Albumin is 2.2. White count 11.1 hemoglobin 11.4 and platelets 269. Relatively normal differentials. Chest x-ray showed enlarging small-to-moderate sized right pleural effusion with worsening right lung airspace opacities concerning for pneumonia.Patient initiated on antibiotics and being admitted for work-up.   Assessment & Plan:   Principal Problem:   Pneumonia Active Problems:   Chronic hepatitis C without hepatic coma (HCC)   GERD   Malnutrition of moderate degree   Primary malignant neoplasm of bronchus of right lower lobe (HCC)  #1 right lung pneumonia with pleural effusion: Most likely postobstructive pneumonia with malignant pleural effusion. Patient will be  admitted initiated on antibiotics. Blood cultures have been sent and sputum cultures to be obtained.  CT of the chest shows  Extensive progression of lung carcinoma in the right chest with complete occlusion of the bronchus intermedius and tumor extending throughout the bronchial tree of the right lower lobe and right middle lobe. It is suspected that the entire right lower lobe is involved with tumor and maximal tumor dimensions are approximately 10 cm. There is also more extensive metastatic lymph node involvement with larger subcarinal lymph node mass measuring 3.5 cm and contiguous tumor extending into the right hilum and elsewhere in the mediastinum.  Pleural metastatic disease is also suspected with a small to moderate component currently of pleural fluid along the base of the hemithorax and also a component of probable partially loculated fluid laterally and towards the posterior apex.  Coronary atherosclerosis with calcified plaque in a 3 vessel distribution.  [facility] cannot [facility] him without negative COVID. Patient is a low risk for COVID-19 but will be placed on droplet isolation with rule out.  #2 lung cancer: Appreciate Dr. Antonieta Pert input  #3 chronic hepatitis C: Has not been treated in the past  #4 GERD:Continue with PPIs.  #5 moderate malnutrition:Encourage intake. Patient looks cachectic. Nutrition consult probably.  #6 hyponatremia DC IV fluids and monitor tomorrow   DVT prophylaxis:Lovenox Code Status:Full code Family Communication: Disposition Plan:To be determined Consults called: Oncology    Estimated body mass index is 19.05 kg/m as calculated from the following:   Height as of this encounter: 5\' 9"  (1.753 m).   Weight as of this encounter: 58.5 kg.    Subjective: RESTING IN BED  Objective: Vitals:   04/09/19 0000 04/09/19 0136 04/09/19 0347 04/09/19 1313  BP: 114/70 105/65 106/75 93/64  Pulse: (!) 101 (!) 103 99 96   Resp: (!) 26 18 18 18   Temp:  98.1 F (36.7 C) 98.1 F (36.7 C) 98.2 F (36.8 C)  TempSrc:  Oral Oral Oral  SpO2: 95% 97% 95% 99%  Weight:      Height:        Intake/Output Summary (Last 24 hours) at 04/10/2019 1207 Last data filed at 04/10/2019 8127 Gross per 24 hour  Intake 989.82 ml  Output 1850 ml  Net -860.18 ml   Filed Weights   04/08/19 1651  Weight: 58.5 kg    Examination:  General exam: Appears calm and comfortable  Respiratory system: scattered rhonchi to auscultation. Respiratory effort normal. Cardiovascular system: S1 & S2 heard, RRR. No JVD, murmurs, rubs, gallops or clicks. No pedal edema. Gastrointestinal system: Abdomen is nondistended, soft and nontender. No organomegaly or masses felt. Normal bowel sounds heard. Central nervous system: Alert and oriented. No focal neurological deficits. Extremities: Symmetric 5 x 5 power. Skin: No rashes, lesions or ulcers Psychiatry: Judgement and insight appear normal. Mood & affect appropriate.     Data Reviewed: I have personally reviewed following labs and imaging studies  CBC: Recent Labs  Lab 04/08/19 1912 04/09/19 0202 04/10/19 0351  WBC 11.1* 12.2* 11.7*  NEUTROABS 8.5* 9.9*  --   HGB 11.4* 11.2* 10.7*  HCT 35.3* 34.8* 33.4*  MCV 93.9 94.6 94.4  PLT 269 269 517   Basic Metabolic Panel: Recent Labs  Lab 04/08/19 1812 04/09/19 0202 04/10/19 0351  NA 132* 129* 130*  K 4.0 3.5 3.8  CL 98 98 98  CO2 28 25 26   GLUCOSE 148* 141* 184*  BUN 12 11 10   CREATININE 0.85 0.85  0.93 0.80  CALCIUM 8.2* 7.7* 8.0*   GFR: Estimated Creatinine Clearance: 68 mL/min (by C-G formula based on SCr of 0.8 mg/dL). Liver Function Tests: Recent Labs  Lab 04/08/19 1812 04/09/19 0202  AST 28 25  ALT 15 15  ALKPHOS 70 59  BILITOT 0.4 0.5  PROT 7.7 6.9  ALBUMIN 2.2* 1.9*   Recent Labs  Lab 04/08/19 1812  LIPASE 33   No results for input(s): AMMONIA in the last 168 hours. Coagulation Profile: No results  for input(s): INR, PROTIME in the last 168 hours. Cardiac Enzymes: Recent Labs  Lab 04/08/19 1812  TROPONINI <0.03   BNP (last 3 results) No results for input(s): PROBNP in the last 8760 hours. HbA1C: No results for input(s): HGBA1C in the last 72 hours. CBG: No results for input(s): GLUCAP in the last 168 hours. Lipid Profile: No results for input(s): CHOL, HDL, LDLCALC, TRIG, CHOLHDL, LDLDIRECT in the last 72 hours. Thyroid Function Tests: No results for input(s): TSH, T4TOTAL, FREET4, T3FREE, THYROIDAB in the last 72 hours. Anemia Panel: No results for input(s): VITAMINB12, FOLATE, FERRITIN, TIBC, IRON, RETICCTPCT in the last 72 hours. Sepsis Labs: No results for input(s): PROCALCITON, LATICACIDVEN in the last 168 hours.  Recent Results (from the past 240 hour(s))  Urine culture     Status: Abnormal   Collection Time: 04/08/19  5:06 PM  Result Value Ref Range Status   Specimen Description   Final    URINE, CLEAN CATCH Performed at [facility], [place] [address], [place], [place] [address]    Special Requests   Final    NONE Performed at [facility], [place]  [address], [place], [place] [address]    Culture MULTIPLE SPECIES PRESENT, SUGGEST RECOLLECTION (A)  Final   Report Status 04/09/2019 FINAL  Final  Blood Culture (routine x 2)     Status: None (Preliminary result)   Collection Time: 04/08/19  6:08 PM  Result Value Ref Range Status   Specimen Description   Final    BLOOD Performed at [place] [address] [place], [place] [address]    Special Requests   Final    BOTTLES DRAWN AEROBIC AND ANAEROBIC Blood Culture adequate volume Performed at [place] [facility] [address] [place], [place] [address]    Culture   Final    NO GROWTH 2 DAYS Performed at [place] [address], [place], [place] [address]    Report Status PENDING  Incomplete  Blood Culture (routine x 2)      Status: None (Preliminary result)   Collection Time: 04/08/19  6:08 PM  Result Value Ref Range Status   Specimen Description   Final    BLOOD SITE NOT SPECIFIED Performed at [facility], [place] [address] [place], [place] [address]    Special Requests   Final    BOTTLES DRAWN AEROBIC AND ANAEROBIC Blood Culture adequate volume Performed at [place] [address], [place], [place] [address]    Culture   Final    NO GROWTH 2 DAYS Performed at [place] [address], [place], [place] [address]    Report Status PENDING  Incomplete  Culture, blood (routine x 2) Call MD if unable to obtain prior to antibiotics being given     Status: None (Preliminary result)   Collection Time: 04/09/19  2:02 AM  Result Value Ref Range Status   Specimen Description   Final    BLOOD LEFT ARM Performed at [place]  [address] [place], [place] [address]    Special Requests   Final    BOTTLES DRAWN AEROBIC AND ANAEROBIC Blood Culture adequate volume Performed at [place] [address], [place], [place] [address]    Culture   Final    NO GROWTH 1 DAY Performed at [facility], [place] [address] [place], [place] [address]    Report Status PENDING  Incomplete  Culture, blood (routine x 2) Call MD if unable to obtain prior to antibiotics being given     Status: None (Preliminary result)   Collection Time: 04/09/19  2:02 AM  Result Value Ref Range Status   Specimen Description   Final    BLOOD RIGHT ARM Performed at [facility], [place] [address] [place], [place] [address]    Special Requests   Final    BOTTLES DRAWN AEROBIC AND ANAEROBIC Blood Culture adequate volume Performed at [place] [address], [place], [place] [address]    Culture   Final    NO GROWTH 1 DAY Performed at [facility], [place] [address] [place], [place] [address]    Report Status PENDING   Incomplete  MRSA PCR Screening     Status: None   Collection Time: 04/09/19  4:08 PM  Result Value Ref Range Status   MRSA by PCR NEGATIVE NEGATIVE Final    Comment:        The GeneXpert MRSA Assay (FDA approved for NASAL specimens only), is one component of a comprehensive MRSA colonization surveillance program. It  is not intended to diagnose MRSA infection nor to guide or monitor treatment for MRSA infections. Performed at [facility], [place] [address], [place], [place] [address]          Radiology Studies: Ct Chest W Contrast  Result Date: 04/09/2019 CLINICAL DATA:  History of metastatic non-small cell carcinoma of the right lung involving the bronchus intermedius with lymph node metastasis to the subcarinal region. Prior radiation and noncompliance with chemotherapy. Admitted with cough, fever and shortness of breath. Chest x-ray demonstrates right lower lung consolidation with possible component of pleural fluid. EXAM: CT CHEST WITH CONTRAST TECHNIQUE: Multidetector CT imaging of the chest was performed during intravenous contrast administration. CONTRAST:  37mL OMNIPAQUE IOHEXOL 300 MG/ML  SOLN COMPARISON:  Chest x-ray on 04/08/2019, PET scans on 11/21/2018 and 09/04/2018 and CT of the chest on 08/06/2018 FINDINGS: Cardiovascular: The heart size is normal. No pericardial fluid identified. There is calcified coronary artery plaque in a 3 vessel distribution. Scattered calcified plaque is noted in the thoracic aorta without evidence of aneurysmal disease. Proximal visualized great vessels demonstrate approximately 50% stenosis at the origin of the left subclavian artery. Central pulmonary arteries are normal in caliber. Mediastinum/Nodes: Progressive lymphadenopathy noted since prior PET scans. Enlarged and necrotic subcarinal lymph node mass demonstrating peripheral enhancement now measures approximately 3.5 cm in short axis compared to approximately 2.5 cm at a  similar level on the most recent PET scan last November. Adjacent tumor and lymph node spread now extends into the right hilum and right mediastinum and is difficult to distinguish from contiguous tumor extending along bronchovascular pathways into the right lower lobe and right middle lobe bronchus. Lungs/Pleura: The bronchus intermedius is now completely obstructed by enhancing tumor and it is suspected that the entire right middle lobe is likely infiltrated by tumor and is non aerated. The right middle lobe bronchus is also completely obstructed by tumor. The right upper lobe remains aerated. Maximum tumor dimensions are difficult to measure. Assuming extensive right lower lobe involvement, tumor may measure as much as 10 cm in estimated diameter. There also is suspected pleural tumor with component of basilar sub pulmonic pleural effusion and a partially loculated superior and lateral component of pleural fluid. Overall pleural fluid volume is small to moderate. No nodules or masses are seen in the contralateral left lung. There is stable emphysema. Calcified pleural plaques are present bilaterally. Density at the left lung base appears largely chronic and likely relates to chronic scarring. Upper Abdomen: No obvious metastatic disease in the visualized upper abdomen. The liver shows steatosis. Musculoskeletal: No obvious bony metastatic lesions or fractures. IMPRESSION: 1. Extensive progression of lung carcinoma in the right chest with complete occlusion of the bronchus intermedius and tumor extending throughout the bronchial tree of the right lower lobe and right middle lobe. It is suspected that the entire right lower lobe is involved with tumor and maximal tumor dimensions are approximately 10 cm. There is also more extensive metastatic lymph node involvement with larger subcarinal lymph node mass measuring 3.5 cm and contiguous tumor extending into the right hilum and elsewhere in the mediastinum. 2.  Pleural metastatic disease is also suspected with a small to moderate component currently of pleural fluid along the base of the hemithorax and also a component of probable partially loculated fluid laterally and towards the posterior apex. 3. Coronary atherosclerosis with calcified plaque in a 3 vessel distribution. Electronically Signed   By: Aletta Edouard M.D.   On: 04/09/2019 11:06  Dg Chest Port 1 View  Result Date: 04/08/2019 CLINICAL DATA:  Worsening shortness of breath and fever. History of lung cancer on chemotherapy. EXAM: PORTABLE CHEST 1 VIEW COMPARISON:  PET-CT 11/21/2018. Chest radiograph 08/12/2018. FINDINGS: A right jugular Port-A-Cath remains in place terminating over the lower SVC. Aortic atherosclerosis is noted the cardiomediastinal silhouette is unchanged from the prior radiograph. Underlying emphysema is again seen as well as evidence of some right lung volume loss. There is a small to moderate right pleural effusion which has enlarged from the prior radiograph and is new from the interval CT. Increasing airspace and interstitial opacity are present throughout the right lung base and right mid lung. Known calcified pleural plaques are most conspicuous in the left mid hemithorax. No acute osseous abnormality is seen. IMPRESSION: Enlarging, small to moderate-sized right pleural effusion with worsening right lung airspace and interstitial opacities concerning for pneumonia. Asymmetric edema and lymphangitic tumor are also possible. Electronically Signed   By: Logan Bores M.D.   On: 04/08/2019 18:43        Scheduled Meds: . enoxaparin (LOVENOX) injection  40 mg Subcutaneous Q24H  . mirabegron ER  25 mg Oral Daily  . montelukast  10 mg Oral QHS  . oxyCODONE  20 mg Oral Q12H  . pantoprazole  40 mg Oral Daily   Continuous Infusions:   LOS: 2 days     Georgette Shell, MD [facility]  If 7PM-7AM, please contact night-coverage [email or web address] Password [facility]  04/10/2019, 12:07 PM

## 2019-04-11 MED ORDER — SENNOSIDES-DOCUSATE SODIUM 8.6-50 MG PO TABS
3.0000 | ORAL_TABLET | Freq: Every day | ORAL | Status: DC
  Administered 2019-04-11: 3 via ORAL
  Filled 2019-04-11: qty 3

## 2019-04-11 MED ORDER — BISACODYL 5 MG PO TBEC
10.0000 mg | DELAYED_RELEASE_TABLET | Freq: Every day | ORAL | Status: DC
  Administered 2019-04-11 – 2019-04-12 (×2): 10 mg via ORAL
  Filled 2019-04-11 (×2): qty 2

## 2019-04-11 NOTE — TOC Progression Note (Signed)
Transition of Care [facility]) - Progression Note    Patient Details  Name: Ryan Houston MRN: [id number] Date of Birth: 1946/01/22  Transition of Care [facility]) CM/SW [place], LCSW Phone Number: 04/11/2019, 10:43 AM  Clinical Narrative:   Clinical Social Worker following patient for support and discharge needs. Patient is being followed by [facility] for placement in there facility. CSW spoke to [employer] for [facility] and she stated patient can come to facility regardless of negative or positive Covid text. Olivia Mackie stated patient will just need results of test back before coming so facility so [facility] can properly have appropriate PPEs for staff. CSW will continue to follow for support and discharge needs pending result of Covid Test    Expected Discharge Plan: [place] Barriers to Discharge: No Barriers Identified  Expected Discharge Plan and Services Expected Discharge Plan: [place] Choice: Hospice Living arrangements for the past 2 months: Single Family Home Expected Discharge Date: 04/10/19                         Social Determinants of Health (SDOH) Interventions    Readmission Risk Interventions No flowsheet data found.

## 2019-04-11 NOTE — Progress Notes (Signed)
PROGRESS NOTE    Ryan Houston  BDZ:[id number] DOB: [birth year] DOA: 04/08/2019 PCP: Rogers Blocker, MD   Brief Narrative:72 y.o.malewith medical history significant ofmalignant neoplasm of the right bronchus, GERD, hepatitis C, hypertension, iron deficiency anemia who presents with progressive shortness of breath and cough for the last 3 days.Patient reports recurrent symptoms on and offthat has gotten worse now. He also had a fever yesterday up to 102. He has been feeling nauseated and had one episode of vomiting. Patient is on immunotherapy but could not go due to generalized weakness and shortness of breath so he came to the ER to be evaluated. He is on 2 L of oxygen at home but now requiring 4 L here. He denied any abdominal pain. Denied any contact with somebody having [place].In the ER patient's oxygen sat was 88% on his baseline. Currently 96% on 2 L. He has been in and out of chemo and immunotherapy hence is considered imminent compromise. Low risk for COVID-19 infection he is being admitted with pneumonia based on chest x-ray findings..  ED Course:Temperature is 98.9 blood pressure 91/59 pulse 114 respiratory 26 oxygen sat 88% on 2 L. Sodium 132 potassium 4.0 chloride 98 CO2 of 28 glucose 148. Albumin is 2.2. White count 11.1 hemoglobin 11.4 and platelets 269. Relatively normal differentials. Chest x-ray showed enlarging small-to-moderate sized right pleural effusion with worsening right lung airspace opacities concerning for pneumonia.Patient initiated on antibiotics and being admitted for work-up.  Assessment & Plan:   Principal Problem:   Pneumonia Active Problems:   Chronic hepatitis C without hepatic coma (HCC)   GERD   Malnutrition of moderate degree   Primary malignant neoplasm of bronchus of right lower lobe (HCC)   #1 right lung pneumonia with pleural effusion: Most likely postobstructive pneumonia with malignant pleural effusion. Patient will be  admitted initiated on antibiotics. Blood cultures have been sent and sputum cultures to be obtained.CT of the chest shows Extensive progression of lung carcinoma in the right chest with complete occlusion of the bronchus intermedius and tumor extending throughout the bronchial tree of the right lower lobe and right middle lobe. It is suspected that the entire right lower lobe is involved with tumor and maximal tumor dimensions are approximately 10 cm.There is also more extensive metastatic lymph node involvement with larger subcarinal lymph node mass measuring 3.5 cm and contiguous tumor extending into the right hilum and elsewhere in the mediastinum. Pleural metastatic disease is also suspected with a small to moderate component currently of pleural fluid along the base of the hemithorax and also a component of probable partially loculated fluid laterally and towards the posterior apex. Coronary atherosclerosis with calcified plaque in a 3 vessel distribution.  [facility] cannot [facility] him before Covid test is back. Patient is a low risk for COVID-19 but will be placed on droplet isolation with rule out.  #2 lung cancer:Appreciate Dr. Antonieta Pert input  #3 chronic hepatitis C:Has not been treated in the past  #4 GERD:Continue with PPIs.  #5 moderate malnutrition:Encourage intake. Patient looks cachectic    DVT prophylaxis:Lovenox Code Status:Full code Family Communication: Disposition Plan:To be determined Consults called:Oncology  Estimated body mass index is 19.05 kg/m as calculated from the following:   Height as of this encounter: 5\' 9"  (1.753 m).   Weight as of this encounter: 58.5 kg.     Subjective:  Patient is resting in bed waiting for breakfast no complaints he told the nursing staff he has constipation Objective: Vitals:  04/09/19 1313 04/10/19 1300 04/10/19 2200 04/11/19 0528  BP: 93/64 92/61 112/67 112/68  Pulse: 96 91 89 88   Resp: 18 20 16 15   Temp: 98.2 F (36.8 C) 98.1 F (36.7 C) 98.4 F (36.9 C) 98.5 F (36.9 C)  TempSrc: Oral Oral Axillary Oral  SpO2: 99% 93% 96% 96%  Weight:      Height:        Intake/Output Summary (Last 24 hours) at 04/11/2019 1122 Last data filed at 04/11/2019 0522 Gross per 24 hour  Intake -  Output 2550 ml  Net -2550 ml   Filed Weights   04/08/19 1651  Weight: 58.5 kg    Examination: Cachectic looking elderly black male in no acute distress General exam: Appears calm and comfortable  Respiratory system: Clear to auscultation. Respiratory effort normal. Cardiovascular system: S1 & S2 heard, RRR. No JVD, murmurs, rubs, gallops or clicks. No pedal edema. Gastrointestinal system: Abdomen is nondistended, soft and nontender. No organomegaly or masses felt. Normal bowel sounds heard. Central nervous system: Alert and oriented. No focal neurological deficits. Extremities: Symmetric 5 x 5 power. Skin: No rashes, lesions or ulcer   Data Reviewed: I have personally reviewed following labs and imaging studies  CBC: Recent Labs  Lab 04/08/19 1912 04/09/19 0202 04/10/19 0351  WBC 11.1* 12.2* 11.7*  NEUTROABS 8.5* 9.9*  --   HGB 11.4* 11.2* 10.7*  HCT 35.3* 34.8* 33.4*  MCV 93.9 94.6 94.4  PLT 269 269 563   Basic Metabolic Panel: Recent Labs  Lab 04/08/19 1812 04/09/19 0202 04/10/19 0351  NA 132* 129* 130*  K 4.0 3.5 3.8  CL 98 98 98  CO2 28 25 26   GLUCOSE 148* 141* 184*  BUN 12 11 10   CREATININE 0.85 0.85  0.93 0.80  CALCIUM 8.2* 7.7* 8.0*   GFR: Estimated Creatinine Clearance: 68 mL/min (by C-G formula based on SCr of 0.8 mg/dL). Liver Function Tests: Recent Labs  Lab 04/08/19 1812 04/09/19 0202  AST 28 25  ALT 15 15  ALKPHOS 70 59  BILITOT 0.4 0.5  PROT 7.7 6.9  ALBUMIN 2.2* 1.9*   Recent Labs  Lab 04/08/19 1812  LIPASE 33   No results for input(s): AMMONIA in the last 168 hours. Coagulation Profile: No results for input(s): INR,  PROTIME in the last 168 hours. Cardiac Enzymes: Recent Labs  Lab 04/08/19 1812  TROPONINI <0.03   BNP (last 3 results) No results for input(s): PROBNP in the last 8760 hours. HbA1C: No results for input(s): HGBA1C in the last 72 hours. CBG: No results for input(s): GLUCAP in the last 168 hours. Lipid Profile: No results for input(s): CHOL, HDL, LDLCALC, TRIG, CHOLHDL, LDLDIRECT in the last 72 hours. Thyroid Function Tests: No results for input(s): TSH, T4TOTAL, FREET4, T3FREE, THYROIDAB in the last 72 hours. Anemia Panel: No results for input(s): VITAMINB12, FOLATE, FERRITIN, TIBC, IRON, RETICCTPCT in the last 72 hours. Sepsis Labs: No results for input(s): PROCALCITON, LATICACIDVEN in the last 168 hours.  Recent Results (from the past 240 hour(s))  Urine culture     Status: Abnormal   Collection Time: 04/08/19  5:06 PM  Result Value Ref Range Status   Specimen Description   Final    URINE, CLEAN CATCH Performed at [facility], [place] [address] [place], [place] [address]    Special Requests   Final    NONE Performed at [facility] [place] [address] [place], [place] [address]    Culture MULTIPLE  SPECIES PRESENT, SUGGEST RECOLLECTION (A)  Final   Report Status 04/09/2019 FINAL  Final  Blood Culture (routine x 2)     Status: None (Preliminary result)   Collection Time: 04/08/19  6:08 PM  Result Value Ref Range Status   Specimen Description   Final    BLOOD Performed at [place] [address], [place], [place] [address]    Special Requests   Final    BOTTLES DRAWN AEROBIC AND ANAEROBIC Blood Culture adequate volume Performed at [place] [address], [place], [place] [address]    Culture   Final    NO GROWTH 3 DAYS Performed at [facility], [place] [address] [place], [place] [address]    Report Status PENDING  Incomplete  Blood Culture (routine x 2)     Status: None  (Preliminary result)   Collection Time: 04/08/19  6:08 PM  Result Value Ref Range Status   Specimen Description   Final    BLOOD SITE NOT SPECIFIED Performed at [facility], [place] [address], [place], [place] [address]    Special Requests   Final    BOTTLES DRAWN AEROBIC AND ANAEROBIC Blood Culture adequate volume Performed at [place] [address] [place], [place] [address]    Culture   Final    NO GROWTH 3 DAYS Performed at [facility], [place] [address] [place], [place] [address]    Report Status PENDING  Incomplete  Culture, blood (routine x 2) Call MD if unable to obtain prior to antibiotics being given     Status: None (Preliminary result)   Collection Time: 04/09/19  2:02 AM  Result Value Ref Range Status   Specimen Description   Final    BLOOD LEFT ARM Performed at [place] [address] [place], [place] [address]    Special Requests   Final    BOTTLES DRAWN AEROBIC AND ANAEROBIC Blood Culture adequate volume Performed at [place] [address] [place], [place] [address]    Culture   Final    NO GROWTH 2 DAYS Performed at [place] [address] [place], [place] [address]    Report Status PENDING  Incomplete  Culture, blood (routine x 2) Call MD if unable to obtain prior to antibiotics being given     Status: None (Preliminary result)   Collection Time: 04/09/19  2:02 AM  Result Value Ref Range Status   Specimen Description   Final    BLOOD RIGHT ARM Performed at [facility], [place] [address] [place], [place] [address]    Special Requests   Final    BOTTLES DRAWN AEROBIC AND ANAEROBIC Blood Culture adequate volume Performed at [place] [address] [place], [place] [address]    Culture   Final    NO GROWTH 2 DAYS Performed at [place] [address], [place], [place] [address]    Report Status PENDING  Incomplete  MRSA  PCR Screening     Status: None   Collection Time: 04/09/19  4:08 PM  Result Value Ref Range Status   MRSA by PCR NEGATIVE NEGATIVE Final    Comment:        The GeneXpert MRSA Assay (FDA approved for NASAL specimens only), is one component of a comprehensive MRSA colonization surveillance program. It is not intended to diagnose MRSA infection nor to guide  or monitor treatment for MRSA infections. Performed at [facility], [place] [address] [place], [place] [address]          Radiology Studies: No results found.      Scheduled Meds: . bisacodyl  10 mg Oral Daily  . enoxaparin (LOVENOX) injection  40 mg Subcutaneous Q24H  . mirabegron ER  25 mg Oral Daily  . montelukast  10 mg Oral QHS  . oxyCODONE  20 mg Oral Q12H  . pantoprazole  40 mg Oral Daily  . senna-docusate  3 tablet Oral QHS   Continuous Infusions:   LOS: 3 days     Georgette Shell, MD [facility]  If 7PM-7AM, please contact night-coverage [email or web address] Password [facility] 04/11/2019, 11:22 AM

## 2019-04-12 LAB — NOVEL CORONAVIRUS, NAA (HOSP ORDER, SEND-OUT TO REF LAB; TAT 18-24 HRS): SARS-CoV-2, NAA: NOT DETECTED

## 2019-04-12 NOTE — TOC Transition Note (Signed)
Transition of Care [facility]) - CM/SW Discharge Note   Patient Details  Name: MADDOCK FINIGAN MRN: [id number] Date of Birth: 1946-11-21  [facility] CM/SW Contact:  Wende Neighbors, LCSW Phone Number: 04/12/2019, 9:27 AM   Clinical Narrative:   Patient going to [facility] via [facility] [place]. Patients brother is aware of discharge to facility. RN to please call [phone] prior to patients discharge.     Final next level of care: [place] Barriers to Discharge: No Barriers Identified   Patient Goals and CMS Choice   CMS Medicare.gov Compare Post Acute Care list provided to:: Patient Choice offered to / list presented to : Patient  Discharge Placement              Patient chooses bed at: [facility]) Patient to be transferred to facility by: [place] Name of family member notified: brother BJ Bruinsma aware of dc Patient and family notified of of transfer: 04/12/19  Discharge Plan and Services     Post Acute Care Choice: Hospice                    Social Determinants of Health (SDOH) Interventions     Readmission Risk Interventions No flowsheet data found.

## 2019-04-12 NOTE — Progress Notes (Signed)
Patient discharged to [facility], reports called to receiving RN. [facility] at bedside to transport pt.

## 2019-04-13 LAB — CULTURE, BLOOD (ROUTINE X 2)
Culture: NO GROWTH
Culture: NO GROWTH
Special Requests: ADEQUATE
Special Requests: ADEQUATE

## 2019-04-14 LAB — CULTURE, BLOOD (ROUTINE X 2)
Culture: NO GROWTH
Culture: NO GROWTH
Special Requests: ADEQUATE
Special Requests: ADEQUATE

## 2019-04-22 ENCOUNTER — Other Ambulatory Visit: Payer: Medicare Other

## 2019-04-22 ENCOUNTER — Ambulatory Visit: Payer: Medicare Other

## 2019-04-22 ENCOUNTER — Ambulatory Visit: Payer: Medicare Other | Admitting: Hematology & Oncology

## 2019-04-23 ENCOUNTER — Encounter: Payer: Medicare Other | Admitting: Family

## 2019-04-23 ENCOUNTER — Telehealth: Payer: Self-pay | Admitting: Pharmacy Technician

## 2019-05-01 NOTE — Telephone Encounter (Signed)
[facility] Patient Advocate Encounter    Findings of the benefits investigation conducted this morning via test claims for the patient's upcoming appointment today are as follows:  Insurance: [employer] active status Estimated copay amount will be determined once labs return and medication selected Prior Authorization: will begin insurance process once medication is prescribed Insurance [id number]  Ryan [place] Patient [facility] Phone: [phone] Fax:  [phone]

## 2019-05-01 DEATH — deceased

## 2019-05-21 MED ORDER — ONDANSETRON HCL 8 MG PO TABS
ORAL_TABLET | ORAL | Status: AC
  Filled 2019-05-21: qty 1

## 2019-06-05 ENCOUNTER — Other Ambulatory Visit: Payer: Self-pay | Admitting: *Deleted

## 2019-06-05 NOTE — Telephone Encounter (Signed)
Received request from [facility] for a refill on patient's singulair.  Patient passed away at [facility] on 2019/05/03. Called [facility] and notified them.

## 2020-03-10 ENCOUNTER — Other Ambulatory Visit: Payer: Self-pay | Admitting: Family

## 2020-03-26 IMAGING — CT CT CHEST W/ CM
2 of 5 series · 12 of 36 positions shown, 15 images · IV contrast (iopamidol)
Comparison: Chest radiographs obtained earlier today. Chest CT
report dated 07/23/2018 from [REDACTED].

CLINICAL DATA: Hemoptysis for the past 2 weeks. Shortness of breath
on exertion. Chest pain. Low back pain. Smoker. Large subcarinal
mass, right hilar adenopathy, bilateral pleural plaques and possible
left renal mass on a recent chest CT at [REDACTED], dated
07/23/2018.

EXAM:
CT CHEST, ABDOMEN, AND PELVIS WITH CONTRAST
TECHNIQUE: Multidetector CT imaging of the chest, abdomen and pelvis was
performed following the standard protocol during bolus
administration of intravenous contrast.
CONTRAST:  100mL POBRAE-LAA IOPAMIDOL (POBRAE-LAA) INJECTION 61%

[Series 2: cap with 2 · axial · 0.82mm/px · z∈[-686,-171]mm · 9 of 127 slices shown, 12 images]
[im 12/127  mediastinal]
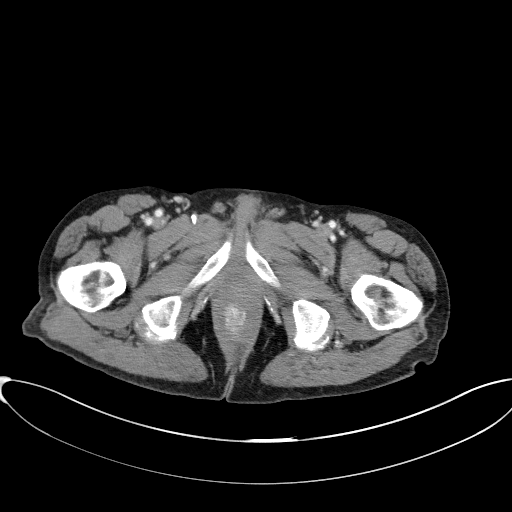
[im 12/127  lung]
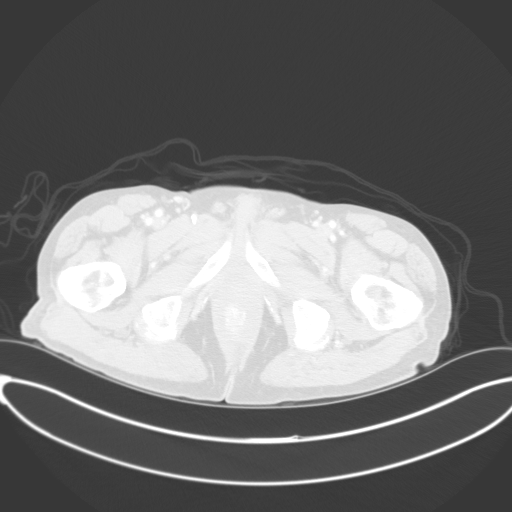
[im 23/127  lung]
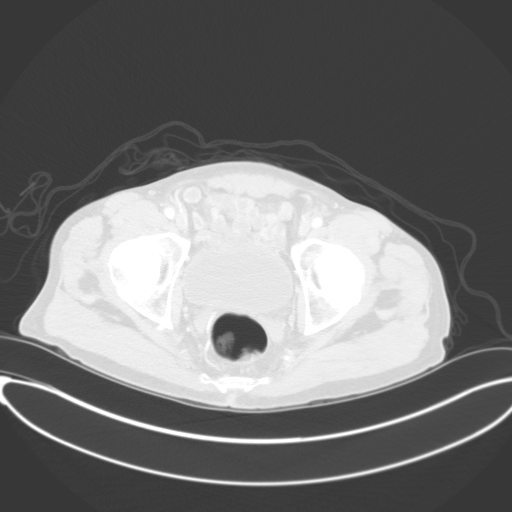
[im 35/127  lung]
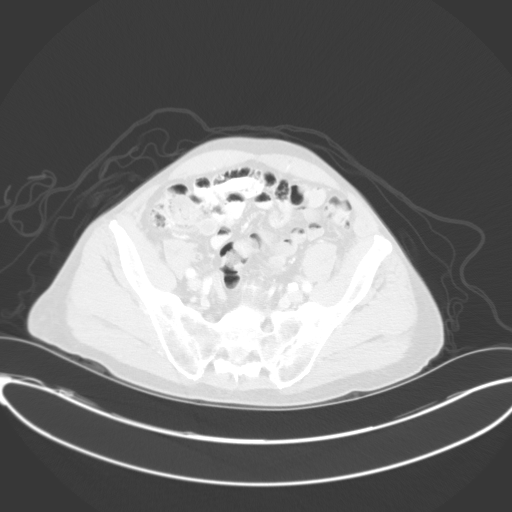
[im 46/127  lung]
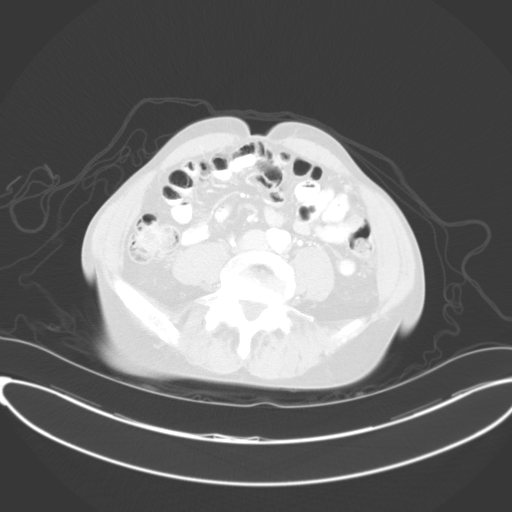
[im 69/127  mediastinal]
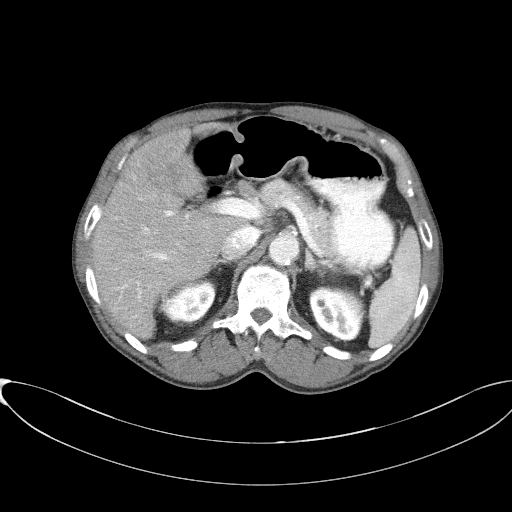
[im 69/127  lung]
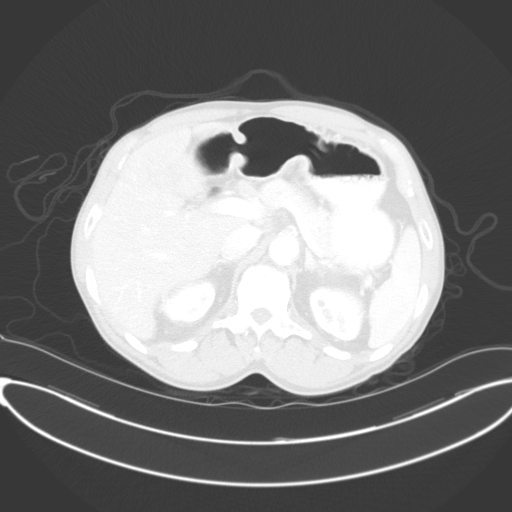
[im 81/127  lung]
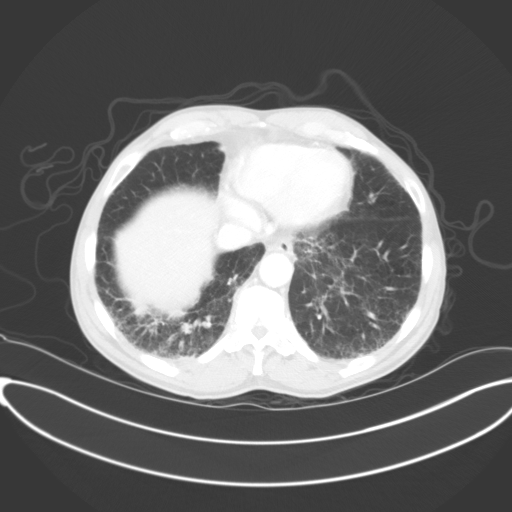
[im 92/127  lung]
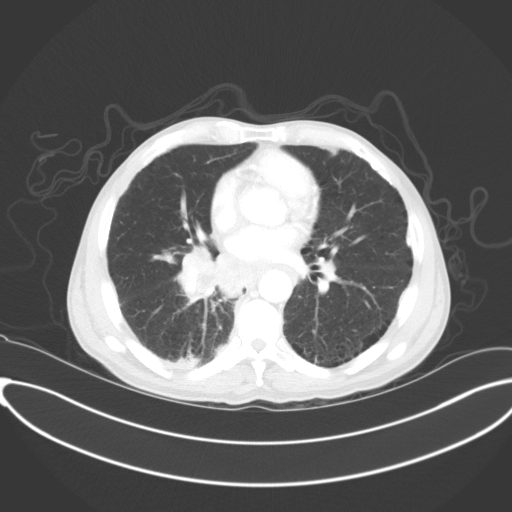
[im 104/127  lung]
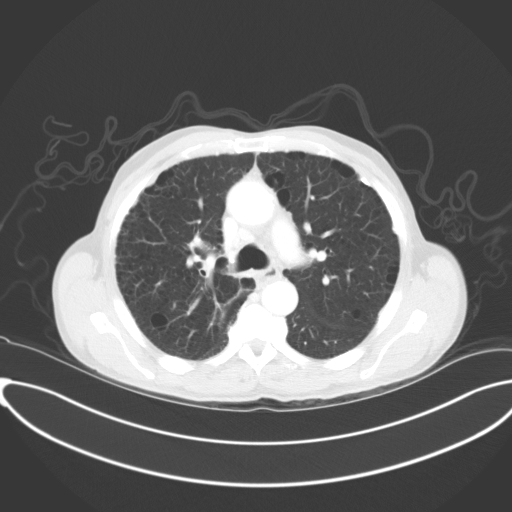
[im 115/127  mediastinal]
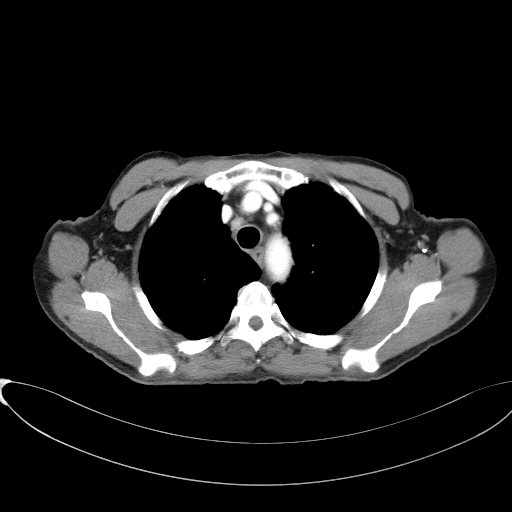
[im 115/127  lung]
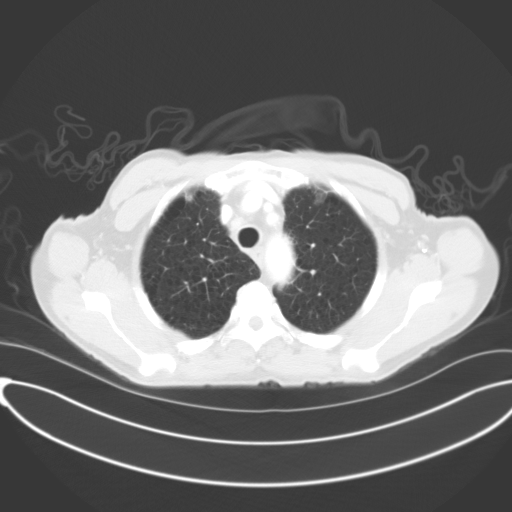

[Series 5: coronals · coronal · 0.73mm/px · 3 of 125 slices shown]
[im 25/125  lung]
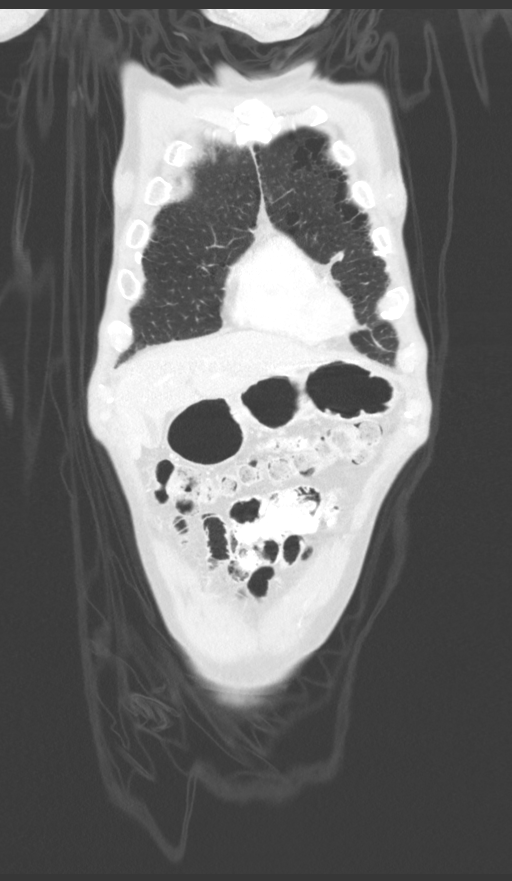
[im 50/125  lung]
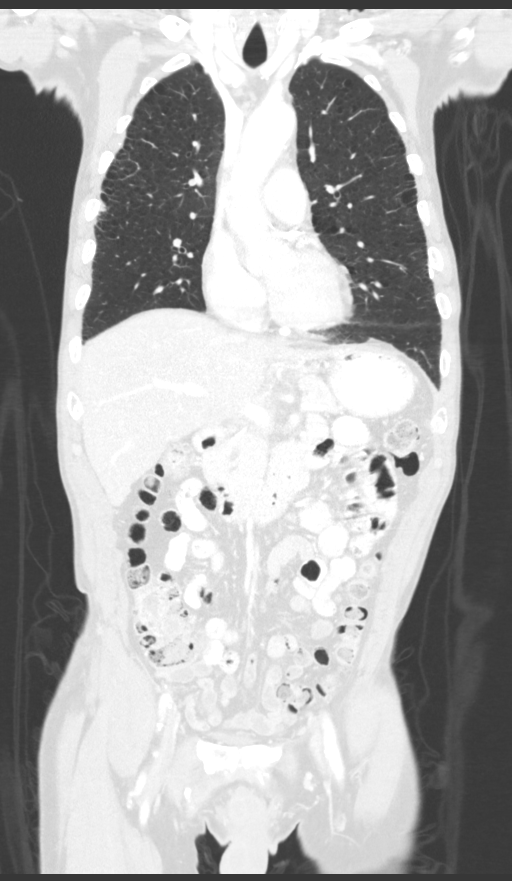
[im 75/125  lung]
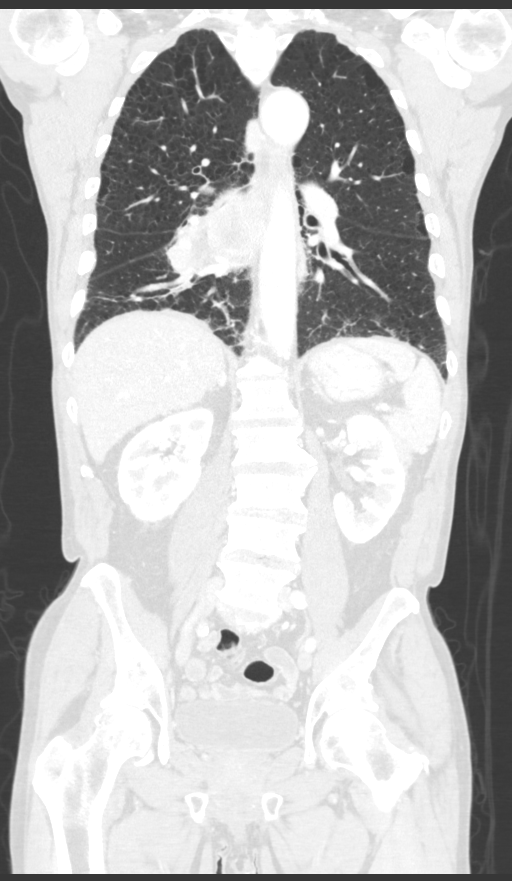

[12 of 36 positions shown; findings below may reference images not displayed]

FINDINGS: CT CHEST FINDINGS

Cardiovascular: Atheromatous calcifications, including the coronary
arteries and aorta.

Mediastinum/Nodes: Heterogeneous subcarinal mass invading and
occluding the right lower lobe bronchus and bronchus intermedius.
This mass measures 5.8 x 3.2 cm on image number 33 series 2. There
is irregular soft tissue density extending in the peribronchial
regions into the more peripheral aspects of the right lower lobe.

Similar-appearing enlarged right hilar lymph nodes. The largest has
a short axis diameter of 16 mm on image number 38 of series 2,
containing central low density.

Mildly enlarged precarinal node with a short axis diameter of 11 mm
on image number 23 series 2. Unremarkable included thyroid gland.

Lungs/Pleura: Bilateral calcified pleural plaques. Extensive
bilateral bullous changes. Mild right lower lobe atelectasis.

Musculoskeletal: Mild thoracic spine degenerative changes. No
evidence of bony metastatic disease.

CT ABDOMEN PELVIS FINDINGS

Hepatobiliary: Mild diffuse low density of the liver relative to the
spleen. Normal appearing gallbladder.

Pancreas: Unremarkable. No pancreatic ductal dilatation or
surrounding inflammatory changes.

Spleen: Normal in size without focal abnormality.

Adrenals/Urinary Tract: Small upper pole left renal cyst. Normal
appearing adrenal glands, right kidney, ureters and urinary bladder.

Stomach/Bowel: Stomach is within normal limits. Appendix appears
normal. No evidence of bowel wall thickening, distention, or
inflammatory changes.

Vascular/Lymphatic: Atheromatous arterial calcifications without
aneurysm. No enlarged lymph nodes.

Reproductive: Moderately enlarged prostate gland.

Other: Small right inguinal hernia containing fat and portion of a
loop of small bowel without obstruction. Small left inguinal hernia
containing fat.

Musculoskeletal: Mild left and minimal right hip degenerative
changes. Mild levoconvex lumbar scoliosis. Lumbar spine degenerative
changes. No evidence of bony metastatic disease.
IMPRESSION: 1. 5.8 x 3.2 cm subcarinal and medial right lower lobe lung mass
invading and occluding the right lower lobe bronchus and bronchus
intermedius. This is most compatible with a primary lung neoplasm.
2. Metastatic right hilar and precarinal adenopathy.
3. Probable tumor extending along the bronchi into the more
peripheral portions of the right lower lobe.
4. Mild right lower lobe atelectasis.
5. Centrilobular and paraseptal emphysema.
6. Bilateral calcified pleural plaques, compatible with previous
asbestos exposure.
7.  Calcific coronary artery and aortic atherosclerosis.
8. Mild diffuse hepatic steatosis.
9. Moderate prostatic hypertrophy.
10. Small right inguinal hernia containing a portion of a small
bowel loop without obstruction

## 2020-03-28 IMAGING — XA IR FLUORO GUIDE CV LINE*L*
1 series · 1 of 1 positions shown · non-contrast
Comparison: none

CLINICAL DATA: Lung mass, needs durable venous access for planned
chemotherapy regimen.
TECHNIQUE: The procedure, risks, benefits, and alternatives were explained to
the patient. Questions regarding the procedure were encouraged and
answered. The patient understands and consents to the procedure. As
antibiotic prophylaxis, cefazolin 2 g was ordered pre-procedure and
administered intravenously within one hour of incision. Patency of
the right IJ vein was confirmed with ultrasound with image
documentation. An appropriate skin site was determined. Skin site
was marked. Region was prepped using maximum barrier technique
including cap and mask, sterile gown, sterile gloves, large sterile
sheet, and Chlorhexidine as cutaneous antisepsis. The region was
infiltrated locally with 1% lidocaine. Under real-time ultrasound
guidance, the right IJ vein was accessed with a 21 gauge
micropuncture needle; the needle tip within the vein was confirmed
with ultrasound image documentation. Needle was exchanged over a 018
guidewire for transitional dilator which allowed passage of the
Benson wire into the IVC. Over this, the transitional dilator was
exchanged for a 5 French MPA catheter. A small incision was made on
the right anterior chest wall and a subcutaneous pocket fashioned.
The power-injectable port was positioned and its catheter tunneled
to the right IJ dermatotomy site. The MPA catheter was exchanged
over an Amplatz wire for a peel-away sheath, through which the port
catheter, which had been trimmed to the appropriate length, was
advanced and positioned under fluoroscopy with its tip at the
cavoatrial junction. Spot chest radiograph confirms good catheter
position and no pneumothorax. The pocket was closed with deep
interrupted and subcuticular continuous 3-0 Monocryl sutures. The
port was flushed per protocol. The incisions were covered with
Dermabond then covered with a sterile dressing.

COMPLICATIONS:
COMPLICATIONS
None immediate

[Series 300: line placements · 1 of 1 slices shown]
[im 1/1]
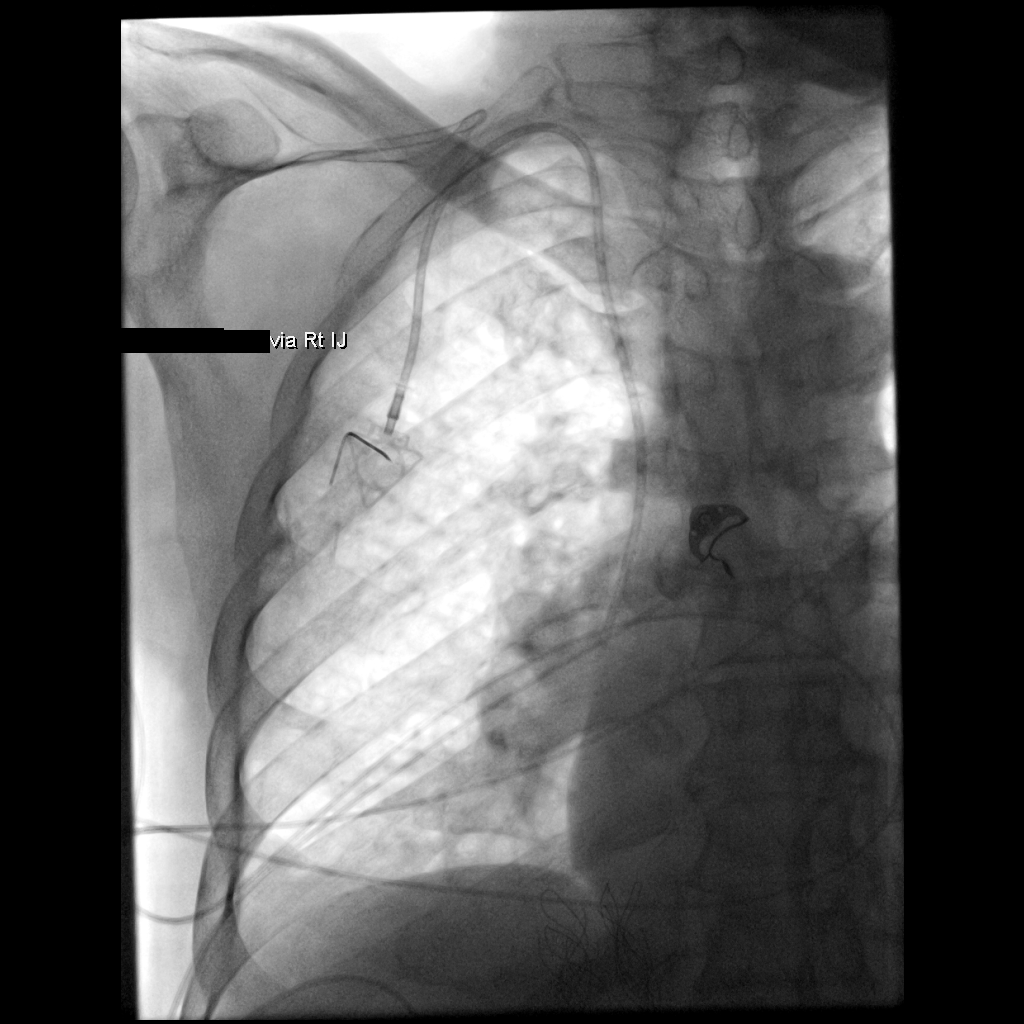

[1 of 1 positions shown; findings below may reference images not displayed]

EXAM:
TUNNELED PORT CATHETER PLACEMENT WITH ULTRASOUND AND FLUOROSCOPIC
GUIDANCE

FLUOROSCOPY TIME:  Less than 0.1 minute; 5  uLym5 DAP

ANESTHESIA/SEDATION:
Intravenous Fentanyl and Versed were administered as conscious
sedation during continuous monitoring of the patient's level of
consciousness and physiological / cardiorespiratory status by the
radiology RN, with a total moderate sedation time of 15 minutes.
IMPRESSION: Technically successful right IJ power-injectable port catheter
placement. Ready for routine use.
